# Patient Record
Sex: Female | Born: 1960
Health system: Southern US, Community
[De-identification: ages and names within clinical notes are randomized; demographics above are authoritative.]

## PROBLEM LIST (undated history)

## (undated) DIAGNOSIS — I10 Essential (primary) hypertension: Secondary | ICD-10-CM

## (undated) DIAGNOSIS — C801 Malignant (primary) neoplasm, unspecified: Secondary | ICD-10-CM

## (undated) DIAGNOSIS — O09899 Supervision of other high risk pregnancies, unspecified trimester: Secondary | ICD-10-CM

## (undated) HISTORY — DX: Malignant (primary) neoplasm, unspecified: C80.1

## (undated) HISTORY — PX: BREAST BIOPSY: SHX20

## (undated) HISTORY — DX: Essential (primary) hypertension: I10

## (undated) HISTORY — PX: CATARACT EXTRACTION BILATERAL W/ ANTERIOR VITRECTOMY: SHX1304

## (undated) HISTORY — PX: CHOLECYSTECTOMY: SHX55

## (undated) HISTORY — DX: Supervision of other high risk pregnancies, unspecified trimester: O09.899

---

## 1996-06-24 HISTORY — PX: BREAST EXCISIONAL BIOPSY: SUR124

## 2000-06-24 DIAGNOSIS — O09899 Supervision of other high risk pregnancies, unspecified trimester: Secondary | ICD-10-CM

## 2000-06-24 HISTORY — DX: Supervision of other high risk pregnancies, unspecified trimester: O09.899

## 2000-07-14 ENCOUNTER — Inpatient Hospital Stay (HOSPITAL_COMMUNITY): Admission: AD | Admit: 2000-07-14 | Discharge: 2000-07-18 | Payer: Self-pay | Admitting: *Deleted

## 2000-07-19 ENCOUNTER — Encounter: Admission: RE | Admit: 2000-07-19 | Discharge: 2000-10-02 | Payer: Self-pay | Admitting: *Deleted

## 2003-06-25 HISTORY — PX: BREAST BIOPSY: SHX20

## 2004-06-12 ENCOUNTER — Ambulatory Visit: Payer: Self-pay | Admitting: Unknown Physician Specialty

## 2004-06-24 DIAGNOSIS — C801 Malignant (primary) neoplasm, unspecified: Secondary | ICD-10-CM

## 2004-06-24 HISTORY — DX: Malignant (primary) neoplasm, unspecified: C80.1

## 2005-01-25 ENCOUNTER — Ambulatory Visit: Payer: Self-pay | Admitting: Surgery

## 2005-12-30 ENCOUNTER — Ambulatory Visit: Payer: Self-pay | Admitting: Unknown Physician Specialty

## 2006-02-04 ENCOUNTER — Ambulatory Visit: Payer: Self-pay | Admitting: Surgery

## 2007-02-06 ENCOUNTER — Ambulatory Visit: Payer: Self-pay | Admitting: Unknown Physician Specialty

## 2007-02-19 ENCOUNTER — Ambulatory Visit: Payer: Self-pay | Admitting: Surgery

## 2007-10-09 ENCOUNTER — Ambulatory Visit: Payer: Self-pay | Admitting: Unknown Physician Specialty

## 2008-02-23 ENCOUNTER — Ambulatory Visit: Payer: Self-pay | Admitting: Surgery

## 2008-07-13 ENCOUNTER — Ambulatory Visit: Payer: Self-pay | Admitting: Unknown Physician Specialty

## 2008-07-22 ENCOUNTER — Ambulatory Visit: Payer: Self-pay | Admitting: Unknown Physician Specialty

## 2008-10-21 ENCOUNTER — Ambulatory Visit: Payer: Self-pay | Admitting: Unknown Physician Specialty

## 2009-02-24 ENCOUNTER — Ambulatory Visit: Payer: Self-pay | Admitting: Unknown Physician Specialty

## 2009-10-04 ENCOUNTER — Ambulatory Visit: Payer: Self-pay | Admitting: Unknown Physician Specialty

## 2010-03-01 ENCOUNTER — Ambulatory Visit: Payer: Self-pay | Admitting: Unknown Physician Specialty

## 2010-11-06 LAB — HM PAP SMEAR: HM Pap smear: NORMAL

## 2011-03-14 ENCOUNTER — Ambulatory Visit: Payer: Self-pay | Admitting: Unknown Physician Specialty

## 2011-03-21 LAB — HM MAMMOGRAPHY: HM Mammogram: NORMAL

## 2011-11-14 ENCOUNTER — Ambulatory Visit (INDEPENDENT_AMBULATORY_CARE_PROVIDER_SITE_OTHER): Payer: PRIVATE HEALTH INSURANCE | Admitting: Internal Medicine

## 2011-11-14 ENCOUNTER — Encounter: Payer: Self-pay | Admitting: Internal Medicine

## 2011-11-14 VITALS — BP 136/96 | HR 100 | Temp 98.5°F | Resp 16 | Ht 63.5 in | Wt 189.5 lb

## 2011-11-14 DIAGNOSIS — C801 Malignant (primary) neoplasm, unspecified: Secondary | ICD-10-CM

## 2011-11-14 DIAGNOSIS — I10 Essential (primary) hypertension: Secondary | ICD-10-CM

## 2011-11-14 DIAGNOSIS — E669 Obesity, unspecified: Secondary | ICD-10-CM

## 2011-11-14 NOTE — Patient Instructions (Signed)
Consider the Low Glycemic Index Diet and 6 smaller meals daily .  This boosts your metabolism and regulates your sugars:   7 AM Low carbohydrate Protein  Shakes (EAS Carb Control  Or Atkins ,  Available everywhere,   In  cases at BJs )  2.5 carbs  (Add or substitute a toasted sandwhich thin w/ peanut butter)  10 AM: Protein bar by Atkins (snack size,  Chocolate lover's variety at  BJ's)    Lunch: sandwich on pita bread or flatbread (Joseph's makes a pita bread and a flat bread , available at [employer] and BJ's; Toufayah makes a low carb flatbread available at [employer] and [employer]) Mission makes a low carb whole wheat tortilla available at BJs,and most grocery stores   3 PM:  Mid day :  Another protein bar,  Or a  cheese stick, 1/4 cup of almonds, walnuts, pistachios, pecans, peanuts,  Macadamia nuts  6 PM  Dinner:  "mean and green:"  Meat/chicken/fish, salad, and green veggie : use ranch, vinagrette,  Blue cheese, etc  9 PM snack : Breyer's low carb fudgsicle or  ice cream bar (Carb Smart), or  Weight Watcher's ice cream bar , or another protein shake  

## 2011-11-14 NOTE — Progress Notes (Signed)
Patient ID: Kathryn Torres, female   DOB: 02-28-1961, 51 y.o.   MRN: [id number]  Patient Active Problem List  Diagnoses  . Previous pregnancy with hemolysis, elevated liver enzymes, and low platelet (HELLP) syndrome, antepartum  . Hypertension  . melanoma  . Obesity (BMI 30-39.9)    Subjective:  CC:   Chief Complaint  Patient presents with  . New Patient    HPI:   Kathryn Torres a 51 y.o. female who presents To establish care as a new patient.  Last physical may 2012 with Francia Greaves,  PAP was normal.   Mammogram due in Sept 2012 at [facility].  6 month follow up for hypertension last Nov  .  Cc is  post nasal drip causing a cough,  Last 3 weeks. 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  No history of asthma,  Reflux or smoking.  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   Diagnosed with hypertension in 2002, delivered at 30 weeks for HELLP syndrome  At [facility].  c section .  Checks bp at work and typically runs 120-130/70-82 .  Having periods every 21 days for the past 3 years.  Trial of progesterone only did not help,  prefers to stay hormone free,    Past Medical History  Diagnosis Date  . Previous pregnancy with hemolysis, elevated liver enzymes, and low platelet (HELLP) syndrome, antepartum 2002    s/p c section   . Hypertension   . melanoma 2006    lower back    Past Surgical History  Procedure Date  . Cesarean section 2002         The following portions of the patient's history were reviewed and updated as appropriate: Allergies, current medications, and problem list.    Review of Systems:   12 Pt  review of systems was negative except those addressed in the HPI,     History   Social History  . Marital Status: Married    Spouse Name: N/A    Number of Children: N/A  . Years of Education: N/A   Occupational History  . Not on file.   Social History Main Topics  . Smoking status: Never  Smoker   . Smokeless tobacco: Never Used  . Alcohol Use: No  . Drug Use: No  . Sexually Active: Not on file   Other Topics Concern  . Not on file   Social History Narrative  . No narrative on file    Objective:  BP 136/96  Pulse 100  Temp(Src) 98.5 F (36.9 C) (Oral)  Resp 16  Ht 5' 3.5" (1.613 m)  Wt 189 lb 8 oz (85.957 kg)  BMI 33.04 kg/m2  SpO2 96%  LMP 10/23/2011  General appearance: alert, cooperative and appears stated age Ears: normal TM's and external ear canals both ears Throat: lips, mucosa, and tongue normal; teeth and gums normal Neck: no adenopathy, no carotid bruit, supple, symmetrical, trachea midline and thyroid not enlarged, symmetric, no tenderness/mass/nodules Back: symmetric, no curvature. ROM normal. No CVA tenderness. Lungs: clear to auscultation bilaterally Heart: regular rate and rhythm, S1, S2 normal, no murmur, click, rub or gallop Abdomen: soft, non-tender; bowel sounds normal; no masses,  no organomegaly Pulses: 2+ and symmetric Skin: Skin color, texture, turgor normal. No rashes or lesions Lymph nodes: Cervical, supraclavicular, and axillary nodes normal.  Assessment and Plan:  Hypertension Not currently well controlled, but her readings at  work and home have been < 130/80.  No changes today  melanoma Continues to have biannual dermatology follow up with Diona Browner.   Obesity (BMI 30-39.9) I have addressed  BMI and recommended a low glycemic index diet utilizing smaller more frequent meals to increase metabolism.  I have also recommended that patient start exercising with a goal of 30 minutes of aerobic exercise a minimum of 5 days per week. Screening for lipid disorders, thyroid and diabetes to be done today.       Updated Medication List Outpatient Encounter Prescriptions as of 11/14/2011  Medication Sig Dispense Refill  . metoprolol succinate (TOPROL-XL) 25 MG 24 hr tablet Take 25 mg by mouth daily.      Marland Kitchen  triamterene-hydrochlorothiazide (MAXZIDE-25) 37.5-25 MG per tablet Take 1 tablet by mouth daily.         Orders Placed This Encounter  Procedures  . HM MAMMOGRAPHY  . HM PAP SMEAR    No Follow-up on file.

## 2011-11-15 ENCOUNTER — Telehealth: Payer: Self-pay | Admitting: Internal Medicine

## 2011-11-15 NOTE — Telephone Encounter (Signed)
Do not find this information in 05.23.13 OV Note; Please advise.

## 2011-11-15 NOTE — Telephone Encounter (Signed)
[facility] called and states boyfriend of patient was there to pick up medication that was suppose to be called in yesterday.  Please contact them with the prescriptions.  Pharmacy didn't know what the medication was suppose to be.

## 2011-11-18 ENCOUNTER — Encounter: Payer: Self-pay | Admitting: Internal Medicine

## 2011-11-18 DIAGNOSIS — I10 Essential (primary) hypertension: Secondary | ICD-10-CM | POA: Insufficient documentation

## 2011-11-18 DIAGNOSIS — Z85820 Personal history of malignant melanoma of skin: Secondary | ICD-10-CM | POA: Insufficient documentation

## 2011-11-18 DIAGNOSIS — E669 Obesity, unspecified: Secondary | ICD-10-CM | POA: Insufficient documentation

## 2011-11-18 DIAGNOSIS — O09899 Supervision of other high risk pregnancies, unspecified trimester: Secondary | ICD-10-CM | POA: Insufficient documentation

## 2011-11-18 NOTE — Assessment & Plan Note (Signed)
Continues to have biannual dermatology follow up with Diona Browner.

## 2011-11-18 NOTE — Assessment & Plan Note (Signed)
Not currently well controlled, but her readings at work and home have been < 130/80.  No changes today

## 2011-11-18 NOTE — Assessment & Plan Note (Signed)
I have addressed  BMI and recommended a low glycemic index diet utilizing smaller more frequent meals to increase metabolism.  I have also recommended that patient start exercising with a goal of 30 minutes of aerobic exercise a minimum of 5 days per week. Screening for lipid disorders, thyroid and diabetes to be done today.   

## 2011-11-19 NOTE — Telephone Encounter (Signed)
I do not know either, ,  Please call patient and ask her if we discussed an antibiotic

## 2011-11-22 NOTE — Telephone Encounter (Signed)
This was [place] requested from the pharmacy and the patient has not contacted the office for the medication.

## 2011-11-29 ENCOUNTER — Telehealth: Payer: Self-pay | Admitting: Internal Medicine

## 2011-11-29 MED ORDER — METOPROLOL SUCCINATE ER 25 MG PO TB24: 25.0000 mg | ORAL_TABLET | Freq: Every day | ORAL | Status: DC

## 2011-11-29 MED ORDER — TRIAMTERENE-HCTZ 37.5-25 MG PO TABS: 1.0000 | ORAL_TABLET | Freq: Every day | ORAL | Status: DC

## 2011-11-29 NOTE — Telephone Encounter (Signed)
Patient needing a refill on her Toprol-xl 25 mg and Maxzide 25 37.5-25 mg both in generic.

## 2012-03-02 ENCOUNTER — Telehealth: Payer: Self-pay | Admitting: Internal Medicine

## 2012-03-02 DIAGNOSIS — Z1239 Encounter for other screening for malignant neoplasm of breast: Secondary | ICD-10-CM

## 2012-03-02 NOTE — Telephone Encounter (Signed)
°  Pt called she received notice time for mammogram @ [place] last one was sept 20,2012 Pt would perfer am appointment

## 2012-03-24 ENCOUNTER — Ambulatory Visit: Payer: Self-pay | Admitting: Internal Medicine

## 2012-03-31 ENCOUNTER — Encounter: Payer: Self-pay | Admitting: Internal Medicine

## 2012-04-14 ENCOUNTER — Telehealth: Payer: Self-pay | Admitting: Internal Medicine

## 2012-04-14 NOTE — Telephone Encounter (Signed)
Request for metoprolol succ er 25 mg Sig: take 1 tablet (25 mg total) by mouth daily

## 2012-04-15 MED ORDER — METOPROLOL SUCCINATE ER 25 MG PO TB24: 25.0000 mg | ORAL_TABLET | Freq: Every day | ORAL | Status: DC

## 2012-04-15 NOTE — Telephone Encounter (Signed)
rx sent electronically. 

## 2012-05-15 ENCOUNTER — Ambulatory Visit: Payer: Self-pay | Admitting: Internal Medicine

## 2012-05-15 LAB — BASIC METABOLIC PANEL
BUN: 15 mg/dL (ref 7–18)
Chloride: 104 mmol/L (ref 98–107)
Co2: 29 mmol/L (ref 21–32)
Creatinine: 0.82 mg/dL (ref 0.60–1.30)
EGFR (African American): >60
Potassium: 4.1 mmol/L (ref 3.5–5.1)
Sodium: 139 mmol/L (ref 136–145)

## 2012-05-17 ENCOUNTER — Telehealth: Payer: Self-pay | Admitting: Internal Medicine

## 2012-05-17 NOTE — Telephone Encounter (Signed)
Basic metabolic panel is normal. Her triamterene HCTZ can be refilled.

## 2012-05-18 ENCOUNTER — Ambulatory Visit (INDEPENDENT_AMBULATORY_CARE_PROVIDER_SITE_OTHER): Payer: PRIVATE HEALTH INSURANCE | Admitting: Internal Medicine

## 2012-05-18 ENCOUNTER — Encounter: Payer: Self-pay | Admitting: Internal Medicine

## 2012-05-18 ENCOUNTER — Other Ambulatory Visit (HOSPITAL_COMMUNITY)
Admission: RE | Admit: 2012-05-18 | Discharge: 2012-05-18 | Disposition: A | Discharge status: Home / Self Care | Payer: PRIVATE HEALTH INSURANCE | Source: Ambulatory Visit | Attending: Internal Medicine | Admitting: Internal Medicine

## 2012-05-18 VITALS — BP 112/70 | HR 73 | Temp 98.7°F | Resp 12 | Ht 63.5 in | Wt 192.5 lb

## 2012-05-18 DIAGNOSIS — Z0001 Encounter for general adult medical examination with abnormal findings: Secondary | ICD-10-CM | POA: Insufficient documentation

## 2012-05-18 DIAGNOSIS — Z124 Encounter for screening for malignant neoplasm of cervix: Secondary | ICD-10-CM

## 2012-05-18 DIAGNOSIS — R1011 Right upper quadrant pain: Secondary | ICD-10-CM

## 2012-05-18 DIAGNOSIS — E669 Obesity, unspecified: Secondary | ICD-10-CM

## 2012-05-18 DIAGNOSIS — I10 Essential (primary) hypertension: Secondary | ICD-10-CM

## 2012-05-18 DIAGNOSIS — Z1151 Encounter for screening for human papillomavirus (HPV): Secondary | ICD-10-CM | POA: Insufficient documentation

## 2012-05-18 DIAGNOSIS — Z01419 Encounter for gynecological examination (general) (routine) without abnormal findings: Secondary | ICD-10-CM | POA: Insufficient documentation

## 2012-05-18 DIAGNOSIS — Z9049 Acquired absence of other specified parts of digestive tract: Secondary | ICD-10-CM | POA: Insufficient documentation

## 2012-05-18 DIAGNOSIS — Z0000 Encounter for general adult medical examination without abnormal findings: Secondary | ICD-10-CM

## 2012-05-18 DIAGNOSIS — K8020 Calculus of gallbladder without cholecystitis without obstruction: Secondary | ICD-10-CM

## 2012-05-18 NOTE — Progress Notes (Signed)
Patient ID: Kathryn Torres, female   DOB: 29-Jan-1961, 51 y.o.   MRN: [id number]  Subjective:     Kathryn Torres is a 51 y.o. female and is here for a comprehensive physical exam. The patient reports dismay that she has gained 3 lbs since last visit. 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  Not exercising or dieting currently due to hectic schedule and increased workload at [facility] [employer]. Typical schedule: she rises at 5:00,  in bed by 9:30 or 10:00 .  Has a husband and 45 yr old at home.  2) Recurrent severe post prandial pain, occurring every 3 weeks, more severe lately.  Has a history of gallstones by prior CT of abdomen done by former PCP Lin Givens.  ts to see Michela Pitcher to consider elective surgery.    History   Social History  . Marital Status: Married    Spouse Name: N/A    Number of Children: N/A  . Years of Education: N/A   Occupational History  . Not on file.   Social History Main Topics  . Smoking status: Never Smoker   . Smokeless tobacco: Never Used  . Alcohol Use: No  . Drug Use: No  . Sexually Active: Not on file   Other Topics Concern  . Not on file   Social History Narrative  . No narrative on file   Health Maintenance  Topic Date Due  . Influenza Vaccine  02/23/2012  . Pap Smear  11/05/2013  . Tetanus/tdap  11/05/2015    The following portions of the patient's history were reviewed and updated as appropriate: allergies, current medications, past family history, past medical history, past social history, past surgical history and problem list.  Review of Systems A comprehensive review of systems was negative.   Objective:   BP 112/70  Pulse 73  Temp 98.7 F (37.1 C) (Oral)  Resp 12  Ht 5' 3.5" (1.613 m)  Wt 192 lb 8 oz (87.317 kg)  BMI 33.56 kg/m2  SpO2 98%  LMP 05/04/2012  General Appearance:    Alert, cooperative, no distress, appears stated age  Head:    Normocephalic, without  obvious abnormality, atraumatic  Eyes:    PERRL, conjunctiva/corneas clear, EOM's intact, fundi    benign, both eyes  Ears:    Normal TM's and external ear canals, both ears  Nose:   Nares normal, septum midline, mucosa normal, no drainage    or sinus tenderness  Throat:   Lips, mucosa, and tongue normal; teeth and gums normal  Neck:   Supple, symmetrical, trachea midline, no adenopathy;    thyroid:  no enlargement/tenderness/nodules; no carotid   bruit or JVD  Back:     Symmetric, no curvature, ROM normal, no CVA tenderness  Lungs:     Clear to auscultation bilaterally, respirations unlabored  Chest Wall:    No tenderness or deformity   Heart:    Regular rate and rhythm, S1 and S2 normal, no murmur, rub   or gallop  Breast Exam:    No tenderness, masses, or nipple abnormality  Abdomen:     Soft, non-tender, bowel sounds active all four quadrants,    no masses, no organomegaly  Genitalia:    Normal female without lesion, discharge or tenderness  Extremities:   Extremities normal, atraumatic, no cyanosis or edema  Pulses:   2+ and symmetric all extremities  Skin:   Skin color, texture, turgor normal, no rashes or lesions  Lymph nodes:   Cervical, supraclavicular, and axillary nodes normal  Neurologic:   CNII-XII intact, normal strength, sensation and reflexes    throughout        Assessment:   Symptomatic cholelithiasis She has known cholelithiasis from prior CT of the abdomen and pelvis done last year.. She is having recurrent attacks of postprandial right upper quadrant pain occurring every several weeks and resolving spontaneously. Marland Kitchen Ultrasound has been ordered and referral to Dr. Michela Pitcher for evaluation.  Obesity (BMI 30-39.9) I have addressed  BMI and recommended a low glycemic index diet utilizing smaller more frequent meals to increase metabolism.  I have also recommended that patient start exercising with a goal of 30 minutes of aerobic exercise a minimum of 5 days per week.    Hypertension Well controlled on current regimen. Renal function stable, no changes today.   Routine general medical examination at a health care facility Breast pelvic and PAP were done today    Updated Medication List Outpatient Encounter Prescriptions as of 05/18/2012  Medication Sig Dispense Refill  . metoprolol succinate (TOPROL-XL) 25 MG 24 hr tablet Take 1 tablet (25 mg total) by mouth daily.  30 tablet  12  . triamterene-hydrochlorothiazide (MAXZIDE-25) 37.5-25 MG per tablet Take 1 each (1 tablet total) by mouth daily.  30 tablet  3

## 2012-05-18 NOTE — Telephone Encounter (Signed)
Patient was in office today and given lab results.

## 2012-05-18 NOTE — Assessment & Plan Note (Signed)
Breast pelvic and PAP were done today

## 2012-05-18 NOTE — Assessment & Plan Note (Signed)
I have addressed  BMI and recommended a low glycemic index diet utilizing smaller more frequent meals to increase metabolism.  I have also recommended that patient start exercising with a goal of 30 minutes of aerobic exercise a minimum of 5 days per week.  

## 2012-05-18 NOTE — Patient Instructions (Addendum)
This is  my version of a  "Low GI"  Diet:  All of the foods can be found at grocery stores and in bulk at [employer]Js  [employer].  The Atkins protein bars and shakes are available in more varieties at Target, WalMart and [employer].     7 AM Breakfast:  Low carbohydrate Protein  Shakes (I recommend the EAS AdvantEdge "Carb Control" shakes  Or the low carb shakes by Atkins.   Both are available everywhere:  In  cases at BJs  Or in 4 packs at grocery stores and pharmacies  2.5 carbs  (Alternative is  a toasted Arnold's Sandwhich Thin w/ peanut butter, a "Bagel Thin" with cream cheese and salmon) or  a scrambled egg burrito made with a low carb tortilla .  Avoid cereal and bananas, oatmeal too unless you are cooking the old fashioned kind that takes 30-40 minutes to prepare.  the rest is overly processed, has minimal fiber, and is loaded with carbohydrates!   10 AM: Protein bar by Atkins (the snack size, under 200 cal).  There are many varieties , available widely again or in bulk in limited varieties at BJs)  Other so called "protein bars" tend to be loaded with carbohydrates.  Remember, in food advertising, the word "energy" is synonymous for " carbohydrate."  Lunch: sandwich of turkey, (or any lunchmeat, grilled meat or canned tuna), fresh avocado, mayonnaise  and cheese on a lower carbohydrate pita bread, flatbread, or tortilla . Ok to use regular mayonnaise. The bread is the only source or carbohydrate that can be decreased (Joseph's makes a pita bread and a flat bread that are 50 cal and 4 net carbs ; Toufayan makes a low carb flatbread that's 100 cal and 9 net carbs  and  Mission makes a low carb whole wheat tortilla  That is 210 cal and 6 net carbs)  3 PM:  Mid day :  Another protein bar,  Or a  cheese stick (100 cal, 0 carbs),  Or 1 ounce of  almonds, walnuts, pistachios, pecans, peanuts,  Macadamia nuts. Or a Dannon light n Fit greek yogurt, 80 cal 8 net carbs . Avoid "granola"; the dried cranberries and  raisins are loaded with carbohydrates. Mixed nuts ok if no raisins or cranberries or dried fruit.      6 PM  Dinner:  "mean and green:"  Meat/chicken/fish or a high protein legume; , with a green salad, and a low GI  Veggie (broccoli, cauliflower, green beans, spinach, brussel sprouts. Lima beans) : Avoid "Low fat dressings, as well as [place] and [place]! They are loaded with sugar! Instead use ranch, vinagrette,  Blue cheese, etc.  There is a low carb pasta by Dreamfield's available at Lowe's grocery that is acceptable and tastes great. Try Michel Angel's chicken piccata over low carb pasta. The chicken dish is 0 carbs, and can be found in frozen section at BJs and Lowe's. Also try Aaron Sanchez's "Carnitas" (pulled pork, no sauce,  0 carbs) and his pot roast.   both are in the refrigerated section at BJs   9 PM snack : Breyer's "low carb" fudgsicle or  ice cream bar (Carb Smart line), or  Weight Watcher's ice cream bar , or another "no sugar added" ice cream;a serving of fresh berries/cherries with whipped cream (Avoid bananas, pineapple, grapes  and watermelon on a regular basis because they are high in sugar)   Remember that snack Substitutions should be less than 15 to   20 carbs  Per serving. Remember to subtract fiber grams and sugar alcohols to get the "net carbs."   

## 2012-05-18 NOTE — Assessment & Plan Note (Addendum)
She has known cholelithiasis from prior CT of the abdomen and pelvis done last year.. She is having recurrent attacks of postprandial right upper quadrant pain occurring every several weeks and resolving spontaneously. Marland Kitchen Ultrasound has been ordered and referral to Dr. Michela Pitcher for evaluation.

## 2012-05-18 NOTE — Assessment & Plan Note (Signed)
Well controlled on current regimen. Renal function stable, no changes today. 

## 2012-05-25 ENCOUNTER — Encounter: Payer: Self-pay | Admitting: Internal Medicine

## 2012-05-26 ENCOUNTER — Ambulatory Visit: Payer: Self-pay | Admitting: Internal Medicine

## 2012-05-27 ENCOUNTER — Telehealth: Payer: Self-pay | Admitting: Internal Medicine

## 2012-05-27 NOTE — Telephone Encounter (Signed)
Ultrasound is positive for gallstones.  Has seh been contacted yet by Kathryn Torres with surgery referral appt?

## 2012-05-27 NOTE — Telephone Encounter (Signed)
Patient is aware of test results and surgery is scheduled for 12/12 with [facility]

## 2012-06-08 ENCOUNTER — Encounter: Payer: Self-pay | Admitting: Internal Medicine

## 2013-03-25 ENCOUNTER — Ambulatory Visit: Payer: Self-pay | Admitting: Internal Medicine

## 2013-04-13 ENCOUNTER — Encounter: Payer: Self-pay | Admitting: Internal Medicine

## 2013-04-29 ENCOUNTER — Other Ambulatory Visit: Payer: Self-pay

## 2013-05-19 ENCOUNTER — Other Ambulatory Visit: Payer: Self-pay | Admitting: Internal Medicine

## 2013-05-19 MED ORDER — METOPROLOL SUCCINATE ER 25 MG PO TB24: 25.0000 mg | ORAL_TABLET | Freq: Every day | ORAL | Status: DC

## 2013-05-19 NOTE — Telephone Encounter (Signed)
Escribed

## 2013-05-24 ENCOUNTER — Ambulatory Visit (INDEPENDENT_AMBULATORY_CARE_PROVIDER_SITE_OTHER): Payer: 59 | Admitting: Internal Medicine

## 2013-05-24 ENCOUNTER — Encounter: Payer: Self-pay | Admitting: Internal Medicine

## 2013-05-24 VITALS — BP 144/90 | HR 75 | Temp 98.2°F | Resp 14 | Ht 63.5 in | Wt 197.0 lb

## 2013-05-24 DIAGNOSIS — E669 Obesity, unspecified: Secondary | ICD-10-CM

## 2013-05-24 DIAGNOSIS — K8020 Calculus of gallbladder without cholecystitis without obstruction: Secondary | ICD-10-CM

## 2013-05-24 DIAGNOSIS — I10 Essential (primary) hypertension: Secondary | ICD-10-CM

## 2013-05-24 DIAGNOSIS — E785 Hyperlipidemia, unspecified: Secondary | ICD-10-CM

## 2013-05-24 DIAGNOSIS — R5381 Other malaise: Secondary | ICD-10-CM

## 2013-05-24 DIAGNOSIS — Z1211 Encounter for screening for malignant neoplasm of colon: Secondary | ICD-10-CM

## 2013-05-24 DIAGNOSIS — E559 Vitamin D deficiency, unspecified: Secondary | ICD-10-CM

## 2013-05-24 DIAGNOSIS — C801 Malignant (primary) neoplasm, unspecified: Secondary | ICD-10-CM

## 2013-05-24 DIAGNOSIS — Z0000 Encounter for general adult medical examination without abnormal findings: Secondary | ICD-10-CM

## 2013-05-24 LAB — COMPREHENSIVE METABOLIC PANEL
ALT: 16 U/L (ref 0–35)
Albumin: 4 g/dL (ref 3.5–5.2)
BUN: 11 mg/dL (ref 6–23)
CO2: 27 mEq/L (ref 19–32)
Calcium: 9.2 mg/dL (ref 8.4–10.5)
Chloride: 107 mEq/L (ref 96–112)
Creatinine, Ser: 0.7 mg/dL (ref 0.4–1.2)
GFR: 93.98 mL/min (ref >60.00)
Potassium: 3.9 mEq/L (ref 3.5–5.1)

## 2013-05-24 LAB — CBC WITH DIFFERENTIAL/PLATELET
Basophils Relative: 0.8 % (ref 0.0–3.0)
Eosinophils Absolute: 0.1 10*3/uL (ref 0.0–0.7)
Eosinophils Relative: 1.6 % (ref 0.0–5.0)
HCT: 38.6 % (ref 36.0–46.0)
Hemoglobin: 13.1 g/dL (ref 12.0–15.0)
Lymphocytes Relative: 26.5 % (ref 12.0–46.0)
MCHC: 34 g/dL (ref 30.0–36.0)
MCV: 81.9 fl (ref 78.0–100.0)
Monocytes Absolute: 0.4 10*3/uL (ref 0.1–1.0)
Neutro Abs: 4.4 10*3/uL (ref 1.4–7.7)
RBC: 4.72 Mil/uL (ref 3.87–5.11)
WBC: 6.8 10*3/uL (ref 4.5–10.5)

## 2013-05-24 LAB — LIPID PANEL
Cholesterol: 170 mg/dL (ref 0–200)
HDL: 44.9 mg/dL (ref >39.00)
LDL Cholesterol: 108 mg/dL — ABNORMAL HIGH (ref 0–99)
Total CHOL/HDL Ratio: 4
VLDL: 17.4 mg/dL (ref 0.0–40.0)

## 2013-05-24 MED ORDER — LOSARTAN POTASSIUM 50 MG PO TABS: 50.0000 mg | ORAL_TABLET | Freq: Every day | ORAL | Status: DC

## 2013-05-24 NOTE — Patient Instructions (Signed)
You had your annual  wellness exam today  We will schedule your 3 D mammogram next October at [facility] and your referral to Dr Bluford Kaufmann after the new year.   I do recommend having the gallbladder out next year.   Please get a repeat BMET one week after the meidcation change (nonfasting)  We will contact you with the bloodwork results

## 2013-05-24 NOTE — Assessment & Plan Note (Signed)
Annual comprehensive exam was done including breast, excluding pelvic and PAP smear. All screenings have been addressed .  

## 2013-05-24 NOTE — Assessment & Plan Note (Signed)
Followed semi annually by Vaughan Sine

## 2013-05-24 NOTE — Assessment & Plan Note (Signed)
changing maxzide to losartan . Baseline bmet today . repeat in one week.

## 2013-05-24 NOTE — Progress Notes (Signed)
Patient ID: Kathryn Torres, female   DOB: 04-04-1961, 52 y.o.   MRN: [id number]  Subjective:     Kathryn Torres is a 52 y.o. female and is here for a comprehensive physical exam. The patient reports no problems.   Insomnia persistent but has adapted. Does not want to try anything .  No snoring report.  Hypertension ,  Doesn't like the maxzide bc it causes frequent urination and she has to travel to [place] several days per week and sit in meetings    History   Social History  . Marital Status: Married    Spouse Name: N/A    Number of Children: N/A  . Years of Education: N/A   Occupational History  . Not on file.   Social History Main Topics  . Smoking status: Never Smoker   . Smokeless tobacco: Never Used  . Alcohol Use: No  . Drug Use: No  . Sexual Activity: Not on file   Other Topics Concern  . Not on file   Social History Narrative  . No narrative on file   Health Maintenance  Topic Date Due  . Colonoscopy  12/22/2012  . Influenza Vaccine  01/22/2013  . Mammogram  03/26/2015  . Pap Smear  05/19/2015  . Tetanus/tdap  11/05/2015    The following portions of the patient's history were reviewed and updated as appropriate: current medications, past family history, past medical history, past social history, past surgical history and problem list.  Review of Systems A comprehensive review of systems was negative.   Objective:   BP 144/90  Pulse 75  Temp(Src) 98.2 F (36.8 C) (Oral)  Resp 14  Ht 5' 3.5" (1.613 m)  Wt 197 lb (89.359 kg)  BMI 34.35 kg/m2  SpO2 99%  LMP 05/22/2013  General Appearance:    Alert, cooperative, no distress, appears stated age  Head:    Normocephalic, without obvious abnormality, atraumatic  Eyes:    PERRL, conjunctiva/corneas clear, EOM's intact, fundi    benign, both eyes  Ears:    Normal TM's and external ear canals, both ears  Nose:   Nares normal, septum midline, mucosa normal, no drainage    or sinus tenderness  Throat:    Lips, mucosa, and tongue normal; teeth and gums normal  Neck:   Supple, symmetrical, trachea midline, no adenopathy;    thyroid:  no enlargement/tenderness/nodules; no carotid   bruit or JVD  Back:     Symmetric, no curvature, ROM normal, no CVA tenderness  Lungs:     Clear to auscultation bilaterally, respirations unlabored  Chest Wall:    No tenderness or deformity   Heart:    Regular rate and rhythm, S1 and S2 normal, no murmur, rub   or gallop  Breast Exam:    No tenderness, masses, or nipple abnormality  Abdomen:     Soft, non-tender, bowel sounds active all four quadrants,    no masses, no organomegaly  Extremities:   Extremities normal, atraumatic, no cyanosis or edema  Pulses:   2+ and symmetric all extremities  Skin:   Skin color, texture, turgor normal, no rashes or lesions  Lymph nodes:   Cervical, supraclavicular, and axillary nodes normal  Neurologic:   CNII-XII intact, normal strength, sensation and reflexes    throughout    Assessment:   Obesity (BMI 30-39.9) Body mass index is 34.35 kg/(m^2).  I have addressed  BMI and recommended a low glycemic index diet utilizing smaller more frequent meals to  increase metabolism.  I have also recommended that patient start exercising with a goal of 30 minutes of aerobic exercise a minimum of 5 days per week. Screening for lipid disorders, thyroid and diabetes to be done today.    Symptomatic cholelithiasis Reviewed ultrasound report with patient. Explained which symptoms she was having reucurently could be GB related and which were more IBS related.  Recommended elective surgery in 2015;   melanoma Followed semi annually by Derm   Routine general medical examination at a health care facility Annual comprehensive exam was done including breast, excluding pelvic and PAP smear. All screenings have been addressed .   Hypertension changing maxzide to losartan . Baseline bmet today . repeat in one week.   A total of 45 minutes  was spent with patient more than half of which was spent in counseling, reviewing records from other prviders and coordination of care.  Updated Medication List Outpatient Encounter Prescriptions as of 05/24/2013  Medication Sig  . metoprolol succinate (TOPROL-XL) 25 MG 24 hr tablet Take 1 tablet (25 mg total) by mouth daily.  . [DISCONTINUED] triamterene-hydrochlorothiazide (MAXZIDE-25) 37.5-25 MG per tablet Take 1 each (1 tablet total) by mouth daily.  Marland Kitchen losartan (COZAAR) 50 MG tablet Take 1 tablet (50 mg total) by mouth daily.

## 2013-05-24 NOTE — Assessment & Plan Note (Signed)
Body mass index is 34.35 kg/(m^2).  I have addressed  BMI and recommended a low glycemic index diet utilizing smaller more frequent meals to increase metabolism.  I have also recommended that patient start exercising with a goal of 30 minutes of aerobic exercise a minimum of 5 days per week. Screening for lipid disorders, thyroid and diabetes to be done today.

## 2013-05-24 NOTE — Assessment & Plan Note (Signed)
Reviewed ultrasound report with patient. Explained which symptoms she was having reucurently could be GB related and which were more IBS related.  Recommended elective surgery in 2015;

## 2013-05-24 NOTE — Progress Notes (Signed)
Pre-visit discussion using our clinic review tool. No additional management support is needed unless otherwise documented below in the visit note.  

## 2013-05-25 ENCOUNTER — Encounter: Payer: Self-pay | Admitting: Internal Medicine

## 2013-05-25 DIAGNOSIS — E559 Vitamin D deficiency, unspecified: Secondary | ICD-10-CM | POA: Insufficient documentation

## 2013-05-25 LAB — VITAMIN D 25 HYDROXY (VIT D DEFICIENCY, FRACTURES): Vit D, 25-Hydroxy: 19 ng/mL — ABNORMAL LOW (ref 30–89)

## 2013-05-25 MED ORDER — ERGOCALCIFEROL 1.25 MG (50000 UT) PO CAPS: 50000.0000 [IU] | ORAL_CAPSULE | ORAL | Status: DC

## 2013-05-25 NOTE — Addendum Note (Signed)
Addended by: Sherlene Shams on: 05/25/2013 06:58 AM   Modules accepted: Orders

## 2013-05-27 ENCOUNTER — Encounter: Payer: Self-pay | Admitting: Internal Medicine

## 2013-05-27 DIAGNOSIS — E559 Vitamin D deficiency, unspecified: Secondary | ICD-10-CM

## 2013-05-27 MED ORDER — ERGOCALCIFEROL 1.25 MG (50000 UT) PO CAPS: 50000.0000 [IU] | ORAL_CAPSULE | ORAL | Status: DC

## 2013-06-10 ENCOUNTER — Other Ambulatory Visit: Payer: Self-pay | Admitting: Internal Medicine

## 2013-06-10 MED ORDER — METOPROLOL SUCCINATE ER 25 MG PO TB24: 25.0000 mg | ORAL_TABLET | Freq: Every day | ORAL | Status: DC

## 2013-06-14 ENCOUNTER — Other Ambulatory Visit: Payer: Self-pay | Admitting: Internal Medicine

## 2013-06-14 LAB — BASIC METABOLIC PANEL
BUN: 11 mg/dL (ref 4–21)
Chloride: 104 mmol/L (ref 98–107)
Creatinine: 0.79 mg/dL (ref 0.60–1.30)
Creatinine: 0.8 mg/dL (ref 0.5–1.1)
EGFR (African American): >60
EGFR (Non-African Amer.): >60
Osmolality: 273 (ref 275–301)
Potassium: 4.3 mmol/L (ref 3.5–5.1)
Sodium: 137 mmol/L (ref 136–145)

## 2013-06-22 ENCOUNTER — Telehealth: Payer: Self-pay | Admitting: Internal Medicine

## 2013-06-22 NOTE — Telephone Encounter (Signed)
Your  electrolytes and renal function are normal. You can continue losartan if you are tolerating it

## 2013-12-01 ENCOUNTER — Ambulatory Visit: Payer: Self-pay | Admitting: Gastroenterology

## 2013-12-22 ENCOUNTER — Other Ambulatory Visit: Payer: Self-pay | Admitting: Internal Medicine

## 2014-05-12 ENCOUNTER — Ambulatory Visit: Payer: Self-pay | Admitting: Internal Medicine

## 2014-05-12 ENCOUNTER — Encounter: Payer: Self-pay | Admitting: *Deleted

## 2014-05-12 LAB — HM MAMMOGRAPHY: HM Mammogram: NEGATIVE

## 2014-05-24 ENCOUNTER — Encounter: Payer: Self-pay | Admitting: Internal Medicine

## 2014-05-26 ENCOUNTER — Encounter: Payer: 59 | Admitting: Internal Medicine

## 2014-05-30 ENCOUNTER — Other Ambulatory Visit: Payer: Self-pay | Admitting: Internal Medicine

## 2014-06-22 ENCOUNTER — Ambulatory Visit (INDEPENDENT_AMBULATORY_CARE_PROVIDER_SITE_OTHER): Payer: 59 | Admitting: Internal Medicine

## 2014-06-22 VITALS — BP 132/72 | HR 82 | Temp 98.6°F | Resp 16 | Ht 63.75 in | Wt 191.5 lb

## 2014-06-22 DIAGNOSIS — R5383 Other fatigue: Secondary | ICD-10-CM

## 2014-06-22 DIAGNOSIS — E785 Hyperlipidemia, unspecified: Secondary | ICD-10-CM

## 2014-06-22 DIAGNOSIS — I10 Essential (primary) hypertension: Secondary | ICD-10-CM

## 2014-06-22 DIAGNOSIS — Z1159 Encounter for screening for other viral diseases: Secondary | ICD-10-CM

## 2014-06-22 DIAGNOSIS — E669 Obesity, unspecified: Secondary | ICD-10-CM

## 2014-06-22 DIAGNOSIS — K8020 Calculus of gallbladder without cholecystitis without obstruction: Secondary | ICD-10-CM

## 2014-06-22 DIAGNOSIS — Z0000 Encounter for general adult medical examination without abnormal findings: Secondary | ICD-10-CM

## 2014-06-22 DIAGNOSIS — E559 Vitamin D deficiency, unspecified: Secondary | ICD-10-CM

## 2014-06-22 DIAGNOSIS — N926 Irregular menstruation, unspecified: Secondary | ICD-10-CM

## 2014-06-22 DIAGNOSIS — R300 Dysuria: Secondary | ICD-10-CM

## 2014-06-22 LAB — CBC WITH DIFFERENTIAL/PLATELET
BASOS PCT: 0.4 % (ref 0.0–3.0)
Basophils Absolute: 0 10*3/uL (ref 0.0–0.1)
EOS ABS: 0.1 10*3/uL (ref 0.0–0.7)
EOS PCT: 1.5 % (ref 0.0–5.0)
HEMATOCRIT: 40.1 % (ref 36.0–46.0)
HEMOGLOBIN: 13.1 g/dL (ref 12.0–15.0)
Lymphocytes Relative: 24.6 % (ref 12.0–46.0)
Lymphs Abs: 1.7 10*3/uL (ref 0.7–4.0)
MCHC: 32.7 g/dL (ref 30.0–36.0)
MCV: 83.4 fl (ref 78.0–100.0)
MONO ABS: 0.5 10*3/uL (ref 0.1–1.0)
Monocytes Relative: 6.8 % (ref 3.0–12.0)
NEUTROS ABS: 4.5 10*3/uL (ref 1.4–7.7)
Neutrophils Relative %: 66.7 % (ref 43.0–77.0)
Platelets: 289 10*3/uL (ref 150.0–400.0)
RBC: 4.81 Mil/uL (ref 3.87–5.11)
RDW: 13.1 % (ref 11.5–15.5)
WBC: 6.8 10*3/uL (ref 4.0–10.5)

## 2014-06-22 LAB — URINALYSIS, ROUTINE W REFLEX MICROSCOPIC
Bilirubin Urine: NEGATIVE
KETONES UR: NEGATIVE
Nitrite: NEGATIVE
Specific Gravity, Urine: 1.025 (ref 1.000–1.030)
Total Protein, Urine: NEGATIVE
URINE GLUCOSE: NEGATIVE
Urobilinogen, UA: 0.2 (ref 0.0–1.0)
pH: 5.5 (ref 5.0–8.0)

## 2014-06-22 LAB — COMPREHENSIVE METABOLIC PANEL
ALT: 15 U/L (ref 0–35)
AST: 16 U/L (ref 0–37)
Albumin: 4.1 g/dL (ref 3.5–5.2)
Alkaline Phosphatase: 88 U/L (ref 39–117)
BUN: 15 mg/dL (ref 6–23)
CO2: 24 mEq/L (ref 19–32)
Calcium: 9.8 mg/dL (ref 8.4–10.5)
Chloride: 109 mEq/L (ref 96–112)
Creatinine, Ser: 0.7 mg/dL (ref 0.4–1.2)
GFR: 92.06 mL/min (ref >60.00)
Glucose, Bld: 90 mg/dL (ref 70–99)
Potassium: 4.4 mEq/L (ref 3.5–5.1)
Sodium: 143 mEq/L (ref 135–145)
Total Bilirubin: 0.5 mg/dL (ref 0.2–1.2)
Total Protein: 7.2 g/dL (ref 6.0–8.3)

## 2014-06-22 LAB — LIPID PANEL
Cholesterol: 194 mg/dL (ref 0–200)
HDL: 48.4 mg/dL (ref >39.00)
LDL Cholesterol: 130 mg/dL — ABNORMAL HIGH (ref 0–99)
NonHDL: 145.6
TRIGLYCERIDES: 79 mg/dL (ref 0.0–149.0)
Total CHOL/HDL Ratio: 4
VLDL: 15.8 mg/dL (ref 0.0–40.0)

## 2014-06-22 LAB — HM COLONOSCOPY: HM Colonoscopy: NORMAL

## 2014-06-22 LAB — TSH: TSH: 1.73 u[IU]/mL (ref 0.35–4.50)

## 2014-06-22 MED ORDER — METOPROLOL SUCCINATE ER 25 MG PO TB24: 25.0000 mg | ORAL_TABLET | Freq: Every day | ORAL | Status: DC

## 2014-06-22 MED ORDER — LOSARTAN POTASSIUM 50 MG PO TABS: ORAL_TABLET | ORAL | Status: DC

## 2014-06-22 NOTE — Assessment & Plan Note (Signed)

## 2014-06-22 NOTE — Progress Notes (Signed)
Pre-visit discussion using our clinic review tool. No additional management support is needed unless otherwise documented below in the visit note.  

## 2014-06-22 NOTE — Patient Instructions (Addendum)
You had your annual  wellness exam today.  We will repeat your PAP smear in 2016,  We will contact you with the bloodwork results  Health Maintenance Adopting a healthy lifestyle and getting preventive care can go a long way to promote health and wellness. Talk with your health care provider about what schedule of regular examinations is right for you. This is a good chance for you to check in with your provider about disease prevention and staying healthy. In between checkups, there are plenty of things you can do on your own. Experts have done a lot of research about which lifestyle changes and preventive measures are most likely to keep you healthy. Ask your health care provider for more information. WEIGHT AND DIET  Eat a healthy diet  Be sure to include plenty of vegetables, fruits, low-fat dairy products, and lean protein.  Do not eat a lot of foods high in solid fats, added sugars, or salt.  Get regular exercise. This is one of the most important things you can do for your health.  Most adults should exercise for at least 150 minutes each week. The exercise should increase your heart rate and make you sweat (moderate-intensity exercise).  Most adults should also do strengthening exercises at least twice a week. This is in addition to the moderate-intensity exercise.  Maintain a healthy weight  Body mass index (BMI) is a measurement that can be used to identify possible weight problems. It estimates body fat based on height and weight. Your health care provider can help determine your BMI and help you achieve or maintain a healthy weight.  For females 78 years of age and older:   A BMI below 18.5 is considered underweight.  A BMI of 18.5 to 24.9 is normal.  A BMI of 25 to 29.9 is considered overweight.  A BMI of 30 and above is considered obese.  Watch levels of cholesterol and blood lipids  You should start having your blood tested for lipids and cholesterol at 51 years of  age, then have this test every 5 years.  You may need to have your cholesterol levels checked more often if:  Your lipid or cholesterol levels are high.  You are older than 51 years of age.  You are at high risk for heart disease.  CANCER SCREENING   Lung Cancer  Lung cancer screening is recommended for adults 35-34 years old who are at high risk for lung cancer because of a history of smoking.  A yearly low-dose CT scan of the lungs is recommended for people who:  Currently smoke.  Have quit within the past 15 years.  Have at least a 30-pack-year history of smoking. A pack year is smoking an average of one pack of cigarettes a day for 1 year.  Yearly screening should continue until it has been 15 years since you quit.  Yearly screening should stop if you develop a health problem that would prevent you from having lung cancer treatment.  Breast Cancer  Practice breast self-awareness. This means understanding how your breasts normally appear and feel.  It also means doing regular breast self-exams. Let your health care provider know about any changes, no matter how small.  If you are in your 20s or 30s, you should have a clinical breast exam (CBE) by a health care provider every 1-3 years as part of a regular health exam.  If you are 31 or older, have a CBE every year. Also consider having a breast  X-ray (mammogram) every year.  If you have a family history of breast cancer, talk to your health care provider about genetic screening.  If you are at high risk for breast cancer, talk to your health care provider about having an MRI and a mammogram every year.  Breast cancer gene (BRCA) assessment is recommended for women who have family members with BRCA-related cancers. BRCA-related cancers include:  Breast.  Ovarian.  Tubal.  Peritoneal cancers.  Results of the assessment will determine the need for genetic counseling and BRCA1 and BRCA2 testing. Cervical  Cancer Routine pelvic examinations to screen for cervical cancer are no longer recommended for nonpregnant women who are considered low risk for cancer of the pelvic organs (ovaries, uterus, and vagina) and who do not have symptoms. A pelvic examination may be necessary if you have symptoms including those associated with pelvic infections. Ask your health care provider if a screening pelvic exam is right for you.   The Pap test is the screening test for cervical cancer for women who are considered at risk.  If you had a hysterectomy for a problem that was not cancer or a condition that could lead to cancer, then you no longer need Pap tests.  If you are older than 65 years, and you have had normal Pap tests for the past 10 years, you no longer need to have Pap tests.  If you have had past treatment for cervical cancer or a condition that could lead to cancer, you need Pap tests and screening for cancer for at least 20 years after your treatment.  If you no longer get a Pap test, assess your risk factors if they change (such as having a new sexual partner). This can affect whether you should start being screened again.  Some women have medical problems that increase their chance of getting cervical cancer. If this is the case for you, your health care provider may recommend more frequent screening and Pap tests.  The human papillomavirus (HPV) test is another test that may be used for cervical cancer screening. The HPV test looks for the virus that can cause cell changes in the cervix. The cells collected during the Pap test can be tested for HPV.  The HPV test can be used to screen women 93 years of age and older. Getting tested for HPV can extend the interval between normal Pap tests from three to five years.  An HPV test also should be used to screen women of any age who have unclear Pap test results.  After 51 years of age, women should have HPV testing as often as Pap tests.  Colorectal  Cancer  This type of cancer can be detected and often prevented.  Routine colorectal cancer screening usually begins at 51 years of age and continues through 51 years of age.  Your health care provider may recommend screening at an earlier age if you have risk factors for colon cancer.  Your health care provider may also recommend using home test kits to check for hidden blood in the stool.  A small camera at the end of a tube can be used to examine your colon directly (sigmoidoscopy or colonoscopy). This is done to check for the earliest forms of colorectal cancer.  Routine screening usually begins at age 84.  Direct examination of the colon should be repeated every 5-10 years through 51 years of age. However, you may need to be screened more often if early forms of precancerous polyps or small  growths are found. Skin Cancer  Check your skin from head to toe regularly.  Tell your health care provider about any new moles or changes in moles, especially if there is a change in a mole's shape or color.  Also tell your health care provider if you have a mole that is larger than the size of a pencil eraser.  Always use sunscreen. Apply sunscreen liberally and repeatedly throughout the day.  Protect yourself by wearing long sleeves, pants, a wide-brimmed hat, and sunglasses whenever you are outside. HEART DISEASE, DIABETES, AND HIGH BLOOD PRESSURE   Have your blood pressure checked at least every 1-2 years. High blood pressure causes heart disease and increases the risk of stroke.  If you are between 74 years and 8 years old, ask your health care provider if you should take aspirin to prevent strokes.  Have regular diabetes screenings. This involves taking a blood sample to check your fasting blood sugar level.  If you are at a normal weight and have a low risk for diabetes, have this test once every three years after 51 years of age.  If you are overweight and have a high risk for  diabetes, consider being tested at a younger age or more often. PREVENTING INFECTION  Hepatitis B  If you have a higher risk for hepatitis B, you should be screened for this virus. You are considered at high risk for hepatitis B if:  You were born in a country where hepatitis B is common. Ask your health care provider which countries are considered high risk.  Your parents were born in a high-risk country, and you have not been immunized against hepatitis B (hepatitis B vaccine).  You have HIV or AIDS.  You use needles to inject street drugs.  You live with someone who has hepatitis B.  You have had sex with someone who has hepatitis B.  You get hemodialysis treatment.  You take certain medicines for conditions, including cancer, organ transplantation, and autoimmune conditions. Hepatitis C  Blood testing is recommended for:  Everyone born from [birth year] through 1963.  Anyone with known risk factors for hepatitis C. Sexually transmitted infections (STIs)  You should be screened for sexually transmitted infections (STIs) including gonorrhea and chlamydia if:  You are sexually active and are younger than 51 years of age.  You are older than 50 years of age and your health care provider tells you that you are at risk for this type of infection.  Your sexual activity has changed since you were last screened and you are at an increased risk for chlamydia or gonorrhea. Ask your health care provider if you are at risk.  If you do not have HIV, but are at risk, it may be recommended that you take a prescription medicine daily to prevent HIV infection. This is called pre-exposure prophylaxis (PrEP). You are considered at risk if:  You are sexually active and do not regularly use condoms or know the HIV status of your partner(s).  You take drugs by injection.  You are sexually active with a partner who has HIV. Talk with your health care provider about whether you are at high risk of  being infected with HIV. If you choose to begin PrEP, you should first be tested for HIV. You should then be tested every 3 months for as long as you are taking PrEP.  PREGNANCY   If you are premenopausal and you may become pregnant, ask your health care provider about preconception counseling.  If you may become pregnant, take 400 to 800 micrograms (mcg) of folic acid every day.  If you want to prevent pregnancy, talk to your health care provider about birth control (contraception). OSTEOPOROSIS AND MENOPAUSE   Osteoporosis is a disease in which the bones lose minerals and strength with aging. This can result in serious bone fractures. Your risk for osteoporosis can be identified using a bone density scan.  If you are 65 years of age or older, or if you are at risk for osteoporosis and fractures, ask your health care provider if you should be screened.  Ask your health care provider whether you should take a calcium or vitamin D supplement to lower your risk for osteoporosis.  Menopause may have certain physical symptoms and risks.  Hormone replacement therapy may reduce some of these symptoms and risks. Talk to your health care provider about whether hormone replacement therapy is right for you.  HOME CARE INSTRUCTIONS   Schedule regular health, dental, and eye exams.  Stay current with your immunizations.   Do not use any tobacco products including cigarettes, chewing tobacco, or electronic cigarettes.  If you are pregnant, do not drink alcohol.  If you are breastfeeding, limit how much and how often you drink alcohol.  Limit alcohol intake to no more than 1 drink per day for nonpregnant women. One drink equals 12 ounces of beer, 5 ounces of wine, or 1 ounces of hard liquor.  Do not use street drugs.  Do not share needles.  Ask your health care provider for help if you need support or information about quitting drugs.  Tell your health care provider if you often feel  depressed.  Tell your health care provider if you have ever been abused or do not feel safe at home. Document Released: 12/24/2010 Document Revised: 10/25/2013 Document Reviewed: 05/12/2013 [employer] Patient Information 2015 [employer]. This information is not intended to replace advice given to you by your health care provider. Make sure you discuss any questions you have with your health care provider.  

## 2014-06-22 NOTE — Assessment & Plan Note (Signed)
I have congratulated her in reduction of   BMI and encouraged  Continued weight loss with goal of 10% of body weigh over the next 6 months using a low glycemic index diet and regular exercise a minimum of 5 days per week.    

## 2014-06-22 NOTE — Progress Notes (Signed)
Patient ID: Kathryn Torres, female   DOB: September 11, 1960, 53 y.o.   MRN: [id number]   Subjective:     Kathryn Torres is a 53 y.o. female and is here for a comprehensive physical exam. The patient reports irregular menses .   Has been having Menses occurring every 3 weeks, for the last year. Menses last about 5 to 7 days.  Her last PAP was normal 2013 . She was last seen a year ago and has lost 6 lbs by reducing consumption of carbonated non diet beverages to one soda daily,  And has been taking the stairs at work.  She does not engage in regular exercise and eats out about twice a week.  Has been having intermittent dysuria on and off for several weeks without hematuri aor flank/back pain ,  Symptoms are episodic, Requesting UA  History   Social History  . Marital Status: Married    Spouse Name: N/A    Number of Children: N/A  . Years of Education: N/A   Occupational History  . Not on file.   Social History Main Topics  . Smoking status: Never Smoker   . Smokeless tobacco: Never Used  . Alcohol Use: No  . Drug Use: No  . Sexual Activity: Not on file   Other Topics Concern  . Not on file   Social History Narrative  . No narrative on file   Health Maintenance  Topic Date Due  . INFLUENZA VACCINE  01/23/2015  . PAP SMEAR  05/25/2015  . TETANUS/TDAP  11/05/2015  . MAMMOGRAM  05/12/2016  . COLONOSCOPY  06/22/2024    The following portions of the patient's history were reviewed and updated as appropriate: allergies, current medications, past family history, past medical history, past social history, past surgical history and problem list.  Review of Systems A comprehensive review of systems was negative.   Objective:   BP 132/72 mmHg  Pulse 82  Temp(Src) 98.6 F (37 C) (Oral)  Resp 16  Ht 5' 3.75" (1.619 m)  Wt 191 lb 8 oz (86.864 kg)  BMI 33.14 kg/m2  SpO2 99%  General appearance: alert, cooperative and appears stated age Head: Normocephalic, without obvious  abnormality, atraumatic Eyes: conjunctivae/corneas clear. PERRL, EOM's intact. Fundi benign. Ears: normal TM's and external ear canals both ears Nose: Nares normal. Septum midline. Mucosa normal. No drainage or sinus tenderness. Throat: lips, mucosa, and tongue normal; teeth and gums normal Neck: no adenopathy, no carotid bruit, no JVD, supple, symmetrical, trachea midline and thyroid not enlarged, symmetric, no tenderness/mass/nodules Lungs: clear to auscultation bilaterally Breasts: normal appearance, no masses or tenderness Heart: regular rate and rhythm, S1, S2 normal, no murmur, click, rub or gallop Abdomen: soft, non-tender; bowel sounds normal; no masses,  no organomegaly Extremities: extremities normal, atraumatic, no cyanosis or edema Pulses: 2+ and symmetric Skin: Skin color, texture, turgor normal. No rashes or lesions Neurologic: Alert and oriented X 3, normal strength and tone. Normal symmetric reflexes. Normal coordination and gait.    Assessment and Plan:   Problem List Items Addressed This Visit    Dysuria    UA was suggestive of possible UTi but her symptoms have been intermittent and mild.  Urine culture suggest infection with E Coli.  Will treat empirically with Septra DS.    Relevant Medications      SEPTRA DS 800-160 MG PO TABS   Other Relevant Orders      Urinalysis, Routine w reflex microscopic (Completed)  Urinalysis, dipstick only      Urine Culture (Completed)   Hypertension    Well controlled on losartan.  . Renal function stable, no changes today.  Lab Results  Component Value Date   CREATININE 0.7 06/22/2014   Lab Results  Component Value Date   NA 143 06/22/2014   K 4.4 06/22/2014   CL 109 06/22/2014   CO2 24 06/22/2014        Relevant Medications      losartan (COZAAR) tablet      metoprolol succinate (TOPROL-XL) 24 hr tablet   Irregular menses    Suspect anovulation secondary to PCOS vs perimenopausal changes.  She has normal thyroid  function and no evidence of diabetes.  Checking FSH, LH,  And pelvic ultrasound to evaluate endometrial stripe.   Lab Results  Component Value Date   TSH 1.73 06/22/2014       Obesity (BMI 30-39.9) (Chronic)    I have congratulated her in reduction of   BMI and encouraged  Continued weight loss with goal of 10% of body weigh over the next 6 months using a low glycemic index diet and regular exercise a minimum of 5 days per week.      Routine general medical examination at a health care facility - Primary    Annual wellness  exam was done as well as a comprehensive physical exam and management of acute and chronic conditions .  During the course of the visit the patient was educated and counseled about appropriate screening and preventive services including :  diabetes screening, lipid analysis with projected  10 year  risk for CAD , nutrition counseling, colorectal cancer screening, and recommended immunizations.  Printed recommendations for health maintenance screenings was given.     Symptomatic cholelithiasis    She has deferred surgery and has not had any recent GC attacks,  Surgeon of choice is Bronson Ing.     Vitamin D deficiency    Treated last year with Drisdol  Repeat D is pending     Other Visit Diagnoses    Hyperlipidemia        Relevant Medications       losartan (COZAAR) tablet       metoprolol succinate (TOPROL-XL) 24 hr tablet    Other Relevant Orders       Lipid panel (Completed)    Other fatigue        Relevant Orders       CBC with Differential (Completed)       Comprehensive metabolic panel (Completed)       TSH (Completed)    Need for hepatitis C screening test        Relevant Orders       Hepatitis C antibody (Completed)    Irregular menstrual cycle        Relevant Orders       US Transvaginal Non-OB

## 2014-06-23 ENCOUNTER — Encounter: Payer: Self-pay | Admitting: Internal Medicine

## 2014-06-23 ENCOUNTER — Telehealth: Payer: Self-pay | Admitting: Internal Medicine

## 2014-06-23 DIAGNOSIS — R300 Dysuria: Secondary | ICD-10-CM | POA: Insufficient documentation

## 2014-06-23 DIAGNOSIS — N926 Irregular menstruation, unspecified: Secondary | ICD-10-CM | POA: Insufficient documentation

## 2014-06-23 LAB — HEPATITIS C ANTIBODY: HCV AB: NEGATIVE

## 2014-06-23 MED ORDER — SULFAMETHOXAZOLE-TRIMETHOPRIM 800-160 MG PO TABS: 1.0000 | ORAL_TABLET | Freq: Two times a day (BID) | ORAL | Status: DC

## 2014-06-23 NOTE — Assessment & Plan Note (Signed)
Well controlled on losartan.  . Renal function stable, no changes today.  Lab Results  Component Value Date   CREATININE 0.7 06/22/2014   Lab Results  Component Value Date   NA 143 06/22/2014   K 4.4 06/22/2014   CL 109 06/22/2014   CO2 24 06/22/2014

## 2014-06-23 NOTE — Telephone Encounter (Signed)
emmi emailed °

## 2014-06-23 NOTE — Assessment & Plan Note (Addendum)
Suspect anovulation secondary to PCOS vs perimenopausal changes.  She has normal thyroid function and no evidence of diabetes.  Checking FSH, LH,  And pelvic ultrasound to evaluate endometrial stripe.   Lab Results  Component Value Date   TSH 1.73 06/22/2014

## 2014-06-23 NOTE — Assessment & Plan Note (Signed)
UA was suggestive of possible UTi but her symptoms have been intermittent and mild.  Urine culture suggest infection with E Coli.  Will treat empirically with Septra DS.

## 2014-06-23 NOTE — Assessment & Plan Note (Signed)
Treated last year with Drisdol  Repeat D is pending

## 2014-06-23 NOTE — Assessment & Plan Note (Signed)
She has deferred surgery and has not had any recent GC attacks,  Surgeon of choice is Bronson Ing.

## 2014-06-24 LAB — URINE CULTURE: Colony Count: >=100000

## 2014-06-30 ENCOUNTER — Ambulatory Visit: Payer: Self-pay | Admitting: Internal Medicine

## 2014-07-01 ENCOUNTER — Encounter: Payer: Self-pay | Admitting: *Deleted

## 2014-07-01 ENCOUNTER — Telehealth: Payer: Self-pay | Admitting: Internal Medicine

## 2014-07-01 NOTE — Telephone Encounter (Signed)
Sent mychart

## 2014-07-01 NOTE — Telephone Encounter (Signed)
Pelvic US showed fibroid uterus, which is the likely cause of her heavy menses. I would recommend evaluation with GYN. I would be happy to place this referral if she is interested in talking with them

## 2014-07-26 ENCOUNTER — Encounter: Payer: Self-pay | Admitting: Internal Medicine

## 2015-04-25 ENCOUNTER — Other Ambulatory Visit: Payer: Self-pay | Admitting: Internal Medicine

## 2015-04-25 NOTE — Telephone Encounter (Signed)
Patient has not been seen since 05/2014 from what I can see. Please advise?

## 2015-04-26 NOTE — Telephone Encounter (Signed)
Refill for 30 days only.  OFFICE VISIT NEEDED and labs prior to any more refills

## 2015-05-10 ENCOUNTER — Other Ambulatory Visit: Payer: Self-pay | Admitting: Internal Medicine

## 2015-05-10 DIAGNOSIS — Z1231 Encounter for screening mammogram for malignant neoplasm of breast: Secondary | ICD-10-CM

## 2015-05-24 ENCOUNTER — Encounter: Payer: Self-pay | Admitting: Internal Medicine

## 2015-05-24 ENCOUNTER — Ambulatory Visit
Admission: RE | Admit: 2015-05-24 | Discharge: 2015-05-24 | Disposition: A | Discharge status: Home / Self Care | Payer: 59 | Source: Ambulatory Visit | Attending: Internal Medicine | Admitting: Internal Medicine

## 2015-05-24 DIAGNOSIS — Z1231 Encounter for screening mammogram for malignant neoplasm of breast: Secondary | ICD-10-CM | POA: Diagnosis present

## 2015-06-28 ENCOUNTER — Other Ambulatory Visit (HOSPITAL_COMMUNITY)
Admission: RE | Admit: 2015-06-28 | Discharge: 2015-06-28 | Disposition: A | Discharge status: Home / Self Care | Payer: 59 | Source: Ambulatory Visit | Attending: Internal Medicine | Admitting: Internal Medicine

## 2015-06-28 ENCOUNTER — Encounter: Payer: Self-pay | Admitting: Internal Medicine

## 2015-06-28 ENCOUNTER — Ambulatory Visit (INDEPENDENT_AMBULATORY_CARE_PROVIDER_SITE_OTHER): Payer: 59 | Admitting: Internal Medicine

## 2015-06-28 VITALS — BP 138/78 | HR 84 | Temp 98.4°F | Resp 12 | Ht 63.75 in | Wt 199.5 lb

## 2015-06-28 DIAGNOSIS — Z01419 Encounter for gynecological examination (general) (routine) without abnormal findings: Secondary | ICD-10-CM | POA: Insufficient documentation

## 2015-06-28 DIAGNOSIS — Z0000 Encounter for general adult medical examination without abnormal findings: Secondary | ICD-10-CM | POA: Diagnosis not present

## 2015-06-28 DIAGNOSIS — E669 Obesity, unspecified: Secondary | ICD-10-CM

## 2015-06-28 DIAGNOSIS — I10 Essential (primary) hypertension: Secondary | ICD-10-CM | POA: Diagnosis not present

## 2015-06-28 DIAGNOSIS — R5383 Other fatigue: Secondary | ICD-10-CM

## 2015-06-28 DIAGNOSIS — E559 Vitamin D deficiency, unspecified: Secondary | ICD-10-CM

## 2015-06-28 DIAGNOSIS — Z124 Encounter for screening for malignant neoplasm of cervix: Secondary | ICD-10-CM

## 2015-06-28 DIAGNOSIS — Z1151 Encounter for screening for human papillomavirus (HPV): Secondary | ICD-10-CM | POA: Insufficient documentation

## 2015-06-28 LAB — VITAMIN D 25 HYDROXY (VIT D DEFICIENCY, FRACTURES): VITD: 13.83 ng/mL — AB (ref 30.00–100.00)

## 2015-06-28 LAB — CBC WITH DIFFERENTIAL/PLATELET
BASOS ABS: 0 10*3/uL (ref 0.0–0.1)
Basophils Relative: 0.6 % (ref 0.0–3.0)
EOS PCT: 1.1 % (ref 0.0–5.0)
Eosinophils Absolute: 0.1 10*3/uL (ref 0.0–0.7)
HCT: 39.5 % (ref 36.0–46.0)
HEMOGLOBIN: 13 g/dL (ref 12.0–15.0)
Lymphocytes Relative: 23.5 % (ref 12.0–46.0)
Lymphs Abs: 1.6 10*3/uL (ref 0.7–4.0)
MCHC: 33 g/dL (ref 30.0–36.0)
MCV: 82.9 fl (ref 78.0–100.0)
MONOS PCT: 7 % (ref 3.0–12.0)
Monocytes Absolute: 0.5 10*3/uL (ref 0.1–1.0)
NEUTROS PCT: 67.8 % (ref 43.0–77.0)
Neutro Abs: 4.6 10*3/uL (ref 1.4–7.7)
Platelets: 286 10*3/uL (ref 150.0–400.0)
RBC: 4.76 Mil/uL (ref 3.87–5.11)
RDW: 13.1 % (ref 11.5–15.5)
WBC: 6.8 10*3/uL (ref 4.0–10.5)

## 2015-06-28 LAB — COMPREHENSIVE METABOLIC PANEL
ALBUMIN: 4 g/dL (ref 3.5–5.2)
ALT: 17 U/L (ref 0–35)
AST: 16 U/L (ref 0–37)
Alkaline Phosphatase: 83 U/L (ref 39–117)
BUN: 13 mg/dL (ref 6–23)
CALCIUM: 9.5 mg/dL (ref 8.4–10.5)
CHLORIDE: 105 meq/L (ref 96–112)
CO2: 28 meq/L (ref 19–32)
Creatinine, Ser: 0.65 mg/dL (ref 0.40–1.20)
GFR: 101.53 mL/min (ref >60.00)
Glucose, Bld: 90 mg/dL (ref 70–99)
POTASSIUM: 4.6 meq/L (ref 3.5–5.1)
Sodium: 140 mEq/L (ref 135–145)
Total Bilirubin: 0.5 mg/dL (ref 0.2–1.2)
Total Protein: 6.9 g/dL (ref 6.0–8.3)

## 2015-06-28 LAB — TSH: TSH: 1.51 u[IU]/mL (ref 0.35–4.50)

## 2015-06-28 LAB — LIPID PANEL
CHOLESTEROL: 181 mg/dL (ref 0–200)
HDL: 57.1 mg/dL (ref >39.00)
LDL CALC: 109 mg/dL — AB (ref 0–99)
NONHDL: 123.88
Total CHOL/HDL Ratio: 3
Triglycerides: 75 mg/dL (ref 0.0–149.0)
VLDL: 15 mg/dL (ref 0.0–40.0)

## 2015-06-28 MED ORDER — LOSARTAN POTASSIUM 50 MG PO TABS: ORAL_TABLET | ORAL | Status: DC

## 2015-06-28 MED ORDER — METOPROLOL SUCCINATE ER 25 MG PO TB24: ORAL_TABLET | ORAL | Status: DC

## 2015-06-28 NOTE — Progress Notes (Signed)
Pre-visit discussion using our clinic review tool. No additional management support is needed unless otherwise documented below in the visit note.  

## 2015-06-28 NOTE — Progress Notes (Signed)
Patient ID: Kathryn Torres, female    DOB: 30-Jan-1961  Age: 55 y.o. MRN: [id number]  The patient is here for annual wellness examination and management of other chronic and acute problems.   Mammogram normal Nov 2015 Colonoscopy normal 2015 PAP smear normal 2016  The risk factors are reflected in the social history.  The roster of all physicians providing medical care to patient - is listed in the Snapshot section of the chart.   Home safety : The patient has smoke detectors in the home. They wear seatbelts.  There are no firearms at home. There is no violence in the home.   There is no risks for hepatitis, STDs or HIV. There is no   history of blood transfusion. They have no travel history to infectious disease endemic areas of the world.  The patient has seen their dentist in the last six month. They have seen their eye doctor in the last year. They admit to slight hearing difficulty with regard to whispered voices and some television programs.  They have deferred audiologic testing in the last year.  They do not  have excessive sun exposure. Discussed the need for sun protection: hats, long sleeves and use of sunscreen if there is significant sun exposure.   Diet: the importance of a healthy diet is discussed. They do have a healthy diet.  The benefits of regular aerobic exercise were discussed. She is not exercising regularly .   Depression screen: there are no signs or vegative symptoms of depression- irritability, change in appetite, anhedonia, sadness/tearfullness.  Cognitive assessment: the patient manages all their financial and personal affairs and is actively engaged. They could relate day,date,year and events; recalled 2/3 objects at 3 minutes; performed clock-face test normally.  The following portions of the patient's history were reviewed and updated as appropriate: allergies, current medications, past family history, past medical history,  past surgical history, past social  history  and problem list.  Visual acuity was not assessed per patient preference since she has regular follow up with her ophthalmologist. Hearing and body mass index were assessed and reviewed.   During the course of the visit the patient was educated and counseled about appropriate screening and preventive services including : fall prevention , diabetes screening, nutrition counseling, colorectal cancer screening, and recommended immunizations.    CC: The primary encounter diagnosis was Essential hypertension. Diagnoses of Other fatigue, Vitamin D deficiency, Screening for cervical cancer, Visit for preventive health examination, and Obesity (BMI 30-39.9) were also pertinent to this visit.  History Kathryn Torres has a past medical history of Previous pregnancy with hemolysis, elevated liver enzymes, and low platelet (HELLP) syndrome, antepartum (2002); Hypertension; and melanoma (2006).   She has past surgical history that includes Cesarean section (2002) and Breast biopsy (Right, 1998 and 2005).   Her family history includes Breast cancer (age of onset: 14) in her mother; Cancer in her father and mother; Deep vein thrombosis in her maternal grandmother; Hypertension in her mother; Stroke (age of onset: 1) in her paternal grandfather.She reports that she has never smoked. She has never used smokeless tobacco. She reports that she does not drink alcohol or use illicit drugs.  Outpatient Prescriptions Prior to Visit  Medication Sig Dispense Refill  . losartan (COZAAR) 50 MG tablet TAKE 1 TABLET (50 MG TOTAL) BY MOUTH DAILY. 30 tablet 0  . metoprolol succinate (TOPROL-XL) 25 MG 24 hr tablet TAKE 1 TABLET (25 MG TOTAL) BY MOUTH DAILY. 30 tablet 0  . ergocalciferol (DRISDOL)  50000 UNITS capsule Take 1 capsule (50,000 Units total) by mouth once a week. (Patient not taking: Reported on 06/22/2014) 12 capsule 0  . sulfamethoxazole-trimethoprim (SEPTRA DS) 800-160 MG per tablet Take 1 tablet by mouth 2 (two)  times daily. 10 tablet 0   No facility-administered medications prior to visit.    Review of Systems  Patient denies headache, fevers, malaise, unintentional weight loss, skin rash, eye pain, sinus congestion and sinus pain, sore throat, dysphagia,  hemoptysis , cough, dyspnea, wheezing, chest pain, palpitations, orthopnea, edema, abdominal pain, nausea, melena, diarrhea, constipation, flank pain, dysuria, hematuria, urinary  Frequency, nocturia, numbness, tingling, seizures,  Focal weakness, Loss of consciousness,  Tremor, insomnia, depression, anxiety, and suicidal ideation.     Objective:  BP 138/78 mmHg  Pulse 84  Temp(Src) 98.4 F (36.9 C) (Oral)  Resp 12  Ht 5' 3.75" (1.619 m)  Wt 199 lb 8 oz (90.493 kg)  BMI 34.52 kg/m2  SpO2 99%  LMP 07/07/2014 (Approximate)  Physical Exam  General Appearance:    Alert, cooperative, no distress, appears stated age  Head:    Normocephalic, without obvious abnormality, atraumatic  Eyes:    PERRL, conjunctiva/corneas clear, EOM's intact, fundi    benign, both eyes  Ears:    Normal TM's and external ear canals, both ears  Nose:   Nares normal, septum midline, mucosa normal, no drainage    or sinus tenderness  Throat:   Lips, mucosa, and tongue normal; teeth and gums normal  Neck:   Supple, symmetrical, trachea midline, no adenopathy;    thyroid:  no enlargement/tenderness/nodules; no carotid   bruit or JVD  Back:     Symmetric, no curvature, ROM normal, no CVA tenderness  Lungs:     Clear to auscultation bilaterally, respirations unlabored  Chest Wall:    No tenderness or deformity   Heart:    Regular rate and rhythm, S1 and S2 normal, no murmur, rub   or gallop  Breast Exam:    No tenderness, masses, or nipple abnormality  Abdomen:     Soft, non-tender, bowel sounds active all four quadrants,    no masses, no organomegaly  Genitalia:    Pelvic: cervix normal in appearance, external genitalia normal, no adnexal masses or tenderness, no  cervical motion tenderness, rectovaginal septum normal, uterus normal size, shape, and consistency and vagina normal without discharge  Extremities:   Extremities normal, atraumatic, no cyanosis or edema  Pulses:   2+ and symmetric all extremities  Skin:   Skin color, texture, turgor normal, no rashes or lesions  Lymph nodes:   Cervical, supraclavicular, and axillary nodes normal  Neurologic:   CNII-XII intact, normal strength, sensation and reflexes    throughout     Assessment & Plan:   Problem List Items Addressed This Visit    Obesity (BMI 30-39.9) (Chronic)    I have addressed  BMI and recommended a low glycemic index diet utilizing smaller more frequent meals to increase metabolism.  I have also recommended that patient start exercising with a goal of 30 minutes of aerobic exercise a minimum of 5 days per week. Screening for lipid disorders, thyroid and diabetes to be done today.        Hypertension - Primary    Well controlled on current regimen. Renal function stable, no changes today.  Lab Results  Component Value Date   CREATININE 0.65 06/28/2015   Lab Results  Component Value Date   NA 140 06/28/2015   K 4.6  06/28/2015   CL 105 06/28/2015   CO2 28 06/28/2015         Relevant Medications   losartan (COZAAR) 50 MG tablet   metoprolol succinate (TOPROL-XL) 25 MG 24 hr tablet   Other Relevant Orders   Comprehensive metabolic panel (Completed)   Lipid panel (Completed)   Visit for preventive health examination    Annual comprehensive preventive exam was done as well as an evaluation and management of acute and chronic conditions .  During the course of the visit the patient was educated and counseled about appropriate screening and preventive services including :  diabetes screening, lipid analysis with projected  10 year  risk for CAD , nutrition counseling, colorectal cancer screening, and recommended immunizations.  Printed recommendations for health maintenance  screenings was given.       Vitamin D deficiency   Relevant Orders   VITAMIN D 25 Hydroxy (Vit-D Deficiency, Fractures) (Completed)    Other Visit Diagnoses    Other fatigue        Relevant Orders    TSH (Completed)    CBC with Differential/Platelet (Completed)    Screening for cervical cancer        Relevant Orders    Cytology - PAP (Completed)       I have discontinued Ms. Hupp's ergocalciferol and sulfamethoxazole-trimethoprim. I am also having her maintain her losartan and metoprolol succinate.  Meds ordered this encounter  Medications  . losartan (COZAAR) 50 MG tablet    Sig: TAKE 1 TABLET (50 MG TOTAL) BY MOUTH DAILY.    Dispense:  90 tablet    Refill:  2  . metoprolol succinate (TOPROL-XL) 25 MG 24 hr tablet    Sig: TAKE 1 TABLET (25 MG TOTAL) BY MOUTH DAILY.    Dispense:  90 tablet    Refill:  2    Medications Discontinued During This Encounter  Medication Reason  . ergocalciferol (DRISDOL) 50000 UNITS capsule Completed Course  . sulfamethoxazole-trimethoprim (SEPTRA DS) 800-160 MG per tablet Completed Course  . losartan (COZAAR) 50 MG tablet Reorder  . metoprolol succinate (TOPROL-XL) 25 MG 24 hr tablet Reorder    Follow-up: Return in about 6 months (around 12/26/2015).   Crecencio Mc, MD

## 2015-06-28 NOTE — Patient Instructions (Signed)
The  diet I discussed with you today is the 10 day Green Smoothie Cleansing /Detox Diet by JJ Smith . available on [employer] for around $10.  This is not a low carb or a weight loss diet,  It is fundamentally a "cleansing" low fat diet that eliminates sugar, gluten, caffeine, alcohol and dairy for 10 days .  What you add back after the initial ten days is entirely up to  you!  You can expect to lose 5 to 10 lbs depending on how strict you are.   I found that  drinking 2 smoothies or juices  daily and keeping one chewable meal (but keep it simple, like baked fish and salad, rice or bok choy) kept me satisfied and kept me from straying  .  You snack primarily on fresh  fruit, egg whites and judicious quantities of nuts. I  Recommend adding a  vegetable based protein powder  To any smoothie made with almond milk.  (nothing with whey , since whey is dairy) in it.  [employer] has a great selection .   It does require some form of a nutrient extractor (Vita Mix, a electric juicer,  Or a Nutribullet Rx).  i have found that using frozen fruits is much more convenient and cost effective. You can even find plenty of organic fruit in the frozen fruit section of [employer].  Just thaw what you need for the following day the night before in the refrigerator (to avoid jamming up your machine)    The organic greens drink I use if I don't have any fresh greens  is called "Suja" and it's sold in the vegetable refrigerated section of most grocery stores (including [employer])  . It is tart, though, so be careful (has lemon juice in it )  The organic vegan protein powder I tried  is called Vega" and I found it at [employer] .  It is sugar free  Menopause is a normal process in which your reproductive ability comes to an end. This process happens gradually over a span of months to years, usually between the ages of 48 and 55. Menopause is complete when you have missed 12 consecutive menstrual periods. It is important to talk with your  health care provider about some of the most common conditions that affect postmenopausal women, such as heart disease, cancer, and bone loss (osteoporosis). Adopting a healthy lifestyle and getting preventive care can help to promote your health and wellness. Those actions can also lower your chances of developing some of these common conditions. WHAT SHOULD I KNOW ABOUT MENOPAUSE? During menopause, you may experience a number of symptoms, such as:  Moderate-to-severe hot flashes.  Night sweats.  Decrease in sex drive.  Mood swings.  Headaches.  Tiredness.  Irritability.  Memory problems.  Insomnia. Choosing to treat or not to treat menopausal changes is an individual decision that you make with your health care provider. WHAT SHOULD I KNOW ABOUT HORMONE REPLACEMENT THERAPY AND SUPPLEMENTS? Hormone therapy products are effective for treating symptoms that are associated with menopause, such as hot flashes and night sweats. Hormone replacement carries certain risks, especially as you become older. If you are thinking about using estrogen or estrogen with progestin treatments, discuss the benefits and risks with your health care provider. WHAT SHOULD I KNOW ABOUT HEART DISEASE AND STROKE? Heart disease, heart attack, and stroke become more likely as you age. This may be due, in part, to the hormonal changes that your body experiences during menopause.   These can affect how your body processes dietary fats, triglycerides, and cholesterol. Heart attack and stroke are both medical emergencies. There are many things that you can do to help prevent heart disease and stroke:  Have your blood pressure checked at least every 1-2 years. High blood pressure causes heart disease and increases the risk of stroke.  If you are 55-79 years old, ask your health care provider if you should take aspirin to prevent a heart attack or a stroke.  Do not use any tobacco products, including cigarettes, chewing  tobacco, or electronic cigarettes. If you need help quitting, ask your health care provider.  It is important to eat a healthy diet and maintain a healthy weight.  Be sure to include plenty of vegetables, fruits, low-fat dairy products, and lean protein.  Avoid eating foods that are high in solid fats, added sugars, or salt (sodium).  Get regular exercise. This is one of the most important things that you can do for your health.  Try to exercise for at least 150 minutes each week. The type of exercise that you do should increase your heart rate and make you sweat. This is known as moderate-intensity exercise.  Try to do strengthening exercises at least twice each week. Do these in addition to the moderate-intensity exercise.  Know your numbers.Ask your health care provider to check your cholesterol and your blood glucose. Continue to have your blood tested as directed by your health care provider. WHAT SHOULD I KNOW ABOUT CANCER SCREENING? There are several types of cancer. Take the following steps to reduce your risk and to catch any cancer development as early as possible. Breast Cancer  Practice breast self-awareness.  This means understanding how your breasts normally appear and feel.  It also means doing regular breast self-exams. Let your health care provider know about any changes, no matter how small.  If you are 40 or older, have a clinician do a breast exam (clinical breast exam or CBE) every year. Depending on your age, family history, and medical history, it may be recommended that you also have a yearly breast X-ray (mammogram).  If you have a family history of breast cancer, talk with your health care provider about genetic screening.  If you are at high risk for breast cancer, talk with your health care provider about having an MRI and a mammogram every year.  Breast cancer (BRCA) gene test is recommended for women who have family members with BRCA-related cancers.  Results of the assessment will determine the need for genetic counseling and BRCA1 and for BRCA2 testing. BRCA-related cancers include these types:  Breast. This occurs in males or females.  Ovarian.  Tubal. This may also be called fallopian tube cancer.  Cancer of the abdominal or pelvic lining (peritoneal cancer).  Prostate.  Pancreatic. Cervical, Uterine, and Ovarian Cancer Your health care provider may recommend that you be screened regularly for cancer of the pelvic organs. These include your ovaries, uterus, and vagina. This screening involves a pelvic exam, which includes checking for microscopic changes to the surface of your cervix (Pap test).  For women ages 21-65, health care providers may recommend a pelvic exam and a Pap test every three years. For women ages 30-65, they may recommend the Pap test and pelvic exam, combined with testing for human papilloma virus (HPV), every five years. Some types of HPV increase your risk of cervical cancer. Testing for HPV may also be done on women of any age who have unclear   Pap test results.  Other health care providers may not recommend any screening for nonpregnant women who are considered low risk for pelvic cancer and have no symptoms. Ask your health care provider if a screening pelvic exam is right for you.  If you have had past treatment for cervical cancer or a condition that could lead to cancer, you need Pap tests and screening for cancer for at least 20 years after your treatment. If Pap tests have been discontinued for you, your risk factors (such as having a new sexual partner) need to be reassessed to determine if you should start having screenings again. Some women have medical problems that increase the chance of getting cervical cancer. In these cases, your health care provider may recommend that you have screening and Pap tests more often.  If you have a family history of uterine cancer or ovarian cancer, talk with your health  care provider about genetic screening.  If you have vaginal bleeding after reaching menopause, tell your health care provider.  There are currently no reliable tests available to screen for ovarian cancer. Lung Cancer Lung cancer screening is recommended for adults 55-80 years old who are at high risk for lung cancer because of a history of smoking. A yearly low-dose CT scan of the lungs is recommended if you:  Currently smoke.  Have a history of at least 30 pack-years of smoking and you currently smoke or have quit within the past 15 years. A pack-year is smoking an average of one pack of cigarettes per day for one year. Yearly screening should:  Continue until it has been 15 years since you quit.  Stop if you develop a health problem that would prevent you from having lung cancer treatment. Colorectal Cancer  This type of cancer can be detected and can often be prevented.  Routine colorectal cancer screening usually begins at age 50 and continues through age 75.  If you have risk factors for colon cancer, your health care provider may recommend that you be screened at an earlier age.  If you have a family history of colorectal cancer, talk with your health care provider about genetic screening.  Your health care provider may also recommend using home test kits to check for hidden blood in your stool.  A small camera at the end of a tube can be used to examine your colon directly (sigmoidoscopy or colonoscopy). This is done to check for the earliest forms of colorectal cancer.  Direct examination of the colon should be repeated every 5-10 years until age 75. However, if early forms of precancerous polyps or small growths are found or if you have a family history or genetic risk for colorectal cancer, you may need to be screened more often. Skin Cancer  Check your skin from head to toe regularly.  Monitor any moles. Be sure to tell your health care provider:  About any new moles  or changes in moles, especially if there is a change in a mole's shape or color.  If you have a mole that is larger than the size of a pencil eraser.  If any of your family members has a history of skin cancer, especially at a young age, talk with your health care provider about genetic screening.  Always use sunscreen. Apply sunscreen liberally and repeatedly throughout the day.  Whenever you are outside, protect yourself by wearing long sleeves, pants, a wide-brimmed hat, and sunglasses. WHAT SHOULD I KNOW ABOUT OSTEOPOROSIS? Osteoporosis is a condition in   which bone destruction happens more quickly than new bone creation. After menopause, you may be at an increased risk for osteoporosis. To help prevent osteoporosis or the bone fractures that can happen because of osteoporosis, the following is recommended:  If you are 19-50 years old, get at least 1,000 mg of calcium and at least 600 mg of vitamin D per day.  If you are older than age 50 but younger than age 70, get at least 1,200 mg of calcium and at least 600 mg of vitamin D per day.  If you are older than age 70, get at least 1,200 mg of calcium and at least 800 mg of vitamin D per day. Smoking and excessive alcohol intake increase the risk of osteoporosis. Eat foods that are rich in calcium and vitamin D, and do weight-bearing exercises several times each week as directed by your health care provider. WHAT SHOULD I KNOW ABOUT HOW MENOPAUSE AFFECTS MY MENTAL HEALTH? Depression may occur at any age, but it is more common as you become older. Common symptoms of depression include:  Low or sad mood.  Changes in sleep patterns.  Changes in appetite or eating patterns.  Feeling an overall lack of motivation or enjoyment of activities that you previously enjoyed.  Frequent crying spells. Talk with your health care provider if you think that you are experiencing depression. WHAT SHOULD I KNOW ABOUT IMMUNIZATIONS? It is important that  you get and maintain your immunizations. These include:  Tetanus, diphtheria, and pertussis (Tdap) booster vaccine.  Influenza every year before the flu season begins.  Pneumonia vaccine.  Shingles vaccine. Your health care provider may also recommend other immunizations.   This information is not intended to replace advice given to you by your health care provider. Make sure you discuss any questions you have with your health care provider.   Document Released: 08/02/2005 Document Revised: 07/01/2014 Document Reviewed: 02/10/2014 Elsevier Interactive Patient Education 2016 [employer]  

## 2015-06-30 LAB — CYTOLOGY - PAP

## 2015-07-01 NOTE — Assessment & Plan Note (Signed)
Annual comprehensive preventive exam was done as well as an evaluation and management of acute and chronic conditions .  During the course of the visit the patient was educated and counseled about appropriate screening and preventive services including :  diabetes screening, lipid analysis with projected  10 year  risk for CAD , nutrition counseling,  colorectal cancer screening, and recommended immunizations.  Printed recommendations for health maintenance screenings was given.  

## 2015-07-01 NOTE — Assessment & Plan Note (Signed)
I have addressed  BMI and recommended a low glycemic index diet utilizing smaller more frequent meals to increase metabolism.  I have also recommended that patient start exercising with a goal of 30 minutes of aerobic exercise a minimum of 5 days per week. Screening for lipid disorders, thyroid and diabetes to be done today.   

## 2015-07-01 NOTE — Assessment & Plan Note (Signed)
Well controlled on current regimen. Renal function stable, no changes today.  Lab Results  Component Value Date   CREATININE 0.65 06/28/2015   Lab Results  Component Value Date   NA 140 06/28/2015   K 4.6 06/28/2015   CL 105 06/28/2015   CO2 28 06/28/2015

## 2015-07-02 ENCOUNTER — Other Ambulatory Visit: Payer: Self-pay | Admitting: Internal Medicine

## 2015-07-02 ENCOUNTER — Encounter: Payer: Self-pay | Admitting: Internal Medicine

## 2015-07-02 DIAGNOSIS — E559 Vitamin D deficiency, unspecified: Secondary | ICD-10-CM

## 2015-07-02 MED ORDER — ERGOCALCIFEROL 1.25 MG (50000 UT) PO CAPS
50000.0000 [IU] | ORAL_CAPSULE | ORAL | Status: DC
Start: 2015-07-02 — End: 2016-01-05

## 2015-07-02 NOTE — Addendum Note (Signed)
Addended by: Crecencio Mc on: 07/02/2015 10:52 AM   Modules accepted: Miquel Dunn

## 2015-07-03 DIAGNOSIS — H25012 Cortical age-related cataract, left eye: Secondary | ICD-10-CM | POA: Diagnosis not present

## 2015-07-03 DIAGNOSIS — H2513 Age-related nuclear cataract, bilateral: Secondary | ICD-10-CM | POA: Diagnosis not present

## 2015-07-07 DIAGNOSIS — D1801 Hemangioma of skin and subcutaneous tissue: Secondary | ICD-10-CM | POA: Diagnosis not present

## 2015-07-07 DIAGNOSIS — Z85820 Personal history of malignant melanoma of skin: Secondary | ICD-10-CM | POA: Diagnosis not present

## 2015-07-07 DIAGNOSIS — L821 Other seborrheic keratosis: Secondary | ICD-10-CM | POA: Diagnosis not present

## 2015-07-07 DIAGNOSIS — L814 Other melanin hyperpigmentation: Secondary | ICD-10-CM | POA: Diagnosis not present

## 2015-07-11 DIAGNOSIS — H25012 Cortical age-related cataract, left eye: Secondary | ICD-10-CM | POA: Diagnosis not present

## 2015-07-11 DIAGNOSIS — H2512 Age-related nuclear cataract, left eye: Secondary | ICD-10-CM | POA: Diagnosis not present

## 2015-07-11 DIAGNOSIS — H25812 Combined forms of age-related cataract, left eye: Secondary | ICD-10-CM | POA: Diagnosis not present

## 2015-08-08 DIAGNOSIS — H25811 Combined forms of age-related cataract, right eye: Secondary | ICD-10-CM | POA: Diagnosis not present

## 2015-08-08 DIAGNOSIS — H2511 Age-related nuclear cataract, right eye: Secondary | ICD-10-CM | POA: Diagnosis not present

## 2016-01-03 ENCOUNTER — Ambulatory Visit (INDEPENDENT_AMBULATORY_CARE_PROVIDER_SITE_OTHER): Payer: 59 | Admitting: Internal Medicine

## 2016-01-03 ENCOUNTER — Encounter: Payer: Self-pay | Admitting: Internal Medicine

## 2016-01-03 VITALS — BP 124/78 | HR 76 | Temp 98.0°F | Resp 12 | Ht 64.0 in | Wt 206.5 lb

## 2016-01-03 DIAGNOSIS — E669 Obesity, unspecified: Secondary | ICD-10-CM

## 2016-01-03 DIAGNOSIS — D250 Submucous leiomyoma of uterus: Secondary | ICD-10-CM | POA: Diagnosis not present

## 2016-01-03 DIAGNOSIS — R140 Abdominal distension (gaseous): Secondary | ICD-10-CM | POA: Diagnosis not present

## 2016-01-03 DIAGNOSIS — R102 Pelvic and perineal pain: Secondary | ICD-10-CM

## 2016-01-03 DIAGNOSIS — E559 Vitamin D deficiency, unspecified: Secondary | ICD-10-CM | POA: Diagnosis not present

## 2016-01-03 DIAGNOSIS — N926 Irregular menstruation, unspecified: Secondary | ICD-10-CM

## 2016-01-03 DIAGNOSIS — I10 Essential (primary) hypertension: Secondary | ICD-10-CM

## 2016-01-03 DIAGNOSIS — N949 Unspecified condition associated with female genital organs and menstrual cycle: Secondary | ICD-10-CM | POA: Diagnosis not present

## 2016-01-03 LAB — COMPREHENSIVE METABOLIC PANEL
ALK PHOS: 84 U/L (ref 39–117)
ALT: 15 U/L (ref 0–35)
AST: 14 U/L (ref 0–37)
Albumin: 4.3 g/dL (ref 3.5–5.2)
BILIRUBIN TOTAL: 0.6 mg/dL (ref 0.2–1.2)
BUN: 13 mg/dL (ref 6–23)
CO2: 29 mEq/L (ref 19–32)
Calcium: 10 mg/dL (ref 8.4–10.5)
Chloride: 105 mEq/L (ref 96–112)
Creatinine, Ser: 0.74 mg/dL (ref 0.40–1.20)
GFR: 87.25 mL/min (ref >60.00)
GLUCOSE: 98 mg/dL (ref 70–99)
Potassium: 4.3 mEq/L (ref 3.5–5.1)
SODIUM: 141 meq/L (ref 135–145)
TOTAL PROTEIN: 7.5 g/dL (ref 6.0–8.3)

## 2016-01-03 LAB — VITAMIN D 25 HYDROXY (VIT D DEFICIENCY, FRACTURES): VITD: 26.5 ng/mL — ABNORMAL LOW (ref 30.00–100.00)

## 2016-01-03 NOTE — Progress Notes (Signed)
Subjective:  Patient ID: Kathryn Torres, female    DOB: 01-13-1961  Age: 55 y.o. MRN: [id number]  CC: The primary encounter diagnosis was Pelvic cramping. Diagnoses of Submucous leiomyoma of uterus, Generalized bloating, Essential hypertension, Vitamin D deficiency, Obesity (BMI 30-39.9), Irregular menses, and Abdominal bloating were also pertinent to this visit.  HPI Kathryn Torres presents for follow up on Hypertension: patient checks blood pressure twice weekly at home.  Readings have been for the most part < 140/80 at rest . Patient is following a reduced salt diet most days and is taking medications as prescribed.  Patient has been taking all medications as directed without experiencing any side effects.  Patient is sleeping well,  Has no specific joint or GI complaints today.   Concerned about 7 lb weight gain and thinks her abdomen is distended.  Not exercising regularly or following a diet intended for weight loss,  But concerned that she has developed larger fibroids which were first seen on ultrasound which was done 18 months ago.    Menses becoming less frequent, occurring every 2 months.  Some cramping even on the months she does not have a period,  Has a 53 yr old dtr who is menstruating      HAD BILATERAL CATARACT SURGERY IN January by L[facility] graham with lens implant .  Back to driving at night  Currently taking no vit d3 took megadose for 3 months for a level of 13.    Outpatient Prescriptions Prior to Visit  Medication Sig Dispense Refill  . losartan (COZAAR) 50 MG tablet TAKE 1 TABLET (50 MG TOTAL) BY MOUTH DAILY. 90 tablet 2  . metoprolol succinate (TOPROL-XL) 25 MG 24 hr tablet TAKE 1 TABLET (25 MG TOTAL) BY MOUTH DAILY. 90 tablet 2  . ergocalciferol (DRISDOL) 50000 units capsule Take 1 capsule (50,000 Units total) by mouth once a week. (Patient not taking: Reported on 01/03/2016) 12 capsule 0   No facility-administered medications prior to visit.    Review of  Systems;  Patient denies headache, fevers, malaise, unintentional weight loss, skin rash, eye pain, sinus congestion and sinus pain, sore throat, dysphagia,  hemoptysis , cough, dyspnea, wheezing, chest pain, palpitations, orthopnea, edema, abdominal pain, nausea, melena, diarrhea, constipation, flank pain, dysuria, hematuria, urinary  Frequency, nocturia, numbness, tingling, seizures,  Focal weakness, Loss of consciousness,  Tremor, insomnia, depression, anxiety, and suicidal ideation.      Objective:  BP 124/78 mmHg  Pulse 76  Temp(Src) 98 F (36.7 C) (Oral)  Resp 12  Ht '5\' 4"'  (1.626 m)  Wt 206 lb 8 oz (93.668 kg)  BMI 35.43 kg/m2  SpO2 98%  LMP 11/05/2015 (Approximate)  BP Readings from Last 3 Encounters:  01/03/16 124/78  06/28/15 138/78  06/22/14 132/72    Wt Readings from Last 3 Encounters:  01/03/16 206 lb 8 oz (93.668 kg)  06/28/15 199 lb 8 oz (90.493 kg)  06/22/14 191 lb 8 oz (86.864 kg)    General appearance: alert, cooperative and appears stated age Ears: normal TM's and external ear canals both ears Throat: lips, mucosa, and tongue normal; teeth and gums normal Neck: no adenopathy, no carotid bruit, supple, symmetrical, trachea midline and thyroid not enlarged, symmetric, no tenderness/mass/nodules Back: symmetric, no curvature. ROM normal. No CVA tenderness. Lungs: clear to auscultation bilaterally Heart: regular rate and rhythm, S1, S2 normal, no murmur, click, rub or gallop Abdomen: soft, non-tender; bowel sounds normal; no masses,  no organomegaly Pulses: 2+ and symmetric Skin: Skin  color, texture, turgor normal. No rashes or lesions Lymph nodes: Cervical, supraclavicular, and axillary nodes normal.  No results found for: HGBA1C  Lab Results  Component Value Date   CREATININE 0.74 01/03/2016   CREATININE 0.65 06/28/2015   CREATININE 0.7 06/22/2014    Lab Results  Component Value Date   WBC 6.8 06/28/2015   HGB 13.0 06/28/2015   HCT 39.5  06/28/2015   PLT 286.0 06/28/2015   GLUCOSE 98 01/03/2016   CHOL 181 06/28/2015   TRIG 75.0 06/28/2015   HDL 57.10 06/28/2015   LDLCALC 109* 06/28/2015   ALT 15 01/03/2016   AST 14 01/03/2016   NA 141 01/03/2016   K 4.3 01/03/2016   CL 105 01/03/2016   CREATININE 0.74 01/03/2016   BUN 13 01/03/2016   CO2 29 01/03/2016   TSH 1.51 06/28/2015    No results found.  Assessment & Plan:   Problem List Items Addressed This Visit    Obesity (BMI 30-39.9) (Chronic)    I have addressed  BMI and recommended a low glycemic index diet utilizing smaller more frequent meals to increase metabolism.  I have also recommended that patient start exercising with a goal of 30 minutes of aerobic exercise a minimum of 5 days per week. Screening for lipid disorders, thyroid and diabetes was done in January   Lab Results  Component Value Date   TSH 1.51 06/28/2015   Lab Results  Component Value Date   CHOL 181 06/28/2015   HDL 57.10 06/28/2015   LDLCALC 109* 06/28/2015   TRIG 75.0 06/28/2015   CHOLHDL 3 06/28/2015           Hypertension    Well controlled on current regimen. , no changes today.      Relevant Orders   Comp Met (CMET) (Completed)   Vitamin D deficiency    Recurrent mild,  Resume 2000 Ius daily of OTC D3       Relevant Orders   VITAMIN D 25 Hydroxy (Vit-D Deficiency, Fractures) (Completed)   Irregular menses    Perimenopause, likely complicated by PCOS .cautioned to continue to use contraception until true menopause has been achieved.       Abdominal bloating    With history of fibroids and perimenopausal status will rule out ovarian mass and increased fibroid ,  Pelvic US and CA 125 ordered .        Other Visit Diagnoses    Pelvic cramping    -  Primary    Relevant Orders    CA 125 (Completed)    Submucous leiomyoma of uterus        Relevant Orders    US Transvaginal Non-OB    Generalized bloating        Relevant Orders    CA 125 (Completed)       I  have discontinued Ms. Beaubien's ergocalciferol. I am also having her maintain her losartan and metoprolol succinate.  No orders of the defined types were placed in this encounter.    Medications Discontinued During This Encounter  Medication Reason  . ergocalciferol (DRISDOL) 50000 units capsule     Follow-up: No Follow-up on file.   Crecencio Mc, MD

## 2016-01-03 NOTE — Progress Notes (Signed)
Pre-visit discussion using our clinic review tool. No additional management support is needed unless otherwise documented below in the visit note.  

## 2016-01-03 NOTE — Patient Instructions (Addendum)
I have ordered a repeat pelvic ultrasound to evaluate your fibroids and ovaries. Perimenopause Perimenopause is the time when your body begins to move into the menopause (no menstrual period for 12 straight months). It is a natural process. Perimenopause can begin 2-8 years before the menopause and usually lasts for 1 year after the menopause. During this time, your ovaries may or may not produce an egg. The ovaries vary in their production of estrogen and progesterone hormones each month. This can cause irregular menstrual periods, difficulty getting pregnant, vaginal bleeding between periods, and uncomfortable symptoms. CAUSES  Irregular production of the ovarian hormones, estrogen and progesterone, and not ovulating every month.  Other causes include:  Tumor of the pituitary gland in the brain.  Medical disease that affects the ovaries.  Radiation treatment.  Chemotherapy.  Unknown causes.  Heavy smoking and excessive alcohol intake can bring on perimenopause sooner. SIGNS AND SYMPTOMS   Hot flashes.  Night sweats.  Irregular menstrual periods.  Decreased sex drive.  Vaginal dryness.  Headaches.  Mood swings.  Depression.  Memory problems.  Irritability.  Tiredness.  Weight gain.  Trouble getting pregnant.  The beginning of losing bone cells (osteoporosis).  The beginning of hardening of the arteries (atherosclerosis). DIAGNOSIS  Your health care provider will make a diagnosis by analyzing your age, menstrual history, and symptoms. He or she will do a physical exam and note any changes in your body, especially your female organs. Female hormone tests may or may not be helpful depending on the amount of female hormones you produce and when you produce them. However, other hormone tests may be helpful to rule out other problems. TREATMENT  In some cases, no treatment is needed. The decision on whether treatment is necessary during the perimenopause should be  made by you and your health care provider based on how the symptoms are affecting you and your lifestyle. Various treatments are available, such as:  Treating individual symptoms with a specific medicine for that symptom.  Herbal medicines that can help specific symptoms.  Counseling.  Group therapy. HOME CARE INSTRUCTIONS   Keep track of your menstrual periods (when they occur, how heavy they are, how long between periods, and how long they last) as well as your symptoms and when they started.  Only take over-the-counter or prescription medicines as directed by your health care provider.  Sleep and rest.  Exercise.  Eat a diet that contains calcium (good for your bones) and soy (acts like the estrogen hormone).  Do not smoke.  Avoid alcoholic beverages.  Take vitamin supplements as recommended by your health care provider. Taking vitamin E may help in certain cases.  Take calcium and vitamin D supplements to help prevent bone loss.  Group therapy is sometimes helpful.  Acupuncture may help in some cases. SEEK MEDICAL CARE IF:   You have questions about any symptoms you are having.  You need a referral to a specialist (gynecologist, psychiatrist, or psychologist). SEEK IMMEDIATE MEDICAL CARE IF:   You have vaginal bleeding.  Your period lasts longer than 8 days.  Your periods are recurring sooner than 21 days.  You have bleeding after intercourse.  You have severe depression.  You have pain when you urinate.  You have severe headaches.  You have vision problems.   This information is not intended to replace advice given to you by your health care provider. Make sure you discuss any questions you have with your health care provider.   Document Released: 07/18/2004  Document Revised: 07/01/2014 Document Reviewed: 01/07/2013 Elsevier Interactive Patient Education [employer].

## 2016-01-04 LAB — CA 125: CA 125: 10 U/mL (ref <35)

## 2016-01-05 ENCOUNTER — Encounter: Payer: Self-pay | Admitting: Internal Medicine

## 2016-01-05 DIAGNOSIS — R140 Abdominal distension (gaseous): Secondary | ICD-10-CM | POA: Insufficient documentation

## 2016-01-05 NOTE — Assessment & Plan Note (Signed)
Recurrent mild,  Resume 2000 Ius daily of OTC D3

## 2016-01-05 NOTE — Assessment & Plan Note (Signed)
I have addressed  BMI and recommended a low glycemic index diet utilizing smaller more frequent meals to increase metabolism.  I have also recommended that patient start exercising with a goal of 30 minutes of aerobic exercise a minimum of 5 days per week. Screening for lipid disorders, thyroid and diabetes was done in January   Lab Results  Component Value Date   TSH 1.51 06/28/2015   Lab Results  Component Value Date   CHOL 181 06/28/2015   HDL 57.10 06/28/2015   LDLCALC 109* 06/28/2015   TRIG 75.0 06/28/2015   CHOLHDL 3 06/28/2015

## 2016-01-05 NOTE — Assessment & Plan Note (Signed)
Well controlled on current regimen, no changes today. 

## 2016-01-05 NOTE — Assessment & Plan Note (Signed)
Perimenopause, likely complicated by PCOS .cautioned to continue to use contraception until true menopause has been achieved.

## 2016-01-05 NOTE — Assessment & Plan Note (Signed)
With history of fibroids and perimenopausal status will rule out ovarian mass and increased fibroid ,  Pelvic US and CA 125 ordered .

## 2016-01-12 ENCOUNTER — Other Ambulatory Visit: Payer: Self-pay | Admitting: Internal Medicine

## 2016-01-12 DIAGNOSIS — R140 Abdominal distension (gaseous): Secondary | ICD-10-CM

## 2016-01-17 DIAGNOSIS — L814 Other melanin hyperpigmentation: Secondary | ICD-10-CM | POA: Diagnosis not present

## 2016-01-17 DIAGNOSIS — L820 Inflamed seborrheic keratosis: Secondary | ICD-10-CM | POA: Diagnosis not present

## 2016-01-17 DIAGNOSIS — Z85820 Personal history of malignant melanoma of skin: Secondary | ICD-10-CM | POA: Diagnosis not present

## 2016-01-18 ENCOUNTER — Ambulatory Visit
Admission: RE | Admit: 2016-01-18 | Discharge: 2016-01-18 | Disposition: A | Discharge status: Home / Self Care | Payer: 59 | Source: Ambulatory Visit | Attending: Internal Medicine | Admitting: Internal Medicine

## 2016-01-18 DIAGNOSIS — R140 Abdominal distension (gaseous): Secondary | ICD-10-CM | POA: Insufficient documentation

## 2016-01-18 DIAGNOSIS — D250 Submucous leiomyoma of uterus: Secondary | ICD-10-CM | POA: Insufficient documentation

## 2016-01-18 DIAGNOSIS — D259 Leiomyoma of uterus, unspecified: Secondary | ICD-10-CM | POA: Diagnosis not present

## 2016-01-21 ENCOUNTER — Encounter: Payer: Self-pay | Admitting: Internal Medicine

## 2016-04-24 ENCOUNTER — Other Ambulatory Visit: Payer: Self-pay | Admitting: Internal Medicine

## 2016-05-22 ENCOUNTER — Other Ambulatory Visit: Payer: Self-pay | Admitting: Internal Medicine

## 2016-05-22 DIAGNOSIS — Z1231 Encounter for screening mammogram for malignant neoplasm of breast: Secondary | ICD-10-CM

## 2016-07-04 ENCOUNTER — Encounter: Payer: Self-pay | Admitting: Internal Medicine

## 2016-07-04 ENCOUNTER — Ambulatory Visit (INDEPENDENT_AMBULATORY_CARE_PROVIDER_SITE_OTHER): Payer: 59 | Admitting: Internal Medicine

## 2016-07-04 VITALS — BP 130/96 | HR 80 | Temp 98.1°F | Resp 16 | Ht 63.25 in | Wt 201.0 lb

## 2016-07-04 DIAGNOSIS — E559 Vitamin D deficiency, unspecified: Secondary | ICD-10-CM

## 2016-07-04 DIAGNOSIS — R5383 Other fatigue: Secondary | ICD-10-CM | POA: Diagnosis not present

## 2016-07-04 DIAGNOSIS — N926 Irregular menstruation, unspecified: Secondary | ICD-10-CM | POA: Diagnosis not present

## 2016-07-04 DIAGNOSIS — Z9889 Other specified postprocedural states: Secondary | ICD-10-CM | POA: Diagnosis not present

## 2016-07-04 DIAGNOSIS — Z85820 Personal history of malignant melanoma of skin: Secondary | ICD-10-CM

## 2016-07-04 DIAGNOSIS — I10 Essential (primary) hypertension: Secondary | ICD-10-CM | POA: Diagnosis not present

## 2016-07-04 DIAGNOSIS — Z0000 Encounter for general adult medical examination without abnormal findings: Secondary | ICD-10-CM

## 2016-07-04 DIAGNOSIS — E669 Obesity, unspecified: Secondary | ICD-10-CM | POA: Diagnosis not present

## 2016-07-04 LAB — CBC WITH DIFFERENTIAL/PLATELET
BASOS PCT: 1.8 % (ref 0.0–3.0)
Basophils Absolute: 0.1 10*3/uL (ref 0.0–0.1)
EOS PCT: 1.2 % (ref 0.0–5.0)
Eosinophils Absolute: 0.1 10*3/uL (ref 0.0–0.7)
HCT: 37.7 % (ref 36.0–46.0)
Hemoglobin: 12.7 g/dL (ref 12.0–15.0)
Lymphocytes Relative: 31.1 % (ref 12.0–46.0)
Lymphs Abs: 2.1 10*3/uL (ref 0.7–4.0)
MCHC: 33.8 g/dL (ref 30.0–36.0)
MCV: 80.7 fl (ref 78.0–100.0)
MONOS PCT: 5.9 % (ref 3.0–12.0)
Monocytes Absolute: 0.4 10*3/uL (ref 0.1–1.0)
NEUTROS ABS: 4 10*3/uL (ref 1.4–7.7)
NEUTROS PCT: 60 % (ref 43.0–77.0)
PLATELETS: 305 10*3/uL (ref 150.0–400.0)
RBC: 4.67 Mil/uL (ref 3.87–5.11)
RDW: 13.1 % (ref 11.5–15.5)
WBC: 6.7 10*3/uL (ref 4.0–10.5)

## 2016-07-04 LAB — MICROALBUMIN / CREATININE URINE RATIO
CREATININE, U: 198.5 mg/dL
MICROALB UR: 0.8 mg/dL (ref 0.0–1.9)
Microalb Creat Ratio: 0.4 mg/g (ref 0.0–30.0)

## 2016-07-04 LAB — LIPID PANEL
Cholesterol: 153 mg/dL (ref 0–200)
HDL: 46 mg/dL (ref >39.00)
LDL Cholesterol: 89 mg/dL (ref 0–99)
NONHDL: 107.45
Total CHOL/HDL Ratio: 3
Triglycerides: 94 mg/dL (ref 0.0–149.0)
VLDL: 18.8 mg/dL (ref 0.0–40.0)

## 2016-07-04 LAB — COMPREHENSIVE METABOLIC PANEL
ALBUMIN: 4.1 g/dL (ref 3.5–5.2)
ALK PHOS: 83 U/L (ref 39–117)
ALT: 21 U/L (ref 0–35)
AST: 15 U/L (ref 0–37)
BUN: 11 mg/dL (ref 6–23)
CALCIUM: 9.7 mg/dL (ref 8.4–10.5)
CO2: 29 mEq/L (ref 19–32)
Chloride: 106 mEq/L (ref 96–112)
Creatinine, Ser: 0.78 mg/dL (ref 0.40–1.20)
GFR: 81.95 mL/min (ref >60.00)
Glucose, Bld: 101 mg/dL — ABNORMAL HIGH (ref 70–99)
Potassium: 4.5 mEq/L (ref 3.5–5.1)
SODIUM: 143 meq/L (ref 135–145)
TOTAL PROTEIN: 6.8 g/dL (ref 6.0–8.3)
Total Bilirubin: 0.5 mg/dL (ref 0.2–1.2)

## 2016-07-04 LAB — VITAMIN D 25 HYDROXY (VIT D DEFICIENCY, FRACTURES): VITD: 14.73 ng/mL — ABNORMAL LOW (ref 30.00–100.00)

## 2016-07-04 LAB — TSH: TSH: 0.89 u[IU]/mL (ref 0.35–4.50)

## 2016-07-04 NOTE — Progress Notes (Signed)
Pre-visit discussion using our clinic review tool. No additional management support is needed unless otherwise documented below in the visit note.  

## 2016-07-04 NOTE — Progress Notes (Signed)
Patient ID: Kathryn Torres, female    DOB: 1960-11-14  Age: 56 y.o. MRN: [id number]  The patient is here for annual  examination and management of other chronic and acute problems.   Mammogram scheduled colonscopy due 2025  PAP smear normal 2017   The risk factors are reflected in the social history.  The roster of all physicians providing medical care to patient - is listed in the Snapshot section of the chart.  Home safety : The patient has smoke detectors in the home. They wear seatbelts.  There are no firearms at home. There is no violence in the home.   There is no risks for hepatitis, STDs or HIV. There is no   history of blood transfusion. They have no travel history to infectious disease endemic areas of the world.  The patient has seen their dentist in the last six month. They have seen their eye doctor in the last year. .  They do not  have excessive sun exposure. Discussed the need for sun protection: hats, long sleeves and use of sunscreen if there is significant sun exposure.   Diet: the importance of a healthy diet is discussed. They do have a healthy diet.  The benefits of regular aerobic exercise were discussed. She walks 4 times per week ,  20 minutes.   Depression screen: there are no signs or vegative symptoms of depression- irritability, change in appetite, anhedonia, sadness/tearfullness.  The following portions of the patient's history were reviewed and updated as appropriate: allergies, current medications, past family history, past medical history,  past surgical history, past social history  and problem list.  Visual acuity was not assessed per patient preference since she has regular follow up with her ophthalmologist. Hearing and body mass index were assessed and reviewed.   During the course of the visit the patient was educated and counseled about appropriate screening and preventive services including : fall prevention , diabetes screening, nutrition  counseling, colorectal cancer screening, and recommended immunizations.    CC: The primary encounter diagnosis was Vitamin D deficiency. Diagnoses of Essential hypertension, Obesity (BMI 30-39.9), Fatigue, unspecified type, Irregular menses, History of melanoma excision, and Visit for preventive health examination were also pertinent to this visit.  Obesity:  Has lost 5 lbs sin e July BMI still 35. Exercising once or twice weekly,  Walking mostly .  Fibroid uterus 7 cm x 5 cm by prior ultrasound   Taking 2000 IUs D3 when she remembers  6 month derm checkup for history of melanoma  measuring BP at [employer] [facility] weekly.  Average has been [id number]    History Kathryn Torres has a past medical history of Hypertension; melanoma (2006); and Previous pregnancy with hemolysis, elevated liver enzymes, and low platelet (HELLP) syndrome, antepartum (2002).   She has a past surgical history that includes Cesarean section (2002) and Breast biopsy (Right, 1998 and 2005).   Her family history includes Breast cancer (age of onset: 53) in her mother; Cancer in her father and mother; Deep vein thrombosis in her maternal grandmother; Hypertension in her mother; Stroke (age of onset: 51) in her paternal grandfather.She reports that she has never smoked. She has never used smokeless tobacco. She reports that she does not drink alcohol or use drugs.  Outpatient Medications Prior to Visit  Medication Sig Dispense Refill  . losartan (COZAAR) 50 MG tablet TAKE 1 TABLET (50 MG TOTAL) BY MOUTH DAILY. 90 tablet 2  . metoprolol succinate (TOPROL-XL) 25 MG 24 hr tablet TAKE  1 TABLET (25 MG TOTAL) BY MOUTH DAILY. 90 tablet 1   No facility-administered medications prior to visit.     Review of Systems   Patient denies headache, fevers, malaise, unintentional weight loss, skin rash, eye pain, sinus congestion and sinus pain, sore throat, dysphagia,  hemoptysis , cough, dyspnea, wheezing, chest pain, palpitations, orthopnea,  edema, abdominal pain, nausea, melena, diarrhea, constipation, flank pain, dysuria, hematuria, urinary  Frequency, nocturia, numbness, tingling, seizures,  Focal weakness, Loss of consciousness,  Tremor, insomnia, depression, anxiety, and suicidal ideation.      Objective:  BP (!) 130/96   Pulse 80   Temp 98.1 F (36.7 C) (Oral)   Resp 16   Ht 5' 3.25" (1.607 m)   Wt 201 lb (91.2 kg)   LMP 06/14/2016 (Within Weeks)   SpO2 97%   BMI 35.32 kg/m   Physical Exam   General appearance: alert, cooperative and appears stated age Head: Normocephalic, without obvious abnormality, atraumatic Eyes: conjunctivae/corneas clear. PERRL, EOM's intact. Fundi benign. Ears: normal TM's and external ear canals both ears Nose: Nares normal. Septum midline. Mucosa normal. No drainage or sinus tenderness. Throat: lips, mucosa, and tongue normal; teeth and gums normal Neck: no adenopathy, no carotid bruit, no JVD, supple, symmetrical, trachea midline and thyroid not enlarged, symmetric, no tenderness/mass/nodules Lungs: clear to auscultation bilaterally Breasts: normal appearance, no masses or tenderness Heart: regular rate and rhythm, S1, S2 normal, no murmur, click, rub or gallop Abdomen: soft, non-tender; bowel sounds normal; no masses,  no organomegaly Extremities: extremities normal, atraumatic, no cyanosis or edema Pulses: 2+ and symmetric Skin: Skin color, texture, turgor normal. No rashes or lesions Neurologic: Alert and oriented X 3, normal strength and tone. Normal symmetric reflexes. Normal coordination and gait.      Assessment & Plan:   Problem List Items Addressed This Visit    History of melanoma excision    From lower back,  Continue 6 month follow up with dermatology       Hypertension    Not Well controlled on current regimen. Continue losartan 50 mg,  Increase toprol to 50 mg daily      Relevant Orders   Comprehensive metabolic panel (Completed)   Lipid panel (Completed)    Microalbumin / creatinine urine ratio (Completed)   Irregular menses    Secondary to perimenopause and having a menstruating daughter in the home.  Thyroid function normal.   Lab Results  Component Value Date   TSH 0.89 07/04/2016         Obesity (BMI 30-39.9) (Chronic)   Visit for preventive health examination    Annual comprehensive preventive exam was done as well as an evaluation and management of chronic conditions .  During the course of the visit the patient was educated and counseled about appropriate screening and preventive services including :  diabetes screening, lipid analysis with projected  10 year  risk for CAD , nutrition counseling, breast, cervical and colorectal cancer screening, and recommended immunizations.  Printed recommendations for health maintenance screenings was given      Vitamin D deficiency - Primary    Recurrent ,  With history of melanoma.  Resume mega dose weekly for 3 months       Relevant Orders   VITAMIN D 25 Hydroxy (Vit-D Deficiency, Fractures) (Completed)    Other Visit Diagnoses    Fatigue, unspecified type       Relevant Orders   TSH (Completed)   CBC with Differential/Platelet (Completed)  I am having Ms. Uppal start on ergocalciferol. I am also having her maintain her losartan and metoprolol succinate.  Meds ordered this encounter  Medications  . ergocalciferol (DRISDOL) 50000 units capsule    Sig: Take 1 capsule (50,000 Units total) by mouth once a week.    Dispense:  4 capsule    Refill:  2    There are no discontinued medications.  Follow-up: No Follow-up on file.   Crecencio Mc, MD

## 2016-07-04 NOTE — Patient Instructions (Signed)
increase toprol to 50 mg at night  Goal BP is 120/70 or less   PAP smear due next in 2020!  Colonoscopy due in  2025  Annual mammograms until you're too old to care!  Health Maintenance, Female Introduction Adopting a healthy lifestyle and getting preventive care can go a long way to promote health and wellness. Talk with your health care provider about what schedule of regular examinations is right for you. This is a good chance for you to check in with your provider about disease prevention and staying healthy. In between checkups, there are plenty of things you can do on your own. Experts have done a lot of research about which lifestyle changes and preventive measures are most likely to keep you healthy. Ask your health care provider for more information. Weight and diet Eat a healthy diet  Be sure to include plenty of vegetables, fruits, low-fat dairy products, and lean protein.  Do not eat a lot of foods high in solid fats, added sugars, or salt.  Get regular exercise. This is one of the most important things you can do for your health.  Most adults should exercise for at least 150 minutes each week. The exercise should increase your heart rate and make you sweat (moderate-intensity exercise).  Most adults should also do strengthening exercises at least twice a week. This is in addition to the moderate-intensity exercise. Maintain a healthy weight  Body mass index (BMI) is a measurement that can be used to identify possible weight problems. It estimates body fat based on height and weight. Your health care provider can help determine your BMI and help you achieve or maintain a healthy weight.  For females 59 years of age and older:  A BMI below 18.5 is considered underweight.  A BMI of 18.5 to 24.9 is normal.  A BMI of 25 to 29.9 is considered overweight.  A BMI of 30 and above is considered obese. Watch levels of cholesterol and blood lipids  You should start having  your blood tested for lipids and cholesterol at 54 years of age, then have this test every 5 years.  You may need to have your cholesterol levels checked more often if:  Your lipid or cholesterol levels are high.  You are older than 54 years of age.  You are at high risk for heart disease. Cancer screening Lung Cancer  Lung cancer screening is recommended for adults 80-55 years old who are at high risk for lung cancer because of a history of smoking.  A yearly low-dose CT scan of the lungs is recommended for people who:  Currently smoke.  Have quit within the past 15 years.  Have at least a 30-pack-year history of smoking. A pack year is smoking an average of one pack of cigarettes a day for 1 year.  Yearly screening should continue until it has been 15 years since you quit.  Yearly screening should stop if you develop a health problem that would prevent you from having lung cancer treatment. Breast Cancer  Practice breast self-awareness. This means understanding how your breasts normally appear and feel.  It also means doing regular breast self-exams. Let your health care provider know about any changes, no matter how small.  If you are in your 20s or 30s, you should have a clinical breast exam (CBE) by a health care provider every 1-3 years as part of a regular health exam.  If you are 84 or older, have a CBE every year.  Also consider having a breast X-ray (mammogram) every year.  If you have a family history of breast cancer, talk to your health care provider about genetic screening.  If you are at high risk for breast cancer, talk to your health care provider about having an MRI and a mammogram every year.  Breast cancer gene (BRCA) assessment is recommended for women who have family members with BRCA-related cancers. BRCA-related cancers include:  Breast.  Ovarian.  Tubal.  Peritoneal cancers.  Results of the assessment will determine the need for genetic  counseling and BRCA1 and BRCA2 testing. Cervical Cancer  Your health care provider may recommend that you be screened regularly for cancer of the pelvic organs (ovaries, uterus, and vagina). This screening involves a pelvic examination, including checking for microscopic changes to the surface of your cervix (Pap test). You may be encouraged to have this screening done every 3 years, beginning at age 1.  For women ages 57-65, health care providers may recommend pelvic exams and Pap testing every 3 years, or they may recommend the Pap and pelvic exam, combined with testing for human papilloma virus (HPV), every 5 years. Some types of HPV increase your risk of cervical cancer. Testing for HPV may also be done on women of any age with unclear Pap test results.  Other health care providers may not recommend any screening for nonpregnant women who are considered low risk for pelvic cancer and who do not have symptoms. Ask your health care provider if a screening pelvic exam is right for you.  If you have had past treatment for cervical cancer or a condition that could lead to cancer, you need Pap tests and screening for cancer for at least 20 years after your treatment. If Pap tests have been discontinued, your risk factors (such as having a new sexual partner) need to be reassessed to determine if screening should resume. Some women have medical problems that increase the chance of getting cervical cancer. In these cases, your health care provider may recommend more frequent screening and Pap tests. Colorectal Cancer  This type of cancer can be detected and often prevented.  Routine colorectal cancer screening usually begins at 54 years of age and continues through 54 years of age.  Your health care provider may recommend screening at an earlier age if you have risk factors for colon cancer.  Your health care provider may also recommend using home test kits to check for hidden blood in the stool.  A  small camera at the end of a tube can be used to examine your colon directly (sigmoidoscopy or colonoscopy). This is done to check for the earliest forms of colorectal cancer.  Routine screening usually begins at age 84.  Direct examination of the colon should be repeated every 5-10 years through 55 years of age. However, you may need to be screened more often if early forms of precancerous polyps or small growths are found. Skin Cancer  Check your skin from head to toe regularly.  Tell your health care provider about any new moles or changes in moles, especially if there is a change in a mole's shape or color.  Also tell your health care provider if you have a mole that is larger than the size of a pencil eraser.  Always use sunscreen. Apply sunscreen liberally and repeatedly throughout the day.  Protect yourself by wearing long sleeves, pants, a wide-brimmed hat, and sunglasses whenever you are outside. Heart disease, diabetes, and high blood pressure  High blood pressure causes heart disease and increases the risk of stroke. High blood pressure is more likely to develop in:  People who have blood pressure in the high end of the normal range (130-139/85-89 mm Hg).  People who are overweight or obese.  People who are African American.  If you are 38-93 years of age, have your blood pressure checked every 3-5 years. If you are 17 years of age or older, have your blood pressure checked every year. You should have your blood pressure measured twice-once when you are at a hospital or clinic, and once when you are not at a hospital or clinic. Record the average of the two measurements. To check your blood pressure when you are not at a hospital or clinic, you can use:  An automated blood pressure machine at a pharmacy.  A home blood pressure monitor.  If you are between 59 years and 36 years old, ask your health care provider if you should take aspirin to prevent strokes.  Have regular  diabetes screenings. This involves taking a blood sample to check your fasting blood sugar level.  If you are at a normal weight and have a low risk for diabetes, have this test once every three years after 54 years of age.  If you are overweight and have a high risk for diabetes, consider being tested at a younger age or more often. Preventing infection Hepatitis B  If you have a higher risk for hepatitis B, you should be screened for this virus. You are considered at high risk for hepatitis B if:  You were born in a country where hepatitis B is common. Ask your health care provider which countries are considered high risk.  Your parents were born in a high-risk country, and you have not been immunized against hepatitis B (hepatitis B vaccine).  You have HIV or AIDS.  You use needles to inject street drugs.  You live with someone who has hepatitis B.  You have had sex with someone who has hepatitis B.  You get hemodialysis treatment.  You take certain medicines for conditions, including cancer, organ transplantation, and autoimmune conditions. Hepatitis C  Blood testing is recommended for:  Everyone born from [birth year] through 1963.  Anyone with known risk factors for hepatitis C. Sexually transmitted infections (STIs)  You should be screened for sexually transmitted infections (STIs) including gonorrhea and chlamydia if:  You are sexually active and are younger than 54 years of age.  You are older than 54 years of age and your health care provider tells you that you are at risk for this type of infection.  Your sexual activity has changed since you were last screened and you are at an increased risk for chlamydia or gonorrhea. Ask your health care provider if you are at risk.  If you do not have HIV, but are at risk, it may be recommended that you take a prescription medicine daily to prevent HIV infection. This is called pre-exposure prophylaxis (PrEP). You are considered at  risk if:  You are sexually active and do not regularly use condoms or know the HIV status of your partner(s).  You take drugs by injection.  You are sexually active with a partner who has HIV. Talk with your health care provider about whether you are at high risk of being infected with HIV. If you choose to begin PrEP, you should first be tested for HIV. You should then be tested every 3 months for as long  as you are taking PrEP. Pregnancy  If you are premenopausal and you may become pregnant, ask your health care provider about preconception counseling.  If you may become pregnant, take 400 to 800 micrograms (mcg) of folic acid every day.  If you want to prevent pregnancy, talk to your health care provider about birth control (contraception). Osteoporosis and menopause  Osteoporosis is a disease in which the bones lose minerals and strength with aging. This can result in serious bone fractures. Your risk for osteoporosis can be identified using a bone density scan.  If you are 48 years of age or older, or if you are at risk for osteoporosis and fractures, ask your health care provider if you should be screened.  Ask your health care provider whether you should take a calcium or vitamin D supplement to lower your risk for osteoporosis.  Menopause may have certain physical symptoms and risks.  Hormone replacement therapy may reduce some of these symptoms and risks. Talk to your health care provider about whether hormone replacement therapy is right for you. Follow these instructions at home:  Schedule regular health, dental, and eye exams.  Stay current with your immunizations.  Do not use any tobacco products including cigarettes, chewing tobacco, or electronic cigarettes.  If you are pregnant, do not drink alcohol.  If you are breastfeeding, limit how much and how often you drink alcohol.  Limit alcohol intake to no more than 1 drink per day for nonpregnant women. One drink  equals 12 ounces of beer, 5 ounces of wine, or 1 ounces of hard liquor.  Do not use street drugs.  Do not share needles.  Ask your health care provider for help if you need support or information about quitting drugs.  Tell your health care provider if you often feel depressed.  Tell your health care provider if you have ever been abused or do not feel safe at home. This information is not intended to replace advice given to you by your health care provider. Make sure you discuss any questions you have with your health care provider. Document Released: 12/24/2010 Document Revised: 11/16/2015 Document Reviewed: 03/14/2015  2017 Elsevier

## 2016-07-05 ENCOUNTER — Encounter: Payer: Self-pay | Admitting: Internal Medicine

## 2016-07-05 MED ORDER — ERGOCALCIFEROL 1.25 MG (50000 UT) PO CAPS: 50000.0000 [IU] | ORAL_CAPSULE | ORAL | 2 refills | Status: DC

## 2016-07-06 NOTE — Assessment & Plan Note (Addendum)
Annual comprehensive preventive exam was done as well as an evaluation and management of chronic conditions .  During the course of the visit the patient was educated and counseled about appropriate screening and preventive services including :  diabetes screening, lipid analysis with projected  10 year  risk for CAD , nutrition counseling, breast, cervical and colorectal cancer screening, and recommended immunizations.  Printed recommendations for health maintenance screenings was given 

## 2016-07-06 NOTE — Assessment & Plan Note (Signed)
Recurrent ,  With history of melanoma.  Resume mega dose weekly for 3 months

## 2016-07-06 NOTE — Assessment & Plan Note (Addendum)
Secondary to perimenopause and having a menstruating daughter in the home.  Thyroid function normal.   Lab Results  Component Value Date   TSH 0.89 07/04/2016

## 2016-07-06 NOTE — Assessment & Plan Note (Signed)
From lower back,  Continue 6 month follow up with dermatology

## 2016-07-06 NOTE — Assessment & Plan Note (Signed)
Not Well controlled on current regimen. Continue losartan 50 mg,  Increase toprol to 50 mg daily

## 2016-07-08 ENCOUNTER — Ambulatory Visit
Admission: RE | Admit: 2016-07-08 | Discharge: 2016-07-08 | Disposition: A | Discharge status: Home / Self Care | Payer: 59 | Source: Ambulatory Visit | Attending: Internal Medicine | Admitting: Internal Medicine

## 2016-07-08 DIAGNOSIS — Z1231 Encounter for screening mammogram for malignant neoplasm of breast: Secondary | ICD-10-CM | POA: Diagnosis not present

## 2016-07-26 ENCOUNTER — Other Ambulatory Visit: Payer: Self-pay | Admitting: Internal Medicine

## 2016-09-09 DIAGNOSIS — H5202 Hypermetropia, left eye: Secondary | ICD-10-CM | POA: Diagnosis not present

## 2016-09-09 DIAGNOSIS — H5211 Myopia, right eye: Secondary | ICD-10-CM | POA: Diagnosis not present

## 2016-09-13 DIAGNOSIS — L821 Other seborrheic keratosis: Secondary | ICD-10-CM | POA: Diagnosis not present

## 2016-09-13 DIAGNOSIS — D1801 Hemangioma of skin and subcutaneous tissue: Secondary | ICD-10-CM | POA: Diagnosis not present

## 2016-09-13 DIAGNOSIS — Z85820 Personal history of malignant melanoma of skin: Secondary | ICD-10-CM | POA: Diagnosis not present

## 2016-12-04 ENCOUNTER — Other Ambulatory Visit: Payer: Self-pay | Admitting: Internal Medicine

## 2016-12-10 ENCOUNTER — Other Ambulatory Visit: Payer: Self-pay | Admitting: Internal Medicine

## 2016-12-11 ENCOUNTER — Telehealth: Payer: Self-pay | Admitting: *Deleted

## 2016-12-11 MED ORDER — ALPRAZOLAM 0.25 MG PO TABS: 0.2500 mg | ORAL_TABLET | Freq: Two times a day (BID) | ORAL | 0 refills | Status: DC | PRN

## 2016-12-11 NOTE — Telephone Encounter (Signed)
rx has been faxed to [facility].

## 2016-12-11 NOTE — Telephone Encounter (Signed)
Alprazolam can be called in to [facility] see chart for dose

## 2016-12-11 NOTE — Telephone Encounter (Signed)
Patient has requested to have one pill to help with her anxiety, pt has to speak at her mother's funeral on 12/12/16.  Pt contact [facility]

## 2016-12-11 NOTE — Telephone Encounter (Signed)
Pt was informed about her Rx. Thank you!

## 2016-12-11 NOTE — Telephone Encounter (Signed)
Please advise 

## 2017-01-01 ENCOUNTER — Ambulatory Visit: Payer: 59 | Admitting: Internal Medicine

## 2017-02-11 ENCOUNTER — Ambulatory Visit: Payer: 59 | Admitting: Internal Medicine

## 2017-03-14 DIAGNOSIS — L814 Other melanin hyperpigmentation: Secondary | ICD-10-CM | POA: Diagnosis not present

## 2017-03-14 DIAGNOSIS — Z85820 Personal history of malignant melanoma of skin: Secondary | ICD-10-CM | POA: Diagnosis not present

## 2017-03-14 DIAGNOSIS — L570 Actinic keratosis: Secondary | ICD-10-CM | POA: Diagnosis not present

## 2017-03-14 DIAGNOSIS — D1800 Hemangioma unspecified site: Secondary | ICD-10-CM | POA: Diagnosis not present

## 2017-03-14 DIAGNOSIS — L821 Other seborrheic keratosis: Secondary | ICD-10-CM | POA: Diagnosis not present

## 2017-03-17 ENCOUNTER — Other Ambulatory Visit: Payer: Self-pay | Admitting: Internal Medicine

## 2017-03-28 ENCOUNTER — Encounter: Payer: Self-pay | Admitting: Internal Medicine

## 2017-03-28 ENCOUNTER — Ambulatory Visit (INDEPENDENT_AMBULATORY_CARE_PROVIDER_SITE_OTHER): Payer: 59 | Admitting: Internal Medicine

## 2017-03-28 VITALS — BP 134/78 | HR 92 | Temp 98.3°F | Resp 16 | Ht 63.25 in | Wt 209.4 lb

## 2017-03-28 DIAGNOSIS — E559 Vitamin D deficiency, unspecified: Secondary | ICD-10-CM | POA: Diagnosis not present

## 2017-03-28 DIAGNOSIS — E669 Obesity, unspecified: Secondary | ICD-10-CM

## 2017-03-28 DIAGNOSIS — I10 Essential (primary) hypertension: Secondary | ICD-10-CM | POA: Diagnosis not present

## 2017-03-28 DIAGNOSIS — J309 Allergic rhinitis, unspecified: Secondary | ICD-10-CM | POA: Diagnosis not present

## 2017-03-28 NOTE — Progress Notes (Signed)
Subjective:  Patient ID: Kathryn Torres, female    DOB: [birth year]  Age: 56 y.o. MRN: [id number]  CC: The primary encounter diagnosis was Essential hypertension. Diagnoses of Vitamin D deficiency, Obesity (BMI 30-39.9), and Allergic rhinitis, unspecified seasonality, unspecified trigger were also pertinent to this visit.  HPI Kathryn Torres presents for follow up on hypertension and GAD.   Hypertension: patient checks blood pressure twice weekly at home.  Readings have been for the most part < 130/80 at rest . Patient is following a reduced salt diet most days and is taking medications as prescribed without  side effects.   She is not exercising regularly due to work schedule and family matters and has gained several pounds,  She denies joint pain daytime somnolence. Diet and activity level discussed  .   Outpatient Medications Prior to Visit  Medication Sig Dispense Refill  . losartan (COZAAR) 50 MG tablet TAKE 1 TABLET (50 MG TOTAL) BY MOUTH DAILY. 90 tablet 1  . metoprolol succinate (TOPROL-XL) 25 MG 24 hr tablet TAKE 1 TABLET (25 MG TOTAL) BY MOUTH DAILY. 90 tablet 1  . ALPRAZolam (XANAX) 0.25 MG tablet Take 1 tablet (0.25 mg total) by mouth 2 (two) times daily as needed for anxiety. (Patient not taking: Reported on 03/28/2017) 20 tablet 0  . ergocalciferol (DRISDOL) 50000 units capsule Take 1 capsule (50,000 Units total) by mouth once a week. (Patient not taking: Reported on 03/28/2017) 4 capsule 2   No facility-administered medications prior to visit.     Review of Systems;  Patient denies headache, fevers, malaise, unintentional weight loss, skin rash, eye pain, sinus congestion and sinus pain, sore throat, dysphagia,  hemoptysis , cough, dyspnea, wheezing, chest pain, palpitations, orthopnea, edema, abdominal pain, nausea, melena, diarrhea, constipation, flank pain, dysuria, hematuria, urinary  Frequency, nocturia, numbness, tingling, seizures,  Focal weakness, Loss of  consciousness,  Tremor, insomnia, depression, anxiety, and suicidal ideation.      Objective:  BP 134/78 (BP Location: Left Arm, Patient Position: Sitting, Cuff Size: Large)   Pulse 92   Temp 98.3 F (36.8 C) (Oral)   Resp 16   Ht 5' 3.25" (1.607 m)   Wt 209 lb 6.4 oz (95 kg)   SpO2 97%   BMI 36.80 kg/m   BP Readings from Last 3 Encounters:  03/28/17 134/78  07/04/16 (!) 130/96  01/03/16 124/78    Wt Readings from Last 3 Encounters:  03/28/17 209 lb 6.4 oz (95 kg)  07/04/16 201 lb (91.2 kg)  01/03/16 206 lb 8 oz (93.7 kg)    General appearance: alert, cooperative and appears stated age Ears: normal TM's and external ear canals both ears Throat: lips, mucosa, and tongue normal; teeth and gums normal Neck: no adenopathy, no carotid bruit, supple, symmetrical, trachea midline and thyroid not enlarged, symmetric, no tenderness/mass/nodules Back: symmetric, no curvature. ROM normal. No CVA tenderness. Lungs: clear to auscultation bilaterally Heart: regular rate and rhythm, S1, S2 normal, no murmur, click, rub or gallop Abdomen: soft, non-tender; bowel sounds normal; no masses,  no organomegaly Pulses: 2+ and symmetric Skin: Skin color, texture, turgor normal. No rashes or lesions Lymph nodes: Cervical, supraclavicular, and axillary nodes normal.  No results found for: HGBA1C  Lab Results  Component Value Date   CREATININE 0.86 03/28/2017   CREATININE 0.78 07/04/2016   CREATININE 0.74 01/03/2016    Lab Results  Component Value Date   WBC 6.7 07/04/2016   HGB 12.7 07/04/2016   HCT 37.7 07/04/2016  PLT 305.0 07/04/2016   GLUCOSE 87 03/28/2017   CHOL 153 07/04/2016   TRIG 94.0 07/04/2016   HDL 46.00 07/04/2016   LDLCALC 89 07/04/2016   ALT 21 07/04/2016   AST 15 07/04/2016   NA 138 03/28/2017   K 4.6 03/28/2017   CL 102 03/28/2017   CREATININE 0.86 03/28/2017   BUN 16 03/28/2017   CO2 23 03/28/2017   TSH 0.89 07/04/2016   MICROALBUR 0.8 07/04/2016    Mm  Screening Breast Tomo Bilateral  Result Date: 07/08/2016 CLINICAL DATA:  Screening. EXAM: 2D DIGITAL SCREENING BILATERAL MAMMOGRAM WITH CAD AND ADJUNCT TOMO COMPARISON:  Previous exam(s). ACR Breast Density Category c: The breast tissue is heterogeneously dense, which may obscure small masses. FINDINGS: There are no findings suspicious for malignancy. Images were processed with CAD. IMPRESSION: No mammographic evidence of malignancy. A result letter of this screening mammogram will be mailed directly to the patient. RECOMMENDATION: Screening mammogram in one year. (Code:SM-B-01Y) BI-RADS CATEGORY  1: Negative. Electronically Signed   By: Pamelia Hoit M.D.   On: 07/08/2016 09:14    Assessment & Plan:   Problem List Items Addressed This Visit    Allergic rhinitis    Recommended use of 2nd generation antihistamines      Hypertension - Primary    Well controlled on current regimen. Renal function stable, no changes today.  Lab Results  Component Value Date   CREATININE 0.86 03/28/2017   Lab Results  Component Value Date   NA 138 03/28/2017   K 4.6 03/28/2017   CL 102 03/28/2017   CO2 23 03/28/2017         Relevant Orders   Basic metabolic panel (Completed)   Obesity (BMI 30-39.9) (Chronic)    I have addressed  BMI and recommended wt loss of 10% of body weigh over the next 6 months using a low glycemic index diet and regular exercise a minimum of 5 days per week.        Vitamin D deficiency   Relevant Orders   VITAMIN D 25 Hydroxy (Vit-D Deficiency, Fractures) (Completed)     A total of 25 minutes of face to face time was spent with patient more than half of which was spent in counselling about the above mentioned conditions  and coordination of care  I have discontinued Ms. Kading's ergocalciferol and ALPRAZolam. I am also having her maintain her metoprolol succinate and losartan.  No orders of the defined types were placed in this encounter.   Medications Discontinued  During This Encounter  Medication Reason  . ALPRAZolam (XANAX) 0.25 MG tablet Patient has not taken in last 30 days  . ergocalciferol (DRISDOL) 50000 units capsule Completed Course    Follow-up: No Follow-up on file.   Crecencio Mc, MD

## 2017-03-28 NOTE — Patient Instructions (Signed)
For your allergies ,  You can use one of these newer second generation antihistamines that are longer acting, non sedating and  available OTC:  Generic  Zyrtec, which is cetirizine.    generic Allegra , available generically as fexofenadine ; comes in 60 mg and 180 mg once daily strengths.    Generic Claritin :  also available as loratidine .    As long as your blood pressure remains < 130/80,  No changes are needed   See you in January

## 2017-03-29 LAB — BASIC METABOLIC PANEL
BUN: 16 mg/dL (ref 7–25)
CO2: 23 mmol/L (ref 20–32)
CREATININE: 0.86 mg/dL (ref 0.50–1.05)
Calcium: 9.7 mg/dL (ref 8.6–10.4)
Chloride: 102 mmol/L (ref 98–110)
Glucose, Bld: 87 mg/dL (ref 65–99)
POTASSIUM: 4.6 mmol/L (ref 3.5–5.3)
Sodium: 138 mmol/L (ref 135–146)

## 2017-03-29 LAB — VITAMIN D 25 HYDROXY (VIT D DEFICIENCY, FRACTURES): VIT D 25 HYDROXY: 17 ng/mL — AB (ref 30–100)

## 2017-03-30 ENCOUNTER — Other Ambulatory Visit: Payer: Self-pay | Admitting: Internal Medicine

## 2017-03-30 ENCOUNTER — Encounter: Payer: Self-pay | Admitting: Internal Medicine

## 2017-03-30 DIAGNOSIS — J309 Allergic rhinitis, unspecified: Secondary | ICD-10-CM | POA: Insufficient documentation

## 2017-03-30 DIAGNOSIS — E559 Vitamin D deficiency, unspecified: Secondary | ICD-10-CM

## 2017-03-30 MED ORDER — ERGOCALCIFEROL 1.25 MG (50000 UT) PO CAPS: 50000.0000 [IU] | ORAL_CAPSULE | ORAL | 3 refills | Status: DC

## 2017-03-30 NOTE — Assessment & Plan Note (Signed)
Recommended use of 2nd generation antihistamines

## 2017-03-30 NOTE — Assessment & Plan Note (Signed)
I have addressed  BMI and recommended wt loss of 10% of body weigh over the next 6 months using a low glycemic index diet and regular exercise a minimum of 5 days per week.   

## 2017-03-30 NOTE — Assessment & Plan Note (Signed)
Low again.  Drisol rx sent to [facility].

## 2017-03-30 NOTE — Assessment & Plan Note (Signed)
Well controlled on current regimen. Renal function stable, no changes today.  Lab Results  Component Value Date   CREATININE 0.86 03/28/2017   Lab Results  Component Value Date   NA 138 03/28/2017   K 4.6 03/28/2017   CL 102 03/28/2017   CO2 23 03/28/2017

## 2017-03-31 MED ORDER — ERGOCALCIFEROL 1.25 MG (50000 UT) PO CAPS: 50000.0000 [IU] | ORAL_CAPSULE | ORAL | 3 refills | Status: DC

## 2017-05-27 ENCOUNTER — Other Ambulatory Visit: Payer: Self-pay | Admitting: Internal Medicine

## 2017-05-27 DIAGNOSIS — Z1231 Encounter for screening mammogram for malignant neoplasm of breast: Secondary | ICD-10-CM

## 2017-06-26 ENCOUNTER — Other Ambulatory Visit: Payer: Self-pay | Admitting: Internal Medicine

## 2017-07-14 ENCOUNTER — Encounter: Payer: Self-pay | Admitting: Internal Medicine

## 2017-07-14 ENCOUNTER — Ambulatory Visit (INDEPENDENT_AMBULATORY_CARE_PROVIDER_SITE_OTHER): Payer: 59 | Admitting: Internal Medicine

## 2017-07-14 DIAGNOSIS — Z0000 Encounter for general adult medical examination without abnormal findings: Secondary | ICD-10-CM | POA: Diagnosis not present

## 2017-07-14 DIAGNOSIS — Z85820 Personal history of malignant melanoma of skin: Secondary | ICD-10-CM | POA: Diagnosis not present

## 2017-07-14 DIAGNOSIS — Z9889 Other specified postprocedural states: Secondary | ICD-10-CM

## 2017-07-14 DIAGNOSIS — E669 Obesity, unspecified: Secondary | ICD-10-CM | POA: Diagnosis not present

## 2017-07-14 DIAGNOSIS — I10 Essential (primary) hypertension: Secondary | ICD-10-CM

## 2017-07-14 NOTE — Patient Instructions (Addendum)
To help you sleep and relax,  Consider using the "headspace."  App on your On I phone \  You can use up to 5000 Ius of D3 daily during the winter months,  But if you do,  Let's recheck D in April  Health Maintenance, Female Adopting a healthy lifestyle and getting preventive care can go a long way to promote health and wellness. Talk with your health care provider about what schedule of regular examinations is right for you. This is a good chance for you to check in with your provider about disease prevention and staying healthy. In between checkups, there are plenty of things you can do on your own. Experts have done a lot of research about which lifestyle changes and preventive measures are most likely to keep you healthy. Ask your health care provider for more information. Weight and diet Eat a healthy diet  Be sure to include plenty of vegetables, fruits, low-fat dairy products, and lean protein.  Do not eat a lot of foods high in solid fats, added sugars, or salt.  Get regular exercise. This is one of the most important things you can do for your health. ? Most adults should exercise for at least 150 minutes each week. The exercise should increase your heart rate and make you sweat (moderate-intensity exercise). ? Most adults should also do strengthening exercises at least twice a week. This is in addition to the moderate-intensity exercise.  Maintain a healthy weight  Body mass index (BMI) is a measurement that can be used to identify possible weight problems. It estimates body fat based on height and weight. Your health care provider can help determine your BMI and help you achieve or maintain a healthy weight.  For females 56 years of age and older: ? A BMI below 18.5 is considered underweight. ? A BMI of 18.5 to 24.9 is normal. ? A BMI of 25 to 29.9 is considered overweight. ? A BMI of 30 and above is considered obese.  Watch levels of cholesterol and blood lipids  You should  start having your blood tested for lipids and cholesterol at 55 years of age, then have this test every 5 years.  You may need to have your cholesterol levels checked more often if: ? Your lipid or cholesterol levels are high. ? You are older than 55 years of age. ? You are at high risk for heart disease.  Cancer screening Lung Cancer  Lung cancer screening is recommended for adults 55-74 years old who are at high risk for lung cancer because of a history of smoking.  A yearly low-dose CT scan of the lungs is recommended for people who: ? Currently smoke. ? Have quit within the past 15 years. ? Have at least a 30-pack-year history of smoking. A pack year is smoking an average of one pack of cigarettes a day for 1 year.  Yearly screening should continue until it has been 15 years since you quit.  Yearly screening should stop if you develop a health problem that would prevent you from having lung cancer treatment.  Breast Cancer  Practice breast self-awareness. This means understanding how your breasts normally appear and feel.  It also means doing regular breast self-exams. Let your health care provider know about any changes, no matter how small.  If you are in your 20s or 30s, you should have a clinical breast exam (CBE) by a health care provider every 1-3 years as part of a regular health exam.  If you are 40 or older, have a CBE every year. Also consider having a breast X-ray (mammogram) every year.  If you have a family history of breast cancer, talk to your health care provider about genetic screening.  If you are at high risk for breast cancer, talk to your health care provider about having an MRI and a mammogram every year.  Breast cancer gene (BRCA) assessment is recommended for women who have family members with BRCA-related cancers. BRCA-related cancers include: ? Breast. ? Ovarian. ? Tubal. ? Peritoneal cancers.  Results of the assessment will determine the need  for genetic counseling and BRCA1 and BRCA2 testing.  Cervical Cancer Your health care provider may recommend that you be screened regularly for cancer of the pelvic organs (ovaries, uterus, and vagina). This screening involves a pelvic examination, including checking for microscopic changes to the surface of your cervix (Pap test). You may be encouraged to have this screening done every 3 years, beginning at age 53.  For women ages 32-65, health care providers may recommend pelvic exams and Pap testing every 3 years, or they may recommend the Pap and pelvic exam, combined with testing for human papilloma virus (HPV), every 5 years. Some types of HPV increase your risk of cervical cancer. Testing for HPV may also be done on women of any age with unclear Pap test results.  Other health care providers may not recommend any screening for nonpregnant women who are considered low risk for pelvic cancer and who do not have symptoms. Ask your health care provider if a screening pelvic exam is right for you.  If you have had past treatment for cervical cancer or a condition that could lead to cancer, you need Pap tests and screening for cancer for at least 20 years after your treatment. If Pap tests have been discontinued, your risk factors (such as having a new sexual partner) need to be reassessed to determine if screening should resume. Some women have medical problems that increase the chance of getting cervical cancer. In these cases, your health care provider may recommend more frequent screening and Pap tests.  Colorectal Cancer  This type of cancer can be detected and often prevented.  Routine colorectal cancer screening usually begins at 55 years of age and continues through 55 years of age.  Your health care provider may recommend screening at an earlier age if you have risk factors for colon cancer.  Your health care provider may also recommend using home test kits to check for hidden blood in  the stool.  A small camera at the end of a tube can be used to examine your colon directly (sigmoidoscopy or colonoscopy). This is done to check for the earliest forms of colorectal cancer.  Routine screening usually begins at age 43.  Direct examination of the colon should be repeated every 5-10 years through 55 years of age. However, you may need to be screened more often if early forms of precancerous polyps or small growths are found.  Skin Cancer  Check your skin from head to toe regularly.  Tell your health care provider about any new moles or changes in moles, especially if there is a change in a mole's shape or color.  Also tell your health care provider if you have a mole that is larger than the size of a pencil eraser.  Always use sunscreen. Apply sunscreen liberally and repeatedly throughout the day.  Protect yourself by wearing long sleeves, pants, a wide-brimmed hat, and  sunglasses whenever you are outside.  Heart disease, diabetes, and high blood pressure  High blood pressure causes heart disease and increases the risk of stroke. High blood pressure is more likely to develop in: ? People who have blood pressure in the high end of the normal range (130-139/85-89 mm Hg). ? People who are overweight or obese. ? People who are African American.  If you are 23-81 years of age, have your blood pressure checked every 3-5 years. If you are 36 years of age or older, have your blood pressure checked every year. You should have your blood pressure measured twice-once when you are at a hospital or clinic, and once when you are not at a hospital or clinic. Record the average of the two measurements. To check your blood pressure when you are not at a hospital or clinic, you can use: ? An automated blood pressure machine at a pharmacy. ? A home blood pressure monitor.  If you are between 20 years and 24 years old, ask your health care provider if you should take aspirin to prevent  strokes.  Have regular diabetes screenings. This involves taking a blood sample to check your fasting blood sugar level. ? If you are at a normal weight and have a low risk for diabetes, have this test once every three years after 55 years of age. ? If you are overweight and have a high risk for diabetes, consider being tested at a younger age or more often. Preventing infection Hepatitis B  If you have a higher risk for hepatitis B, you should be screened for this virus. You are considered at high risk for hepatitis B if: ? You were born in a country where hepatitis B is common. Ask your health care provider which countries are considered high risk. ? Your parents were born in a high-risk country, and you have not been immunized against hepatitis B (hepatitis B vaccine). ? You have HIV or AIDS. ? You use needles to inject street drugs. ? You live with someone who has hepatitis B. ? You have had sex with someone who has hepatitis B. ? You get hemodialysis treatment. ? You take certain medicines for conditions, including cancer, organ transplantation, and autoimmune conditions.  Hepatitis C  Blood testing is recommended for: ? Everyone born from [birth year] through 1963. ? Anyone with known risk factors for hepatitis C.  Sexually transmitted infections (STIs)  You should be screened for sexually transmitted infections (STIs) including gonorrhea and chlamydia if: ? You are sexually active and are younger than 55 years of age. ? You are older than 55 years of age and your health care provider tells you that you are at risk for this type of infection. ? Your sexual activity has changed since you were last screened and you are at an increased risk for chlamydia or gonorrhea. Ask your health care provider if you are at risk.  If you do not have HIV, but are at risk, it may be recommended that you take a prescription medicine daily to prevent HIV infection. This is called pre-exposure prophylaxis  (PrEP). You are considered at risk if: ? You are sexually active and do not regularly use condoms or know the HIV status of your partner(s). ? You take drugs by injection. ? You are sexually active with a partner who has HIV.  Talk with your health care provider about whether you are at high risk of being infected with HIV. If you choose to begin PrEP, you  should first be tested for HIV. You should then be tested every 3 months for as long as you are taking PrEP. Pregnancy  If you are premenopausal and you may become pregnant, ask your health care provider about preconception counseling.  If you may become pregnant, take 400 to 800 micrograms (mcg) of folic acid every day.  If you want to prevent pregnancy, talk to your health care provider about birth control (contraception). Osteoporosis and menopause  Osteoporosis is a disease in which the bones lose minerals and strength with aging. This can result in serious bone fractures. Your risk for osteoporosis can be identified using a bone density scan.  If you are 36 years of age or older, or if you are at risk for osteoporosis and fractures, ask your health care provider if you should be screened.  Ask your health care provider whether you should take a calcium or vitamin D supplement to lower your risk for osteoporosis.  Menopause may have certain physical symptoms and risks.  Hormone replacement therapy may reduce some of these symptoms and risks. Talk to your health care provider about whether hormone replacement therapy is right for you. Follow these instructions at home:  Schedule regular health, dental, and eye exams.  Stay current with your immunizations.  Do not use any tobacco products including cigarettes, chewing tobacco, or electronic cigarettes.  If you are pregnant, do not drink alcohol.  If you are breastfeeding, limit how much and how often you drink alcohol.  Limit alcohol intake to no more than 1 drink per day for  nonpregnant women. One drink equals 12 ounces of beer, 5 ounces of wine, or 1 ounces of hard liquor.  Do not use street drugs.  Do not share needles.  Ask your health care provider for help if you need support or information about quitting drugs.  Tell your health care provider if you often feel depressed.  Tell your health care provider if you have ever been abused or do not feel safe at home. This information is not intended to replace advice given to you by your health care provider. Make sure you discuss any questions you have with your health care provider. Document Released: 12/24/2010 Document Revised: 11/16/2015 Document Reviewed: 03/14/2015 Elsevier Interactive Patient Education  [employer].

## 2017-07-14 NOTE — Progress Notes (Signed)
Patient ID: Kathryn Torres, female    DOB: Feb 22, 1961  Age: 57 y.o. MRN: [id number]  The patient is here for her annual preventivie  examination and management of other chronic and acute problems.    Mammogram this Wednesday  PAP normal 2017 Colon 2015 y Dr. Candace Cruise  Normal 10 yrs  The risk factors are reflected in the social history.  The roster of all physicians providing medical care to patient - is listed in the Snapshot section of the chart.  Activities of daily living:  The patient is 100% independent in all ADLs: dressing, toileting, feeding as well as independent mobility  Home safety : The patient has smoke detectors in the home. They wear seatbelts.  There are no firearms at home. There is no violence in the home.   There is no risks for hepatitis, STDs or HIV. There is no   history of blood transfusion. They have no travel history to infectious disease endemic areas of the world.  The patient has seen their dentist in the last six month.   They have deferred audiologic testing in the last year.  They do not  have excessive sun exposure. Discussed the need for sun protection: hats, long sleeves and use of sunscreen if there is significant sun exposure.   Diet: the importance of a healthy diet is discussed. They do have a healthy diet.  The benefits of regular aerobic exercise were discussed. She walks 4 times per week ,  20 minutes.   Depression screen: there are no signs or vegative symptoms of depression- irritability, change in appetite, anhedonia, sadness/tearfullness.  The following portions of the patient's history were reviewed and updated as appropriate: allergies, current medications, past family history, past medical history,  past surgical history, past social history  and problem list.  Visual acuity was not assessed per patient preference since she has regular follow up with her ophthalmologist. Hearing and body mass index were assessed and reviewed.   During the course  of the visit the patient was educated and counseled about appropriate screening and preventive services including : fall prevention , diabetes screening, nutrition counseling, colorectal cancer screening, and recommended immunizations.    CC: Diagnoses of Visit for preventive health examination, Obesity (BMI 30-39.9), Essential hypertension, and History of melanoma excision were pertinent to this visit.  History Kathryn Torres has a past medical history of Hypertension, melanoma (2006), and Previous pregnancy with hemolysis, elevated liver enzymes, and low platelet (HELLP) syndrome, antepartum (2002).   She has a past surgical history that includes Cesarean section (2002) and Breast biopsy (Right, 1998 and 2005).   Her family history includes Breast cancer (age of onset: 48) in her mother; Cancer in her father and mother; Deep vein thrombosis in her maternal grandmother; Hypertension in her mother; Stroke (age of onset: 63) in her paternal grandfather.She reports that  has never smoked. she has never used smokeless tobacco. She reports that she does not drink alcohol or use drugs.  Outpatient Medications Prior to Visit  Medication Sig Dispense Refill  . ergocalciferol (DRISDOL) 50000 units capsule Take 1 capsule (50,000 Units total) by mouth once a week. 4 capsule 3  . losartan (COZAAR) 50 MG tablet TAKE 1 TABLET (50 MG TOTAL) BY MOUTH DAILY. 90 tablet 1  . metoprolol succinate (TOPROL-XL) 25 MG 24 hr tablet TAKE 1 TABLET (25 MG TOTAL) BY MOUTH DAILY. 90 tablet 1   No facility-administered medications prior to visit.     Review of Systems  Patient denies headache, fevers, malaise, unintentional weight loss, skin rash, eye pain, sinus congestion and sinus pain, sore throat, dysphagia,  hemoptysis , cough, dyspnea, wheezing, chest pain, palpitations, orthopnea, edema, abdominal pain, nausea, melena, diarrhea, constipation, flank pain, dysuria, hematuria, urinary  Frequency, nocturia, numbness, tingling,  seizures,  Focal weakness, Loss of consciousness,  Tremor, insomnia, depression, anxiety, and suicidal ideation.      Objective:  BP 118/76 (BP Location: Left Arm, Patient Position: Sitting, Cuff Size: Large)   Pulse 72   Temp 98.3 F (36.8 C) (Oral)   Resp 15   Ht 5' 3.25" (1.607 m)   Wt 213 lb (96.6 kg)   SpO2 99%   BMI 37.43 kg/m   Physical Exam  General appearance: alert, cooperative and appears stated age Ears: normal TM's and external ear canals both ears Throat: lips, mucosa, and tongue normal; teeth and gums normal Neck: no adenopathy, no carotid bruit, supple, symmetrical, trachea midline and thyroid not enlarged, symmetric, no tenderness/mass/nodules Back: symmetric, no curvature. ROM normal. No CVA tenderness. Lungs: clear to auscultation bilaterally Heart: regular rate and rhythm, S1, S2 normal, no murmur, click, rub or gallop Abdomen: soft, non-tender; bowel sounds normal; no masses,  no organomegaly Pulses: 2+ and symmetric Skin: Skin color, texture, turgor normal. No rashes or lesions Lymph nodes: Cervical, supraclavicular, and axillary nodes normal.   Assessment & Plan:   Problem List Items Addressed This Visit    History of melanoma excision    From lower back,  With no recurrence.  She continues to have  6 month follow up with dermatology       Hypertension    Well controlled on current regimen. Renal function stable, no changes today.  Lab Results  Component Value Date   CREATININE 0.86 03/28/2017   Lab Results  Component Value Date   NA 138 03/28/2017   K 4.6 03/28/2017   CL 102 03/28/2017   CO2 23 03/28/2017         Obesity (BMI 30-39.9) (Chronic)    I have addressed  BMI and recommended wt loss of 10% of body weigh over the next 6 months using a low glycemic index diet and regular exercise a minimum of 5 days per week.        Visit for preventive health examination    Annual comprehensive preventive exam was done as well as an  evaluation and management of chronic conditions .  During the course of the visit the patient was educated and counseled about appropriate screening and preventive services including :  diabetes screening, lipid analysis with projected  10 year  risk for CAD , nutrition counseling, breast, cervical and colorectal cancer screening, and recommended immunizations.  Printed recommendations for health maintenance screenings was given         I am having Kathryn Torres maintain her losartan, ergocalciferol, and metoprolol succinate.  No orders of the defined types were placed in this encounter.   There are no discontinued medications.  Follow-up: No Follow-up on file.   Crecencio Mc, MD

## 2017-07-15 ENCOUNTER — Encounter: Payer: Self-pay | Admitting: Internal Medicine

## 2017-07-15 NOTE — Assessment & Plan Note (Signed)
Annual comprehensive preventive exam was done as well as an evaluation and management of chronic conditions .  During the course of the visit the patient was educated and counseled about appropriate screening and preventive services including :  diabetes screening, lipid analysis with projected  10 year  risk for CAD , nutrition counseling, breast, cervical and colorectal cancer screening, and recommended immunizations.  Printed recommendations for health maintenance screenings was given 

## 2017-07-15 NOTE — Assessment & Plan Note (Signed)
From lower back,  With no recurrence.  She continues to have  6 month follow up with dermatology

## 2017-07-15 NOTE — Assessment & Plan Note (Signed)
Well controlled on current regimen. Renal function stable, no changes today.  Lab Results  Component Value Date   CREATININE 0.86 03/28/2017   Lab Results  Component Value Date   NA 138 03/28/2017   K 4.6 03/28/2017   CL 102 03/28/2017   CO2 23 03/28/2017

## 2017-07-15 NOTE — Assessment & Plan Note (Signed)
I have addressed  BMI and recommended wt loss of 10% of body weigh over the next 6 months using a low glycemic index diet and regular exercise a minimum of 5 days per week.   

## 2017-07-16 ENCOUNTER — Encounter: Payer: Self-pay | Admitting: Internal Medicine

## 2017-07-16 ENCOUNTER — Ambulatory Visit
Admission: RE | Admit: 2017-07-16 | Discharge: 2017-07-16 | Disposition: A | Discharge status: Home / Self Care | Payer: 59 | Source: Ambulatory Visit | Attending: Internal Medicine | Admitting: Internal Medicine

## 2017-07-16 ENCOUNTER — Encounter: Payer: Self-pay | Admitting: Radiology

## 2017-07-16 DIAGNOSIS — Z1231 Encounter for screening mammogram for malignant neoplasm of breast: Secondary | ICD-10-CM | POA: Diagnosis not present

## 2017-09-10 DIAGNOSIS — H5203 Hypermetropia, bilateral: Secondary | ICD-10-CM | POA: Diagnosis not present

## 2017-09-17 DIAGNOSIS — D1801 Hemangioma of skin and subcutaneous tissue: Secondary | ICD-10-CM | POA: Diagnosis not present

## 2017-09-17 DIAGNOSIS — L718 Other rosacea: Secondary | ICD-10-CM | POA: Diagnosis not present

## 2017-09-17 DIAGNOSIS — Z85820 Personal history of malignant melanoma of skin: Secondary | ICD-10-CM | POA: Diagnosis not present

## 2017-09-17 DIAGNOSIS — L308 Other specified dermatitis: Secondary | ICD-10-CM | POA: Diagnosis not present

## 2017-09-17 DIAGNOSIS — L853 Xerosis cutis: Secondary | ICD-10-CM | POA: Diagnosis not present

## 2017-09-17 DIAGNOSIS — L821 Other seborrheic keratosis: Secondary | ICD-10-CM | POA: Diagnosis not present

## 2017-09-19 ENCOUNTER — Other Ambulatory Visit: Payer: Self-pay | Admitting: Internal Medicine

## 2017-12-23 ENCOUNTER — Other Ambulatory Visit: Payer: Self-pay | Admitting: Internal Medicine

## 2018-01-06 ENCOUNTER — Other Ambulatory Visit: Payer: Self-pay | Admitting: Internal Medicine

## 2018-01-12 ENCOUNTER — Ambulatory Visit: Payer: 59 | Admitting: Internal Medicine

## 2018-01-19 ENCOUNTER — Ambulatory Visit: Payer: 59 | Admitting: Internal Medicine

## 2018-02-25 ENCOUNTER — Ambulatory Visit: Payer: 59 | Admitting: Internal Medicine

## 2018-02-25 ENCOUNTER — Encounter: Payer: Self-pay | Admitting: Internal Medicine

## 2018-02-25 VITALS — BP 122/72 | HR 79 | Temp 97.8°F | Resp 14 | Ht 63.25 in | Wt 218.6 lb

## 2018-02-25 DIAGNOSIS — E669 Obesity, unspecified: Secondary | ICD-10-CM

## 2018-02-25 DIAGNOSIS — I10 Essential (primary) hypertension: Secondary | ICD-10-CM | POA: Diagnosis not present

## 2018-02-25 DIAGNOSIS — E559 Vitamin D deficiency, unspecified: Secondary | ICD-10-CM | POA: Diagnosis not present

## 2018-02-25 LAB — COMPREHENSIVE METABOLIC PANEL
ALBUMIN: 4.2 g/dL (ref 3.5–5.2)
ALT: 18 U/L (ref 0–35)
AST: 16 U/L (ref 0–37)
Alkaline Phosphatase: 80 U/L (ref 39–117)
BUN: 13 mg/dL (ref 6–23)
CALCIUM: 9.8 mg/dL (ref 8.4–10.5)
CO2: 26 meq/L (ref 19–32)
CREATININE: 0.86 mg/dL (ref 0.40–1.20)
Chloride: 104 mEq/L (ref 96–112)
GFR: 72.77 mL/min (ref >60.00)
Glucose, Bld: 98 mg/dL (ref 70–99)
Potassium: 4.3 mEq/L (ref 3.5–5.1)
Sodium: 139 mEq/L (ref 135–145)
Total Bilirubin: 0.5 mg/dL (ref 0.2–1.2)
Total Protein: 7.3 g/dL (ref 6.0–8.3)

## 2018-02-25 LAB — VITAMIN D 25 HYDROXY (VIT D DEFICIENCY, FRACTURES): VITD: 18.15 ng/mL — AB (ref 30.00–100.00)

## 2018-02-25 NOTE — Progress Notes (Signed)
Subjective:  Patient ID: Kathryn Torres, female    DOB: [birth year]  Age: 57 y.o. MRN: [id number]  CC: The primary encounter diagnosis was Essential hypertension. Diagnoses of Vitamin D deficiency and Obesity (BMI 30-39.9) were also pertinent to this visit.  HPI [name] presents for 6 MONTH FOLLOW UP ON Hypertension: patient checks blood pressure twice weekly at home.  Readings have been < 140/80 at rest . Patient is following a reduced salt diet most days and is taking medications as prescribed  She is not exercising regularly due to work schedule.  Has gained 5 lbs since last visit in January    Outpatient Medications Prior to Visit  Medication Sig Dispense Refill  . cholecalciferol (VITAMIN D) 1000 units tablet Take 1,000 Units by mouth daily.    Marland Kitchen losartan (COZAAR) 50 MG tablet TAKE 1 TABLET BY MOUTH DAILY. 90 tablet 1  . metoprolol succinate (TOPROL-XL) 25 MG 24 hr tablet TAKE 1 TABLET (25 MG TOTAL) BY MOUTH DAILY. 90 tablet 1  . ergocalciferol (DRISDOL) 50000 units capsule Take 1 capsule (50,000 Units total) by mouth once a week. (Patient not taking: Reported on 02/25/2018) 4 capsule 3   No facility-administered medications prior to visit.     Review of Systems;  Patient denies headache, fevers, malaise, unintentional weight loss, skin rash, eye pain, sinus congestion and sinus pain, sore throat, dysphagia,  hemoptysis , cough, dyspnea, wheezing, chest pain, palpitations, orthopnea, edema, abdominal pain, nausea, melena, diarrhea, constipation, flank pain, dysuria, hematuria, urinary  Frequency, nocturia, numbness, tingling, seizures,  Focal weakness, Loss of consciousness,  Tremor, insomnia, depression, anxiety, and suicidal ideation.      Objective:  BP 122/72 (BP Location: Left Arm, Patient Position: Sitting, Cuff Size: Large)   Pulse 79   Temp 97.8 F (36.6 C) (Oral)   Resp 14   Ht 5' 3.25" (1.607 m)   Wt 218 lb 9.6 oz (99.2 kg)   SpO2 97%   BMI 38.42 kg/m   BP  Readings from Last 3 Encounters:  02/25/18 122/72  07/14/17 118/76  03/28/17 134/78    Wt Readings from Last 3 Encounters:  02/25/18 218 lb 9.6 oz (99.2 kg)  07/14/17 213 lb (96.6 kg)  03/28/17 209 lb 6.4 oz (95 kg)    General appearance: alert, cooperative and appears stated age Ears: normal TM's and external ear canals both ears Throat: lips, mucosa, and tongue normal; teeth and gums normal Neck: no adenopathy, no carotid bruit, supple, symmetrical, trachea midline and thyroid not enlarged, symmetric, no tenderness/mass/nodules Back: symmetric, no curvature. ROM normal. No CVA tenderness. Lungs: clear to auscultation bilaterally Heart: regular rate and rhythm, S1, S2 normal, no murmur, click, rub or gallop Abdomen: soft, non-tender; bowel sounds normal; no masses,  no organomegaly Pulses: 2+ and symmetric Skin: Skin color, texture, turgor normal. No rashes or lesions Lymph nodes: Cervical, supraclavicular, and axillary nodes normal.  No results found for: HGBA1C  Lab Results  Component Value Date   CREATININE 0.86 02/25/2018   CREATININE 0.86 03/28/2017   CREATININE 0.78 07/04/2016    Lab Results  Component Value Date   WBC 6.7 07/04/2016   HGB 12.7 07/04/2016   HCT 37.7 07/04/2016   PLT 305.0 07/04/2016   GLUCOSE 98 02/25/2018   CHOL 153 07/04/2016   TRIG 94.0 07/04/2016   HDL 46.00 07/04/2016   LDLCALC 89 07/04/2016   ALT 18 02/25/2018   AST 16 02/25/2018   NA 139 02/25/2018   K 4.3 02/25/2018  CL 104 02/25/2018   CREATININE 0.86 02/25/2018   BUN 13 02/25/2018   CO2 26 02/25/2018   TSH 0.89 07/04/2016   MICROALBUR 0.8 07/04/2016    Mm Screening Breast Tomo Bilateral  Result Date: 07/16/2017 CLINICAL DATA:  Screening. EXAM: 2D DIGITAL SCREENING BILATERAL MAMMOGRAM WITH 3D TOMO WITH CAD COMPARISON:  Previous exam(s). ACR Breast Density Category b: There are scattered areas of fibroglandular density. FINDINGS: There are no findings suspicious for  malignancy. Images were processed with CAD. IMPRESSION: No mammographic evidence of malignancy. A result letter of this screening mammogram will be mailed directly to the patient. RECOMMENDATION: Screening mammogram in one year. (Code:[id number]-01Y) BI-RADS CATEGORY  1: Negative. Electronically Signed   By: Lillia Mountain M.D.   On: 07/16/2017 11:55    Assessment & Plan:   Problem List Items Addressed This Visit    Hypertension - Primary    Well controlled on current regimen. Renal function stable, no changes today.  Lab Results  Component Value Date   CREATININE 0.86 02/25/2018   Lab Results  Component Value Date   NA 139 02/25/2018   K 4.3 02/25/2018   CL 104 02/25/2018   CO2 26 02/25/2018         Relevant Orders   Comprehensive metabolic panel (Completed)   Obesity (BMI 30-39.9) (Chronic)    I have addressed  BMI and recommended wt loss of 10% of body weigh over the next 6 months using a low glycemic index diet and regular exercise a minimum of 5 days per week.        Vitamin D deficiency    Recurrent. Supplementing with Drisdol weekly x 6 months       Relevant Orders   VITAMIN D 25 Hydroxy (Vit-D Deficiency, Fractures) (Completed)     A total of 25 minutes of face to face time was spent with patient more than half of which was spent in counselling about the above mentioned conditions  and coordination of care  I have discontinued Shandricka H. Batton's ergocalciferol. I am also having her maintain her losartan, metoprolol succinate, and cholecalciferol.  No orders of the defined types were placed in this encounter.   Medications Discontinued During This Encounter  Medication Reason  . ergocalciferol (DRISDOL) 50000 units capsule Completed Course    Follow-up: Return in about 6 months (around 08/26/2018).   Crecencio Mc, MD

## 2018-02-26 ENCOUNTER — Other Ambulatory Visit: Payer: Self-pay | Admitting: Internal Medicine

## 2018-02-26 MED ORDER — ERGOCALCIFEROL 1.25 MG (50000 UT) PO CAPS
50000.0000 [IU] | ORAL_CAPSULE | ORAL | 1 refills | Status: DC
Start: 2018-02-26 — End: 2019-03-17

## 2018-02-26 NOTE — Assessment & Plan Note (Signed)
Well controlled on current regimen. Renal function stable, no changes today.  Lab Results  Component Value Date   CREATININE 0.86 02/25/2018   Lab Results  Component Value Date   NA 139 02/25/2018   K 4.3 02/25/2018   CL 104 02/25/2018   CO2 26 02/25/2018

## 2018-02-26 NOTE — Assessment & Plan Note (Signed)
Recurrent. Supplementing with Drisdol weekly x 6 months

## 2018-02-26 NOTE — Assessment & Plan Note (Signed)
I have addressed  BMI and recommended wt loss of 10% of body weigh over the next 6 months using a low glycemic index diet and regular exercise a minimum of 5 days per week.   

## 2018-02-26 NOTE — Progress Notes (Signed)
isdol

## 2018-03-19 DIAGNOSIS — L570 Actinic keratosis: Secondary | ICD-10-CM | POA: Diagnosis not present

## 2018-03-19 DIAGNOSIS — D1801 Hemangioma of skin and subcutaneous tissue: Secondary | ICD-10-CM | POA: Diagnosis not present

## 2018-03-19 DIAGNOSIS — L814 Other melanin hyperpigmentation: Secondary | ICD-10-CM | POA: Diagnosis not present

## 2018-03-19 DIAGNOSIS — Z85820 Personal history of malignant melanoma of skin: Secondary | ICD-10-CM | POA: Diagnosis not present

## 2018-06-02 ENCOUNTER — Other Ambulatory Visit: Payer: Self-pay | Admitting: Internal Medicine

## 2018-06-02 DIAGNOSIS — Z1231 Encounter for screening mammogram for malignant neoplasm of breast: Secondary | ICD-10-CM

## 2018-07-01 ENCOUNTER — Other Ambulatory Visit: Payer: Self-pay | Admitting: Internal Medicine

## 2018-07-15 ENCOUNTER — Encounter: Payer: 59 | Admitting: Internal Medicine

## 2018-07-17 ENCOUNTER — Ambulatory Visit
Admission: RE | Admit: 2018-07-17 | Discharge: 2018-07-17 | Disposition: A | Discharge status: Home / Self Care | Payer: 59 | Source: Ambulatory Visit | Attending: Internal Medicine | Admitting: Internal Medicine

## 2018-07-17 DIAGNOSIS — Z1231 Encounter for screening mammogram for malignant neoplasm of breast: Secondary | ICD-10-CM | POA: Diagnosis not present

## 2018-08-28 ENCOUNTER — Encounter: Payer: 59 | Admitting: Internal Medicine

## 2018-08-31 ENCOUNTER — Ambulatory Visit (INDEPENDENT_AMBULATORY_CARE_PROVIDER_SITE_OTHER): Payer: 59 | Admitting: Internal Medicine

## 2018-08-31 ENCOUNTER — Encounter: Payer: Self-pay | Admitting: Internal Medicine

## 2018-08-31 ENCOUNTER — Other Ambulatory Visit (HOSPITAL_COMMUNITY)
Admission: RE | Admit: 2018-08-31 | Discharge: 2018-08-31 | Disposition: A | Discharge status: Home / Self Care | Payer: 59 | Source: Ambulatory Visit | Attending: Internal Medicine | Admitting: Internal Medicine

## 2018-08-31 VITALS — BP 124/88 | HR 88 | Temp 97.4°F | Resp 15 | Ht 63.5 in | Wt 217.2 lb

## 2018-08-31 DIAGNOSIS — N926 Irregular menstruation, unspecified: Secondary | ICD-10-CM

## 2018-08-31 DIAGNOSIS — E559 Vitamin D deficiency, unspecified: Secondary | ICD-10-CM | POA: Diagnosis not present

## 2018-08-31 DIAGNOSIS — E669 Obesity, unspecified: Secondary | ICD-10-CM

## 2018-08-31 DIAGNOSIS — Z9889 Other specified postprocedural states: Secondary | ICD-10-CM

## 2018-08-31 DIAGNOSIS — Z85820 Personal history of malignant melanoma of skin: Secondary | ICD-10-CM

## 2018-08-31 DIAGNOSIS — I10 Essential (primary) hypertension: Secondary | ICD-10-CM

## 2018-08-31 DIAGNOSIS — Z124 Encounter for screening for malignant neoplasm of cervix: Secondary | ICD-10-CM

## 2018-08-31 DIAGNOSIS — Z0000 Encounter for general adult medical examination without abnormal findings: Secondary | ICD-10-CM

## 2018-08-31 LAB — COMPREHENSIVE METABOLIC PANEL
ALT: 17 U/L (ref 0–35)
AST: 14 U/L (ref 0–37)
Albumin: 4.3 g/dL (ref 3.5–5.2)
Alkaline Phosphatase: 97 U/L (ref 39–117)
BUN: 13 mg/dL (ref 6–23)
CO2: 30 mEq/L (ref 19–32)
Calcium: 9.8 mg/dL (ref 8.4–10.5)
Chloride: 101 mEq/L (ref 96–112)
Creatinine, Ser: 0.78 mg/dL (ref 0.40–1.20)
GFR: 76.48 mL/min (ref >60.00)
Glucose, Bld: 84 mg/dL (ref 70–99)
Potassium: 3.7 mEq/L (ref 3.5–5.1)
Sodium: 140 mEq/L (ref 135–145)
Total Bilirubin: 0.6 mg/dL (ref 0.2–1.2)
Total Protein: 7.6 g/dL (ref 6.0–8.3)

## 2018-08-31 LAB — TSH: TSH: 0.53 u[IU]/mL (ref 0.35–4.50)

## 2018-08-31 LAB — LIPID PANEL
Cholesterol: 161 mg/dL (ref 0–200)
HDL: 51 mg/dL (ref >39.00)
LDL Cholesterol: 91 mg/dL (ref 0–99)
NonHDL: 109.58
Total CHOL/HDL Ratio: 3
Triglycerides: 93 mg/dL (ref 0.0–149.0)
VLDL: 18.6 mg/dL (ref 0.0–40.0)

## 2018-08-31 LAB — VITAMIN D 25 HYDROXY (VIT D DEFICIENCY, FRACTURES): VITD: 45.2 ng/mL (ref 30.00–100.00)

## 2018-08-31 MED ORDER — ZOSTER VAC RECOMB ADJUVANTED 50 MCG/0.5ML IM SUSR: 0.5000 mL | Freq: Once | INTRAMUSCULAR | 1 refills | Status: AC

## 2018-08-31 NOTE — Progress Notes (Signed)
Patient ID: Kathryn Torres, female    DOB: 1960-10-14  Age: 58 y.o. MRN: [id number]  The patient is here for annual preventive examination and management of other chronic and acute problems.   The risk factors are reflected in the social history.  The roster of all physicians providing medical care to patient - is listed in the Snapshot section of the chart.  Activities of daily living:  The patient is 100% independent in all ADLs: dressing, toileting, feeding as well as independent mobility  Home safety : The patient has smoke detectors in the home. They wear seatbelts.  There are no firearms at home. There is no violence in the home.   There is no risks for hepatitis, STDs or HIV. There is no   history of blood transfusion. They have no travel history to infectious disease endemic areas of the world.  The patient has seen their dentist in the last six month. They have seen their eye doctor in the last year. They admit to slight hearing difficulty with regard to whispered voices and some television programs.  They have deferred audiologic testing in the last year.  They do not  have excessive sun exposure. Discussed the need for sun protection: hats, long sleeves and use of sunscreen if there is significant sun exposure.   Diet: the importance of a healthy diet is discussed. They do have a healthy diet.  The benefits of regular aerobic exercise were discussed. She walks 4 times per week ,  20 minutes.   Depression screen: there are no signs or vegative symptoms of depression- irritability, change in appetite, anhedonia, sadness/tearfullness.  Cognitive assessment: the patient manages all their financial and personal affairs and is actively engaged. They could relate day,date,year and events; recalled 2/3 objects at 3 minutes; performed clock-face test normally.  The following portions of the patient's history were reviewed and updated as appropriate: allergies, current medications, past family  history, past medical history,  past surgical history, past social history  and problem list.  Visual acuity was not assessed per patient preference since she has regular follow up with her ophthalmologist. Hearing and body mass index were assessed and reviewed.   During the course of the visit the patient was educated and counseled about appropriate screening and preventive services including : fall prevention , diabetes screening, nutrition counseling, colorectal cancer screening, and recommended immunizations.    CC: The primary encounter diagnosis was Papanicolaou smear for cervical cancer screening. Diagnoses of Essential hypertension, Vitamin D deficiency, Irregular menses, Obesity (BMI 30-39.9), History of melanoma excision, and Visit for preventive health examination were also pertinent to this visit.  Perimenopause:  She has become frustrated by the recurrence of light menses every several months.  She has a 56 yr old daughter who lives with her.   History Kathryn Torres has a past medical history of Hypertension, melanoma (2006), and Previous pregnancy with hemolysis, elevated liver enzymes, and low platelet (HELLP) syndrome, antepartum (2002).   She has a past surgical history that includes Cesarean section (2002) and Breast biopsy (Right, 1998 and 2005).   Her family history includes Breast cancer (age of onset: 68) in her mother; Cancer in her father and mother; Deep vein thrombosis in her maternal grandmother; Hypertension in her mother; Stroke (age of onset: 64) in her paternal grandfather.She reports that she has never smoked. She has never used smokeless tobacco. She reports that she does not drink alcohol or use drugs.  Outpatient Medications Prior to Visit  Medication  Sig Dispense Refill  . ergocalciferol (DRISDOL) 50000 units capsule Take 1 capsule (50,000 Units total) by mouth once a week. 12 capsule 1  . losartan (COZAAR) 50 MG tablet TAKE 1 TABLET BY MOUTH DAILY. 90 tablet 1  .  metoprolol succinate (TOPROL-XL) 25 MG 24 hr tablet TAKE 1 TABLET (25 MG TOTAL) BY MOUTH DAILY. 90 tablet 1  . cholecalciferol (VITAMIN D) 1000 units tablet Take 1,000 Units by mouth daily.     No facility-administered medications prior to visit.     Review of Systems   Patient denies headache, fevers, malaise, unintentional weight loss, skin rash, eye pain, sinus congestion and sinus pain, sore throat, dysphagia,  hemoptysis , cough, dyspnea, wheezing, chest pain, palpitations, orthopnea, edema, abdominal pain, nausea, melena, diarrhea, constipation, flank pain, dysuria, hematuria, urinary  Frequency, nocturia, numbness, tingling, seizures,  Focal weakness, Loss of consciousness,  Tremor, insomnia, depression, anxiety, and suicidal ideation.      Objective:  BP 124/88 (BP Location: Left Arm, Patient Position: Sitting, Cuff Size: Large)   Pulse 88   Temp (!) 97.4 F (36.3 C) (Oral)   Resp 15   Ht 5' 3.5" (1.613 m)   Wt 217 lb 3.2 oz (98.5 kg)   SpO2 95%   BMI 37.87 kg/m   Physical Exam  General Appearance:    Alert, cooperative, no distress, appears stated age  Head:    Normocephalic, without obvious abnormality, atraumatic  Eyes:    PERRL, conjunctiva/corneas clear, EOM's intact, fundi    benign, both eyes  Ears:    Normal TM's and external ear canals, both ears  Nose:   Nares normal, septum midline, mucosa normal, no drainage    or sinus tenderness  Throat:   Lips, mucosa, and tongue normal; teeth and gums normal  Neck:   Supple, symmetrical, trachea midline, no adenopathy;    thyroid:  no enlargement/tenderness/nodules; no carotid   bruit or JVD  Back:     Symmetric, no curvature, ROM normal, no CVA tenderness  Lungs:     Clear to auscultation bilaterally, respirations unlabored  Chest Wall:    No tenderness or deformity   Heart:    Regular rate and rhythm, S1 and S2 normal, no murmur, rub   or gallop  Breast Exam:    No tenderness, masses, or nipple abnormality   Abdomen:     Soft, non-tender, bowel sounds active all four quadrants,    no masses, no organomegaly  Genitalia:    Pelvic: cervix normal in appearance, external genitalia normal, no adnexal masses or tenderness, no cervical motion tenderness, rectovaginal septum normal, uterus normal size, shape, and consistency and vagina normal without discharge  Extremities:   Extremities normal, atraumatic, no cyanosis or edema  Pulses:   2+ and symmetric all extremities  Skin:   Skin color, texture, turgor normal, no rashes or lesions  Lymph nodes:   Cervical, supraclavicular, and axillary nodes normal  Neurologic:   CNII-XII intact, normal strength, sensation and reflexes    throughout      Assessment & Plan:   Problem List Items Addressed This Visit    Obesity (BMI 30-39.9) (Chronic)    I have addressed  BMI and recommended wt loss of 10% of body weigh over the next 6 months using a low glycemic index diet and regular exercise a minimum of 5 days per week.        Hypertension    Well controlled on current regimen. Renal function stable, no changes today.  Lab Results  Component Value Date   CREATININE 0.78 08/31/2018   Lab Results  Component Value Date   NA 140 08/31/2018   K 3.7 08/31/2018   CL 101 08/31/2018   CO2 30 08/31/2018         Relevant Orders   Lipid panel (Completed)   Comprehensive metabolic panel (Completed)   History of melanoma excision    From lower back,  With no recurrence.  She continues to have  6 month follow up with dermatology and wears sun block whenever outside.      Visit for preventive health examination    Annual comprehensive preventive exam was done as well as an evaluation and management of chronic conditions .  During the course of the visit the patient was educated and counseled about appropriate screening and preventive services including :  diabetes screening, lipid analysis with projected  10 year  risk for CAD  , , nutrition counseling,  colorectal cancer screening, and recommended immunizations.  Printed recommendations for health maintenance screenings was given.        Vitamin D deficiency   Relevant Orders   VITAMIN D 25 Hydroxy (Vit-D Deficiency, Fractures) (Completed)   Irregular menses    Secondary to perimenopause and daughter's cycle      Relevant Orders   TSH (Completed)    Other Visit Diagnoses    Papanicolaou smear for cervical cancer screening    -  Primary   Relevant Orders   Cytology - PAP( [place])      I am having Nguyet H. Standish start on Zoster Vaccine Adjuvanted. I am also having her maintain her cholecalciferol, ergocalciferol, metoprolol succinate, and losartan.  Meds ordered this encounter  Medications  . Zoster Vaccine Adjuvanted [facility]) injection    Sig: Inject 0.5 mLs into the muscle once for 1 dose.    Dispense:  1 each    Refill:  1    There are no discontinued medications.  Follow-up: Return in about 6 months (around 03/03/2019).   Crecencio Mc, MD

## 2018-08-31 NOTE — Patient Instructions (Signed)
I want you to start walking for 15 minutes daily  To help improve your health   I Recommend  finding a  Low Carb high Protein premixed Shakes for breakfast:   Premier Protein  Atkins Advantage Muscle Milk EAS AdvantEdge  FairLife   All of these are available at [employer], [place]  And taste great    Preventing Health Risks of Being Overweight Maintaining a healthy body weight is an important part of your overall health. Your healthy body weight depends on your age, gender, and height. Being overweight puts you at risk for many health problems, including:  Heart disease.  Diabetes.  Problems sleeping.  Joint problems. You can make changes to your diet and lifestyle to prevent these risks. Consider working with a health care provider or a dietitian to make these changes. What nutrition changes can be made?   Eat only as much as your body needs. In most cases, this is about 2,000 calories a day, but the amount varies depending on your height, gender, and activity level. Ask your health care provider how many calories you should have each day. Eating more than your body needs on a regular basis can cause you to become overweight or obese.  Eat slowly, and stop eating when you feel full.  Choose healthy foods, including: ? Fruits and vegetables. ? Lean meats. ? Low-fat dairy products. ? High-fiber foods, such as whole grains and beans. ? Healthy snacks like vegetable sticks, a piece of fruit, or a small amount of yogurt or cheese.  Avoid foods and drinks that are high in sugar, salt (sodium), saturated fat, or trans fat. This includes: ? Many desserts such as candy, cookies, and ice cream. ? Soda. ? Fried foods. ? Processed meats such as hot dogs or lunch meats. ? Prepackaged snack foods. What lifestyle changes can be made?   Exercise for at least 150 minutes a week to prevent weight gain, or as often as recommended by your health care provider.  Do moderate-intensity exercise, such as brisk walking. ? Spread it out by exercising for 30 minutes 5 days a week, or in short 10-minute bursts several times a day.  Find other ways to stay active and burn calories, such as yard work or a hobby that involves physical activity.  Get at least 8 hours of sleep each night. When you are well-rested, you are more likely to be active and make healthy choices during the day. To sleep better: ? Try to go to bed and wake up at about the same time every day. ? Keep your bedroom dark, quiet, and cool. ? Make sure that your bed is comfortable. ? Avoid stimulating activities, such as watching television or exercising, for at least one hour before bedtime. Why are these changes important? Eating healthy and being active helps you lose weight and prevent health problems caused by being overweight. Making these changes can also help you manage stress, feel better mentally, and connect with friends and family. What can happen if changes are not made? Being overweight can affect you for your entire life. You may develop joint or bone problems that make it painful or difficult for you to play sports or do activities you enjoy. Being overweight puts stress on your heart and lungs and can lead to medical problems like diabetes, heart disease, and sleeping problems. Where to find support You can get support for preventing health risks of being overweight from:  Your health  care provider or a dietitian. They can provide guidance about healthy eating and healthy lifestyle choices.  Weight loss support groups, online or in-person. Where to find more information  MyPlate: FormerBoss.no ? This an online tool that provides personalized recommendations about foods to eat each day.  The Centers for Disease Control and Prevention: http://sharp-hammond.biz/ ? This resource gives tips for managing weight and having an active lifestyle. Summary  To prevent  unhealthy weight gain, it is important to maintain a healthy diet high in vegetables and whole grains, exercise regularly, and get at least 8 hours of sleep each night.  Making these changes helps prevent many long-term (chronic) health conditions that can shorten your life, such as diabetes, heart disease, and stroke. This information is not intended to replace advice given to you by your health care provider. Make sure you discuss any questions you have with your health care provider. Document Released: 05/07/2017 Document Revised: 05/07/2017 Document Reviewed: 05/07/2017 Elsevier Interactive Patient Education  2019 [employer].

## 2018-09-01 LAB — CYTOLOGY - PAP
Diagnosis: NEGATIVE
HPV: NOT DETECTED

## 2018-09-01 NOTE — Assessment & Plan Note (Signed)
I have addressed  BMI and recommended wt loss of 10% of body weigh over the next 6 months using a low glycemic index diet and regular exercise a minimum of 5 days per week.   

## 2018-09-01 NOTE — Assessment & Plan Note (Signed)
Annual comprehensive preventive exam was done as well as an evaluation and management of chronic conditions .  During the course of the visit the patient was educated and counseled about appropriate screening and preventive services including :  diabetes screening, lipid analysis with projected  10 year  risk for CAD  , , nutrition counseling, colorectal cancer screening, and recommended immunizations.  Printed recommendations for health maintenance screenings was given.   

## 2018-09-01 NOTE — Assessment & Plan Note (Addendum)
Secondary to perimenopause and daughter's cycle

## 2018-09-01 NOTE — Assessment & Plan Note (Signed)
Well controlled on current regimen. Renal function stable, no changes today.  Lab Results  Component Value Date   CREATININE 0.78 08/31/2018   Lab Results  Component Value Date   NA 140 08/31/2018   K 3.7 08/31/2018   CL 101 08/31/2018   CO2 30 08/31/2018

## 2018-09-01 NOTE — Assessment & Plan Note (Signed)
From lower back,  With no recurrence.  She continues to have  6 month follow up with dermatology and wears sun block whenever outside.

## 2018-09-05 ENCOUNTER — Other Ambulatory Visit: Payer: Self-pay | Admitting: Internal Medicine

## 2018-09-05 MED ORDER — PREDNISONE 10 MG PO TABS: ORAL_TABLET | ORAL | 0 refills | Status: DC

## 2018-09-16 ENCOUNTER — Other Ambulatory Visit: Payer: Self-pay | Admitting: Internal Medicine

## 2018-12-21 DIAGNOSIS — L718 Other rosacea: Secondary | ICD-10-CM | POA: Diagnosis not present

## 2018-12-21 DIAGNOSIS — Z85820 Personal history of malignant melanoma of skin: Secondary | ICD-10-CM | POA: Diagnosis not present

## 2018-12-21 DIAGNOSIS — S20362A Insect bite (nonvenomous) of left front wall of thorax, initial encounter: Secondary | ICD-10-CM | POA: Diagnosis not present

## 2018-12-21 DIAGNOSIS — L821 Other seborrheic keratosis: Secondary | ICD-10-CM | POA: Diagnosis not present

## 2019-01-01 DIAGNOSIS — H52203 Unspecified astigmatism, bilateral: Secondary | ICD-10-CM | POA: Diagnosis not present

## 2019-03-03 ENCOUNTER — Encounter: Payer: Self-pay | Admitting: Emergency Medicine

## 2019-03-03 ENCOUNTER — Other Ambulatory Visit: Payer: Self-pay

## 2019-03-03 ENCOUNTER — Emergency Department
Admission: EM | Admit: 2019-03-03 | Discharge: 2019-03-03 | Disposition: A | Discharge status: Home / Self Care | Payer: 59 | Source: Home / Self Care | Attending: Emergency Medicine | Admitting: Emergency Medicine

## 2019-03-03 ENCOUNTER — Ambulatory Visit: Payer: 59 | Admitting: Internal Medicine

## 2019-03-03 ENCOUNTER — Emergency Department: Payer: 59

## 2019-03-03 DIAGNOSIS — W109XXA Fall (on) (from) unspecified stairs and steps, initial encounter: Secondary | ICD-10-CM | POA: Insufficient documentation

## 2019-03-03 DIAGNOSIS — Y929 Unspecified place or not applicable: Secondary | ICD-10-CM | POA: Insufficient documentation

## 2019-03-03 DIAGNOSIS — J690 Pneumonitis due to inhalation of food and vomit: Secondary | ICD-10-CM | POA: Diagnosis not present

## 2019-03-03 DIAGNOSIS — R531 Weakness: Secondary | ICD-10-CM | POA: Diagnosis not present

## 2019-03-03 DIAGNOSIS — R110 Nausea: Secondary | ICD-10-CM | POA: Diagnosis not present

## 2019-03-03 DIAGNOSIS — S0990XA Unspecified injury of head, initial encounter: Secondary | ICD-10-CM | POA: Diagnosis not present

## 2019-03-03 DIAGNOSIS — R197 Diarrhea, unspecified: Secondary | ICD-10-CM

## 2019-03-03 DIAGNOSIS — E559 Vitamin D deficiency, unspecified: Secondary | ICD-10-CM | POA: Diagnosis not present

## 2019-03-03 DIAGNOSIS — Z85820 Personal history of malignant melanoma of skin: Secondary | ICD-10-CM | POA: Diagnosis not present

## 2019-03-03 DIAGNOSIS — K529 Noninfective gastroenteritis and colitis, unspecified: Secondary | ICD-10-CM

## 2019-03-03 DIAGNOSIS — Z79899 Other long term (current) drug therapy: Secondary | ICD-10-CM | POA: Diagnosis not present

## 2019-03-03 DIAGNOSIS — J129 Viral pneumonia, unspecified: Secondary | ICD-10-CM | POA: Diagnosis not present

## 2019-03-03 DIAGNOSIS — Y939 Activity, unspecified: Secondary | ICD-10-CM | POA: Insufficient documentation

## 2019-03-03 DIAGNOSIS — Z91041 Radiographic dye allergy status: Secondary | ICD-10-CM | POA: Diagnosis not present

## 2019-03-03 DIAGNOSIS — R42 Dizziness and giddiness: Secondary | ICD-10-CM | POA: Diagnosis not present

## 2019-03-03 DIAGNOSIS — Z801 Family history of malignant neoplasm of trachea, bronchus and lung: Secondary | ICD-10-CM | POA: Diagnosis not present

## 2019-03-03 DIAGNOSIS — Y999 Unspecified external cause status: Secondary | ICD-10-CM | POA: Insufficient documentation

## 2019-03-03 DIAGNOSIS — H8110 Benign paroxysmal vertigo, unspecified ear: Secondary | ICD-10-CM

## 2019-03-03 DIAGNOSIS — I10 Essential (primary) hypertension: Secondary | ICD-10-CM | POA: Insufficient documentation

## 2019-03-03 DIAGNOSIS — R112 Nausea with vomiting, unspecified: Secondary | ICD-10-CM | POA: Diagnosis not present

## 2019-03-03 DIAGNOSIS — R1111 Vomiting without nausea: Secondary | ICD-10-CM | POA: Diagnosis not present

## 2019-03-03 DIAGNOSIS — A09 Infectious gastroenteritis and colitis, unspecified: Secondary | ICD-10-CM | POA: Diagnosis not present

## 2019-03-03 DIAGNOSIS — U071 COVID-19: Secondary | ICD-10-CM | POA: Diagnosis not present

## 2019-03-03 DIAGNOSIS — K8020 Calculus of gallbladder without cholecystitis without obstruction: Secondary | ICD-10-CM | POA: Diagnosis not present

## 2019-03-03 LAB — URINALYSIS, COMPLETE (UACMP) WITH MICROSCOPIC
Bacteria, UA: NONE SEEN
Bilirubin Urine: NEGATIVE
Glucose, UA: NEGATIVE mg/dL
Hgb urine dipstick: NEGATIVE
Ketones, ur: NEGATIVE mg/dL
Leukocytes,Ua: NEGATIVE
Nitrite: NEGATIVE
Protein, ur: NEGATIVE mg/dL
Specific Gravity, Urine: 1.004 — ABNORMAL LOW (ref 1.005–1.030)
pH: 7 (ref 5.0–8.0)

## 2019-03-03 LAB — COMPREHENSIVE METABOLIC PANEL
ALT: 48 U/L — ABNORMAL HIGH (ref 0–44)
AST: 56 U/L — ABNORMAL HIGH (ref 15–41)
Albumin: 4 g/dL (ref 3.5–5.0)
Alkaline Phosphatase: 97 U/L (ref 38–126)
Anion gap: 15 (ref 5–15)
BUN: 13 mg/dL (ref 6–20)
CO2: 22 mmol/L (ref 22–32)
Calcium: 9.6 mg/dL (ref 8.9–10.3)
Chloride: 102 mmol/L (ref 98–111)
Creatinine, Ser: 0.88 mg/dL (ref 0.44–1.00)
GFR calc Af Amer: >60 mL/min (ref >60)
GFR calc non Af Amer: >60 mL/min (ref >60)
Glucose, Bld: 153 mg/dL — ABNORMAL HIGH (ref 70–99)
Potassium: 3.6 mmol/L (ref 3.5–5.1)
Sodium: 139 mmol/L (ref 135–145)
Total Bilirubin: 0.8 mg/dL (ref 0.3–1.2)
Total Protein: 8 g/dL (ref 6.5–8.1)

## 2019-03-03 LAB — CBC
HCT: 43.7 % (ref 36.0–46.0)
Hemoglobin: 14.7 g/dL (ref 12.0–15.0)
MCH: 26.5 pg (ref 26.0–34.0)
MCHC: 33.6 g/dL (ref 30.0–36.0)
MCV: 78.7 fL — ABNORMAL LOW (ref 80.0–100.0)
Platelets: 205 10*3/uL (ref 150–400)
RBC: 5.55 MIL/uL — ABNORMAL HIGH (ref 3.87–5.11)
RDW: 13.1 % (ref 11.5–15.5)
WBC: 7.8 10*3/uL (ref 4.0–10.5)
nRBC: 0 % (ref 0.0–0.2)

## 2019-03-03 LAB — TROPONIN I (HIGH SENSITIVITY): Troponin I (High Sensitivity): 4 ng/L (ref <18)

## 2019-03-03 LAB — LIPASE, BLOOD: Lipase: 20 U/L (ref 11–51)

## 2019-03-03 MED ORDER — MECLIZINE HCL 25 MG PO TABS: 25.0000 mg | ORAL_TABLET | Freq: Three times a day (TID) | ORAL | 0 refills | Status: DC | PRN

## 2019-03-03 MED ORDER — PROMETHAZINE HCL 12.5 MG PO TABS: 12.5000 mg | ORAL_TABLET | Freq: Four times a day (QID) | ORAL | 0 refills | Status: DC | PRN

## 2019-03-03 MED ORDER — LACTATED RINGERS IV BOLUS
1000.0000 mL | Freq: Once | INTRAVENOUS | Status: AC
  Administered 2019-03-03: 18:00:00 1000 mL via INTRAVENOUS

## 2019-03-03 MED ORDER — LACTATED RINGERS IV BOLUS
1000.0000 mL | Freq: Once | INTRAVENOUS | Status: AC
  Administered 2019-03-03: 1000 mL via INTRAVENOUS

## 2019-03-03 MED ORDER — PROMETHAZINE HCL 25 MG/ML IJ SOLN
25.0000 mg | Freq: Once | INTRAMUSCULAR | Status: AC
  Administered 2019-03-03: 18:00:00 25 mg via INTRAVENOUS
  Filled 2019-03-03: qty 1

## 2019-03-03 MED ORDER — MECLIZINE HCL 25 MG PO TABS
25.0000 mg | ORAL_TABLET | Freq: Once | ORAL | Status: AC
  Administered 2019-03-03: 19:00:00 25 mg via ORAL
  Filled 2019-03-03: qty 1

## 2019-03-03 MED ORDER — ONDANSETRON HCL 4 MG/2ML IJ SOLN
4.0000 mg | Freq: Once | INTRAMUSCULAR | Status: AC | PRN
  Administered 2019-03-03: 16:00:00 4 mg via INTRAVENOUS
  Filled 2019-03-03: qty 2

## 2019-03-03 NOTE — ED Provider Notes (Signed)
[facility] Emergency Department Provider Note   ____________________________________________   First MD Initiated Contact with Patient 03/03/19 1517     (approximate)  I have reviewed the triage vital signs and the nursing notes.   HISTORY  Chief Complaint Nausea    HPI Kathryn Torres is a 58 y.o. female with past medical history of hypertension who presents to the ED complaining of nausea and vomiting.  Patient reports that for approximately the past 5 days she has been having persistent vomiting and diarrhea.  She denies any blood in her vomit or stool and has not had any abdominal pain associated with this.  She did have an episode earlier today where she was feeling very lightheaded and sat down on the steps.  Her husband then reported that she passed out and fell down some of the steps, hitting her head.  Patient denies any headache or neck pain, denies any injuries to her extremities.  She has not had any recent fevers, chills, chest pain, cough, or shortness of breath.        Past Medical History:  Diagnosis Date  . Hypertension   . melanoma 2006   lower back  . Previous pregnancy with hemolysis, elevated liver enzymes, and low platelet (HELLP) syndrome, antepartum 2002   s/p c section     Patient Active Problem List   Diagnosis Date Noted  . Allergic rhinitis 03/30/2017  . Irregular menses 06/23/2014  . Vitamin D deficiency 05/25/2013  . Visit for preventive health examination 05/18/2012  . Symptomatic cholelithiasis 05/18/2012  . Obesity (BMI 30-39.9) 11/18/2011  . Previous pregnancy with hemolysis, elevated liver enzymes, and low platelet (HELLP) syndrome, antepartum   . Hypertension   . History of melanoma excision     Past Surgical History:  Procedure Laterality Date  . BREAST BIOPSY Right 1998 and 2005   benign  . CESAREAN SECTION  2002    Prior to Admission medications   Medication Sig Start Date End Date Taking?  Authorizing Provider  cholecalciferol (VITAMIN D) 1000 units tablet Take 1,000 Units by mouth daily.    [provider]  ergocalciferol (DRISDOL) 50000 units capsule Take 1 capsule (50,000 Units total) by mouth once a week. 02/26/18   Crecencio Mc, MD  losartan (COZAAR) 50 MG tablet TAKE 1 TABLET BY MOUTH DAILY. 09/16/18   Crecencio Mc, MD  meclizine (ANTIVERT) 25 MG tablet Take 1 tablet (25 mg total) by mouth 3 (three) times daily as needed for dizziness. 03/03/19   Blake Divine, MD  metoprolol succinate (TOPROL-XL) 25 MG 24 hr tablet TAKE 1 TABLET (25 MG TOTAL) BY MOUTH DAILY. 09/16/18   Crecencio Mc, MD  predniSONE (DELTASONE) 10 MG tablet 6 tablets on Day 1 , then reduce by 1 tablet daily until gone 09/05/18   Crecencio Mc, MD  promethazine (PHENERGAN) 12.5 MG tablet Take 1 tablet (12.5 mg total) by mouth every 6 (six) hours as needed for nausea or vomiting. 03/03/19   Blake Divine, MD    Allergies Contrast media [iodinated diagnostic agents]  Family History  Problem Relation Age of Onset  . Cancer Mother        Breast and Lung Ca, social smoker   . Hypertension Mother   . Breast cancer Mother 88  . Cancer Father        Lung Ca, smoker  . Stroke Paternal Grandfather 71       CVA ,    . Deep  vein thrombosis Maternal Grandmother     Social History Social History   Tobacco Use  . Smoking status: Never Smoker  . Smokeless tobacco: Never Used  Substance Use Topics  . Alcohol use: No  . Drug use: No    Review of Systems  Constitutional: No fever/chills Eyes: No visual changes. ENT: No sore throat. Cardiovascular: Denies chest pain. Respiratory: Denies shortness of breath. Gastrointestinal: No abdominal pain.  Positive for nausea, vomiting, and diarrhea.  No constipation. Genitourinary: Negative for dysuria. Musculoskeletal: Negative for back pain. Skin: Negative for rash. Neurological: Negative for headaches, focal weakness or numbness.   ____________________________________________   PHYSICAL EXAM:  VITAL SIGNS: ED Triage Vitals  Enc Vitals Group     BP 03/03/19 1523 131/81     Pulse Rate 03/03/19 1523 97     Resp 03/03/19 1523 18     Temp 03/03/19 1523 97.8 F (36.6 C)     Temp Source 03/03/19 1523 Oral     SpO2 03/03/19 1523 95 %     Weight 03/03/19 1525 196 lb 11.2 oz (89.2 kg)     Height 03/03/19 1525 5\' 3"  (1.6 m)     Head Circumference --      Peak Flow --      Pain Score 03/03/19 1525 0     Pain Loc --      Pain Edu? --      Excl. in [place]? --     Constitutional: Alert and oriented. Eyes: Conjunctivae are normal. Head: Small abrasion to left posterior scalp with small amount of dried blood. Nose: No congestion/rhinnorhea. Mouth/Throat: Mucous membranes are moist. Neck: Normal ROM Cardiovascular: Normal rate, regular rhythm. Grossly normal heart sounds. Respiratory: Normal respiratory effort.  No retractions. Lungs CTAB. Gastrointestinal: Soft and nontender. No distention. Genitourinary: deferred Musculoskeletal: No lower extremity tenderness nor edema. Neurologic:  Normal speech and language. No gross focal neurologic deficits are appreciated. Skin:  Skin is warm, dry and intact. No rash noted. Psychiatric: Mood and affect are normal. Speech and behavior are normal.  ____________________________________________   LABS (all labs ordered are listed, but only abnormal results are displayed)  Labs Reviewed  COMPREHENSIVE METABOLIC PANEL - Abnormal; Notable for the following components:      Result Value   Glucose, Bld 153 (*)    AST 56 (*)    ALT 48 (*)    All other components within normal limits  CBC - Abnormal; Notable for the following components:   RBC 5.55 (*)    MCV 78.7 (*)    All other components within normal limits  URINALYSIS, COMPLETE (UACMP) WITH MICROSCOPIC - Abnormal; Notable for the following components:   Color, Urine STRAW (*)    APPearance CLEAR (*)    Specific Gravity,  Urine 1.004 (*)    All other components within normal limits  LIPASE, BLOOD  TROPONIN I (HIGH SENSITIVITY)  TROPONIN I (HIGH SENSITIVITY)   ____________________________________________  EKG  ED ECG REPORT I, Blake Divine, the attending physician, personally viewed and interpreted this ECG.   Date: 03/03/2019  EKG Time: 15:24  Rate: 96  Rhythm: normal sinus rhythm  Axis: LAD  Intervals:left anterior fascicular block  ST&T Change: None    PROCEDURES  Procedure(s) performed (including Critical Care):  Procedures   ____________________________________________   INITIAL IMPRESSION / ASSESSMENT AND PLAN / ED COURSE       58 year old female presents to the ED following 4 to 5 days of vomiting and diarrhea, felt lightheaded earlier  today with syncopal episode and fall striking her head.  No obvious traumatic injuries at this time patient declines CT of her head.  Suspect syncopal episode secondary to dehydration, will check labs and UA.  She is postmenopausal.  She has a benign and nonfocal abdominal exam, doubt acute intra-abdominal surgical pathology.  Patient reports some improvement in nausea and lightheadedness following Zofran and IV fluid bolus.  She is requesting additional nausea medication, will give Phenergan.  She is also requesting that we perform a head CT.  Initial labs unremarkable, awaiting troponin.  Troponin negative, very low suspicion for cardiac cause to patient syncopal episode, dehydration more likely.  She reports nausea and lightheadedness improved, but now feels quite dizzy with a sensation of the room spinning around her.  Patient does report a history of vertigo and has taken meclizine in the past.  Doubt posterior stroke given symptoms are very positional.  Patient had improvement in meclizine, will prescribe meclizine and Phenergan for home use.  Counseled patient to follow-up with her PCP and return to the ED for new or worsening symptoms.  Patient  agrees with plan.      ____________________________________________   FINAL CLINICAL IMPRESSION(S) / ED DIAGNOSES  Final diagnoses:  Gastroenteritis  Diarrhea of presumed infectious origin  Benign paroxysmal positional vertigo, unspecified laterality     ED Discharge Orders         Ordered    promethazine (PHENERGAN) 12.5 MG tablet  Every 6 hours PRN     03/03/19 1934    meclizine (ANTIVERT) 25 MG tablet  3 times daily PRN     03/03/19 1934           Note:  This document was prepared using Dragon voice recognition software and may include unintentional dictation errors.   Blake Divine, MD 03/03/19 [phone]

## 2019-03-03 NOTE — ED Notes (Signed)
Patient transported to CT 

## 2019-03-03 NOTE — ED Notes (Signed)
Assisted pt to the toilet. ?

## 2019-03-03 NOTE — ED Triage Notes (Signed)
Pt arrived via [facility] c/o N/V/D x 4-5 days. Pt states "husband said I fainted this morning, about 11 am"

## 2019-03-05 ENCOUNTER — Inpatient Hospital Stay: Payer: 59

## 2019-03-05 ENCOUNTER — Inpatient Hospital Stay
Admission: EM | Admit: 2019-03-05 | Discharge: 2019-03-05 | DRG: 177 | Disposition: A | Discharge status: Other Acute Inpatient Hospital | Payer: 59 | Attending: Internal Medicine | Admitting: Internal Medicine

## 2019-03-05 ENCOUNTER — Inpatient Hospital Stay (HOSPITAL_COMMUNITY)
Admission: AD | Admit: 2019-03-05 | Discharge: 2019-03-09 | DRG: 177 | Disposition: A | Discharge status: Home / Self Care | Payer: 59 | Source: Other Acute Inpatient Hospital | Attending: Family Medicine | Admitting: Family Medicine

## 2019-03-05 ENCOUNTER — Emergency Department: Payer: 59

## 2019-03-05 ENCOUNTER — Encounter: Payer: Self-pay | Admitting: Emergency Medicine

## 2019-03-05 ENCOUNTER — Other Ambulatory Visit: Payer: Self-pay

## 2019-03-05 ENCOUNTER — Encounter (HOSPITAL_COMMUNITY): Payer: Self-pay

## 2019-03-05 DIAGNOSIS — U071 COVID-19: Secondary | ICD-10-CM

## 2019-03-05 DIAGNOSIS — Z91041 Radiographic dye allergy status: Secondary | ICD-10-CM | POA: Diagnosis not present

## 2019-03-05 DIAGNOSIS — Z79899 Other long term (current) drug therapy: Secondary | ICD-10-CM

## 2019-03-05 DIAGNOSIS — R112 Nausea with vomiting, unspecified: Secondary | ICD-10-CM | POA: Diagnosis not present

## 2019-03-05 DIAGNOSIS — R7989 Other specified abnormal findings of blood chemistry: Secondary | ICD-10-CM | POA: Diagnosis not present

## 2019-03-05 DIAGNOSIS — R5381 Other malaise: Secondary | ICD-10-CM | POA: Diagnosis not present

## 2019-03-05 DIAGNOSIS — J9601 Acute respiratory failure with hypoxia: Secondary | ICD-10-CM | POA: Diagnosis present

## 2019-03-05 DIAGNOSIS — A09 Infectious gastroenteritis and colitis, unspecified: Secondary | ICD-10-CM | POA: Diagnosis present

## 2019-03-05 DIAGNOSIS — R42 Dizziness and giddiness: Secondary | ICD-10-CM | POA: Diagnosis not present

## 2019-03-05 DIAGNOSIS — Z803 Family history of malignant neoplasm of breast: Secondary | ICD-10-CM | POA: Diagnosis not present

## 2019-03-05 DIAGNOSIS — Z8249 Family history of ischemic heart disease and other diseases of the circulatory system: Secondary | ICD-10-CM | POA: Diagnosis not present

## 2019-03-05 DIAGNOSIS — E559 Vitamin D deficiency, unspecified: Secondary | ICD-10-CM | POA: Diagnosis not present

## 2019-03-05 DIAGNOSIS — R0602 Shortness of breath: Secondary | ICD-10-CM | POA: Diagnosis not present

## 2019-03-05 DIAGNOSIS — J690 Pneumonitis due to inhalation of food and vomit: Secondary | ICD-10-CM

## 2019-03-05 DIAGNOSIS — Z801 Family history of malignant neoplasm of trachea, bronchus and lung: Secondary | ICD-10-CM | POA: Diagnosis not present

## 2019-03-05 DIAGNOSIS — D72819 Decreased white blood cell count, unspecified: Secondary | ICD-10-CM | POA: Diagnosis present

## 2019-03-05 DIAGNOSIS — J129 Viral pneumonia, unspecified: Secondary | ICD-10-CM | POA: Diagnosis not present

## 2019-03-05 DIAGNOSIS — E669 Obesity, unspecified: Secondary | ICD-10-CM | POA: Diagnosis not present

## 2019-03-05 DIAGNOSIS — R197 Diarrhea, unspecified: Secondary | ICD-10-CM

## 2019-03-05 DIAGNOSIS — J1289 Other viral pneumonia: Secondary | ICD-10-CM | POA: Diagnosis present

## 2019-03-05 DIAGNOSIS — R740 Nonspecific elevation of levels of transaminase and lactic acid dehydrogenase [LDH]: Secondary | ICD-10-CM | POA: Diagnosis present

## 2019-03-05 DIAGNOSIS — K8020 Calculus of gallbladder without cholecystitis without obstruction: Secondary | ICD-10-CM | POA: Diagnosis not present

## 2019-03-05 DIAGNOSIS — J22 Unspecified acute lower respiratory infection: Secondary | ICD-10-CM

## 2019-03-05 DIAGNOSIS — K529 Noninfective gastroenteritis and colitis, unspecified: Secondary | ICD-10-CM | POA: Diagnosis not present

## 2019-03-05 DIAGNOSIS — I10 Essential (primary) hypertension: Secondary | ICD-10-CM | POA: Diagnosis present

## 2019-03-05 DIAGNOSIS — J1282 Pneumonia due to coronavirus disease 2019: Secondary | ICD-10-CM

## 2019-03-05 DIAGNOSIS — Z85820 Personal history of malignant melanoma of skin: Secondary | ICD-10-CM

## 2019-03-05 DIAGNOSIS — Z6830 Body mass index (BMI) 30.0-30.9, adult: Secondary | ICD-10-CM

## 2019-03-05 HISTORY — DX: COVID-19: U07.1

## 2019-03-05 HISTORY — DX: Unspecified acute lower respiratory infection: J22

## 2019-03-05 LAB — URINALYSIS, COMPLETE (UACMP) WITH MICROSCOPIC
Bilirubin Urine: NEGATIVE
Glucose, UA: NEGATIVE mg/dL
Hgb urine dipstick: NEGATIVE
Ketones, ur: 5 mg/dL — AB
Leukocytes,Ua: NEGATIVE
Nitrite: NEGATIVE
Protein, ur: NEGATIVE mg/dL
Specific Gravity, Urine: 1.016 (ref 1.005–1.030)
pH: 6 (ref 5.0–8.0)

## 2019-03-05 LAB — CBC WITH DIFFERENTIAL/PLATELET
Abs Immature Granulocytes: 0.02 10*3/uL (ref 0.00–0.07)
Basophils Absolute: 0 10*3/uL (ref 0.0–0.1)
Basophils Relative: 0 %
Eosinophils Absolute: 0 10*3/uL (ref 0.0–0.5)
Eosinophils Relative: 0 %
HCT: 39.4 % (ref 36.0–46.0)
Hemoglobin: 13.1 g/dL (ref 12.0–15.0)
Immature Granulocytes: 0 %
Lymphocytes Relative: 14 %
Lymphs Abs: 0.8 10*3/uL (ref 0.7–4.0)
MCH: 26.6 pg (ref 26.0–34.0)
MCHC: 33.2 g/dL (ref 30.0–36.0)
MCV: 80.1 fL (ref 80.0–100.0)
Monocytes Absolute: 0.5 10*3/uL (ref 0.1–1.0)
Monocytes Relative: 8 %
Neutro Abs: 4.3 10*3/uL (ref 1.7–7.7)
Neutrophils Relative %: 78 %
Platelets: 184 10*3/uL (ref 150–400)
RBC: 4.92 MIL/uL (ref 3.87–5.11)
RDW: 13.2 % (ref 11.5–15.5)
WBC: 5.6 10*3/uL (ref 4.0–10.5)
nRBC: 0 % (ref 0.0–0.2)

## 2019-03-05 LAB — COMPREHENSIVE METABOLIC PANEL
ALT: 83 U/L — ABNORMAL HIGH (ref 0–44)
AST: 80 U/L — ABNORMAL HIGH (ref 15–41)
Albumin: 3.5 g/dL (ref 3.5–5.0)
Alkaline Phosphatase: 94 U/L (ref 38–126)
Anion gap: 10 (ref 5–15)
BUN: 12 mg/dL (ref 6–20)
CO2: 26 mmol/L (ref 22–32)
Calcium: 8.9 mg/dL (ref 8.9–10.3)
Chloride: 102 mmol/L (ref 98–111)
Creatinine, Ser: 0.77 mg/dL (ref 0.44–1.00)
GFR calc Af Amer: >60 mL/min (ref >60)
GFR calc non Af Amer: >60 mL/min (ref >60)
Glucose, Bld: 115 mg/dL — ABNORMAL HIGH (ref 70–99)
Potassium: 3.6 mmol/L (ref 3.5–5.1)
Sodium: 138 mmol/L (ref 135–145)
Total Bilirubin: 0.9 mg/dL (ref 0.3–1.2)
Total Protein: 6.9 g/dL (ref 6.5–8.1)

## 2019-03-05 LAB — FERRITIN: Ferritin: 435 ng/mL — ABNORMAL HIGH (ref 11–307)

## 2019-03-05 LAB — ABO/RH: ABO/RH(D): A NEG

## 2019-03-05 LAB — SAMPLE TO BLOOD BANK

## 2019-03-05 LAB — SARS CORONAVIRUS 2 BY RT PCR (HOSPITAL ORDER, PERFORMED IN ~~LOC~~ HOSPITAL LAB): SARS Coronavirus 2: POSITIVE — AB

## 2019-03-05 LAB — TROPONIN I (HIGH SENSITIVITY): Troponin I (High Sensitivity): 5 ng/L (ref <18)

## 2019-03-05 LAB — LIPASE, BLOOD: Lipase: 22 U/L (ref 11–51)

## 2019-03-05 LAB — PROCALCITONIN: Procalcitonin: <0.1 ng/mL

## 2019-03-05 LAB — D-DIMER, QUANTITATIVE: D-Dimer, Quant: 1.01 ug/mL-FEU — ABNORMAL HIGH (ref 0.00–0.50)

## 2019-03-05 LAB — C-REACTIVE PROTEIN: CRP: 8.6 mg/dL — ABNORMAL HIGH (ref <1.0)

## 2019-03-05 MED ORDER — LIDOCAINE VISCOUS HCL 2 % MT SOLN
15.0000 mL | Freq: Once | OROMUCOSAL | Status: AC
  Administered 2019-03-05: 15 mL via ORAL
  Filled 2019-03-05: qty 15

## 2019-03-05 MED ORDER — FAMOTIDINE IN NACL 20-0.9 MG/50ML-% IV SOLN
20.0000 mg | Freq: Once | INTRAVENOUS | Status: AC
  Administered 2019-03-05: 20 mg via INTRAVENOUS
  Filled 2019-03-05: qty 50

## 2019-03-05 MED ORDER — DEXAMETHASONE SODIUM PHOSPHATE 10 MG/ML IJ SOLN
6.0000 mg | INTRAMUSCULAR | Status: DC
  Administered 2019-03-05 – 2019-03-08 (×4): 6 mg via INTRAVENOUS
  Filled 2019-03-05 (×4): qty 1

## 2019-03-05 MED ORDER — MECLIZINE HCL 25 MG PO TABS
25.0000 mg | ORAL_TABLET | Freq: Three times a day (TID) | ORAL | Status: DC | PRN
  Filled 2019-03-05: qty 1

## 2019-03-05 MED ORDER — BENZONATATE 100 MG PO CAPS
200.0000 mg | ORAL_CAPSULE | Freq: Three times a day (TID) | ORAL | Status: DC
  Administered 2019-03-05 – 2019-03-09 (×11): 200 mg via ORAL
  Filled 2019-03-05 (×11): qty 2

## 2019-03-05 MED ORDER — ACETAMINOPHEN 500 MG PO TABS
1000.0000 mg | ORAL_TABLET | Freq: Once | ORAL | Status: AC
  Administered 2019-03-05: 1000 mg via ORAL
  Filled 2019-03-05: qty 2

## 2019-03-05 MED ORDER — PROMETHAZINE HCL 25 MG/ML IJ SOLN
12.5000 mg | Freq: Four times a day (QID) | INTRAMUSCULAR | Status: DC | PRN
  Administered 2019-03-05 – 2019-03-06 (×3): 25 mg via INTRAVENOUS
  Filled 2019-03-05 (×3): qty 1

## 2019-03-05 MED ORDER — ACETAMINOPHEN 325 MG PO TABS: 650.0000 mg | ORAL_TABLET | Freq: Four times a day (QID) | ORAL | Status: DC | PRN

## 2019-03-05 MED ORDER — SODIUM CHLORIDE 0.9 % IV SOLN
3.0000 g | Freq: Four times a day (QID) | INTRAVENOUS | Status: DC
  Administered 2019-03-05: 3 g via INTRAVENOUS
  Filled 2019-03-05: qty 8

## 2019-03-05 MED ORDER — SODIUM CHLORIDE 0.9 % IV SOLN
INTRAVENOUS | Status: DC
  Administered 2019-03-05 – 2019-03-06 (×2): via INTRAVENOUS

## 2019-03-05 MED ORDER — ENOXAPARIN SODIUM 40 MG/0.4ML ~~LOC~~ SOLN
40.0000 mg | SUBCUTANEOUS | Status: DC
  Administered 2019-03-05: 40 mg via SUBCUTANEOUS
  Filled 2019-03-05: qty 0.4

## 2019-03-05 MED ORDER — SODIUM CHLORIDE 0.9 % IV SOLN
100.0000 mg | INTRAVENOUS | Status: DC
  Administered 2019-03-06 – 2019-03-08 (×3): 100 mg via INTRAVENOUS
  Filled 2019-03-05 (×4): qty 20

## 2019-03-05 MED ORDER — ALUM & MAG HYDROXIDE-SIMETH 200-200-20 MG/5ML PO SUSP
30.0000 mL | Freq: Once | ORAL | Status: AC
  Administered 2019-03-05: 30 mL via ORAL
  Filled 2019-03-05: qty 30

## 2019-03-05 MED ORDER — SODIUM CHLORIDE 0.9 % IV SOLN
200.0000 mg | Freq: Once | INTRAVENOUS | Status: AC
  Administered 2019-03-05: 200 mg via INTRAVENOUS
  Filled 2019-03-05: qty 40

## 2019-03-05 MED ORDER — ONDANSETRON HCL 4 MG/2ML IJ SOLN
4.0000 mg | Freq: Once | INTRAMUSCULAR | Status: AC
  Administered 2019-03-05: 4 mg via INTRAVENOUS
  Filled 2019-03-05: qty 2

## 2019-03-05 MED ORDER — INFLUENZA VAC SPLIT QUAD 0.5 ML IM SUSY: 0.5000 mL | PREFILLED_SYRINGE | INTRAMUSCULAR | Status: DC | PRN

## 2019-03-05 MED ORDER — PANTOPRAZOLE SODIUM 40 MG IV SOLR
40.0000 mg | Freq: Once | INTRAVENOUS | Status: AC
  Administered 2019-03-05: 09:00:00 40 mg via INTRAVENOUS
  Filled 2019-03-05: qty 40

## 2019-03-05 MED ORDER — ZINC SULFATE 220 (50 ZN) MG PO CAPS
220.0000 mg | ORAL_CAPSULE | Freq: Every day | ORAL | Status: DC
  Administered 2019-03-05 – 2019-03-09 (×5): 220 mg via ORAL
  Filled 2019-03-05 (×5): qty 1

## 2019-03-05 MED ORDER — VITAMIN C 500 MG PO TABS
500.0000 mg | ORAL_TABLET | Freq: Every day | ORAL | Status: DC
  Administered 2019-03-05 – 2019-03-09 (×5): 500 mg via ORAL
  Filled 2019-03-05 (×5): qty 1

## 2019-03-05 MED ORDER — SODIUM CHLORIDE 0.9 % IV BOLUS
1000.0000 mL | Freq: Once | INTRAVENOUS | Status: AC
  Administered 2019-03-05: 1000 mL via INTRAVENOUS

## 2019-03-05 MED ORDER — VITAMIN D 25 MCG (1000 UNIT) PO TABS
1000.0000 [IU] | ORAL_TABLET | Freq: Every day | ORAL | Status: DC
  Administered 2019-03-05 – 2019-03-09 (×5): 1000 [IU] via ORAL
  Filled 2019-03-05 (×8): qty 1

## 2019-03-05 MED ORDER — LOSARTAN POTASSIUM 25 MG PO TABS
50.0000 mg | ORAL_TABLET | Freq: Every day | ORAL | Status: DC
  Administered 2019-03-05 – 2019-03-09 (×5): 50 mg via ORAL
  Filled 2019-03-05 (×5): qty 2

## 2019-03-05 MED ORDER — ONDANSETRON HCL 4 MG/2ML IJ SOLN
4.0000 mg | Freq: Four times a day (QID) | INTRAMUSCULAR | Status: DC | PRN
  Administered 2019-03-05 – 2019-03-06 (×3): 4 mg via INTRAVENOUS
  Filled 2019-03-05 (×3): qty 2

## 2019-03-05 MED ORDER — ONDANSETRON 4 MG PO TBDP
4.0000 mg | ORAL_TABLET | Freq: Once | ORAL | Status: AC
  Administered 2019-03-05: 4 mg via ORAL
  Filled 2019-03-05: qty 1

## 2019-03-05 MED ORDER — METOPROLOL SUCCINATE ER 25 MG PO TB24
25.0000 mg | ORAL_TABLET | Freq: Every day | ORAL | Status: DC
  Administered 2019-03-06 – 2019-03-08 (×3): 25 mg via ORAL
  Filled 2019-03-05 (×3): qty 1

## 2019-03-05 MED ORDER — HYDROCOD POLST-CPM POLST ER 10-8 MG/5ML PO SUER: 5.0000 mL | Freq: Two times a day (BID) | ORAL | Status: DC | PRN

## 2019-03-05 MED ORDER — ONDANSETRON HCL 4 MG PO TABS: 4.0000 mg | ORAL_TABLET | Freq: Four times a day (QID) | ORAL | Status: DC | PRN

## 2019-03-05 NOTE — ED Notes (Signed)
Report to cory with [facility]

## 2019-03-05 NOTE — ED Notes (Signed)
Phlebotomist at bedside collecting blood cultures now.

## 2019-03-05 NOTE — ED Notes (Signed)
MD at bedside to update pt on positive COVID

## 2019-03-05 NOTE — H&P (Signed)
HISTORY AND PHYSICAL       PATIENT DETAILS Name: Kathryn Torres Age: 58 y.o. Sex: female Date of Birth: 1961-04-19 Admit Date: 03/05/2019 [id number], Aris Everts, MD   Patient coming from: [facility] ED   CHIEF COMPLAINT:  Intractable nausea, vomiting and diarrhea for past 5 days  HPI: Kathryn Torres is a 58 y.o. female with medical history significant of HTN, HELP syndrome presented to the hospital for evaluation of the above-noted complaints.  Per patient, approximately 5 days back-she started having nausea, vomiting and diarrhea.  Approximately 2 days back she went to the emergency room after she sustained a mechanical fall.  CT of the head then was negative.  Patient was subsequently discharged home, upon going home-her nausea, vomiting and diarrhea have persisted.  Diarrhea is described as frequent loose watery stools without any blood or mucus.  She has had around 4-6 episodes of vomiting a day.  Because of persistent symptoms, she presented to the emergency room, where further evaluation revealed that she in fact had COVID-19.  A CT of the abdomen was done-lung cuts were positive for pneumonia.  She was subsequently transferred to [facility] for further evaluation and treatment.  Patient does acknowledge subjective fever with chills.  She has no specific myalgias however.  Patient does acknowledge cough-but does not have any shortness of breath.   ED Course: Chest x-ray with no pneumonia, however CT of the abdomen showed pneumonia.  COVID-19 was positive.  Patient was given symptomatic care and transferred to [facility]  Note: Lives at: Home Mobility: Independent Chronic Indwelling Foley:no   REVIEW OF SYSTEMS:  Constitutional:   No  weight loss, night sweats,   fatigue.  HEENT:    No headaches, Dysphagia,Tooth/dental problems,Sore throat,  No sneezing, itching, ear ache, nasal congestion, post nasal drip  Cardio-vascular: No chest pain,Orthopnea, PND,lower  extremity edema, anasarca, palpitations  GI:  No heartburn, indigestion,   Resp: No shortness of breath,  hemoptysis,plueritic chest pain.   Skin:  No rash or lesions.  GU:  No dysuria, change in color of urine, no urgency or frequency.  No flank pain.  Musculoskeletal: No joint pain or swelling.  No decreased range of motion.  No back pain.  Endocrine: No heat intolerance, no cold intolerance, no polyuria, no polydipsia  Psych: No change in mood or affect. No depression or anxiety.  No memory loss.   ALLERGIES:   Allergies  Allergen Reactions  . Contrast Media [Iodinated Diagnostic Agents]     PAST MEDICAL HISTORY: Past Medical History:  Diagnosis Date  . Hypertension   . melanoma 2006   lower back  . Previous pregnancy with hemolysis, elevated liver enzymes, and low platelet (HELLP) syndrome, antepartum 2002   s/p c section     PAST SURGICAL HISTORY: Past Surgical History:  Procedure Laterality Date  . BREAST BIOPSY Right 1998 and 2005   benign  . CESAREAN SECTION  2002    MEDICATIONS AT HOME: Prior to Admission medications   Medication Sig Start Date End Date Taking? Authorizing Provider  cholecalciferol (VITAMIN D) 1000 units tablet Take 1,000 Units by mouth daily.    [provider]  ergocalciferol (DRISDOL) 50000 units capsule Take 1 capsule (50,000 Units total) by mouth once a week. Patient not taking: Reported on 03/05/2019 02/26/18   Crecencio Mc, MD  losartan (COZAAR) 50 MG tablet TAKE 1 TABLET BY MOUTH DAILY. Patient taking differently: Take 50 mg by mouth  daily.  09/16/18   Crecencio Mc, MD  meclizine (ANTIVERT) 25 MG tablet Take 1 tablet (25 mg total) by mouth 3 (three) times daily as needed for dizziness. 03/03/19   Blake Divine, MD  metoprolol succinate (TOPROL-XL) 25 MG 24 hr tablet TAKE 1 TABLET (25 MG TOTAL) BY MOUTH DAILY. Patient taking differently: Take 25 mg by mouth at bedtime.  09/16/18   Crecencio Mc, MD  promethazine  (PHENERGAN) 12.5 MG tablet Take 1 tablet (12.5 mg total) by mouth every 6 (six) hours as needed for nausea or vomiting. 03/03/19   Blake Divine, MD    FAMILY HISTORY: Family History  Problem Relation Age of Onset  . Cancer Mother        Breast and Lung Ca, social smoker   . Hypertension Mother   . Breast cancer Mother 46  . Cancer Father        Lung Ca, smoker  . Stroke Paternal Grandfather 58       CVA ,    . Deep vein thrombosis Maternal Grandmother      SOCIAL HISTORY:  reports that she has never smoked. She has never used smokeless tobacco. She reports that she does not drink alcohol or use drugs.  PHYSICAL EXAM: Blood pressure 120/72, pulse 93, resp. rate 18, SpO2 95 %.  General appearance :Awake, alert, not in any distress.  Eyes:, pupils equally reactive to light and accomodation,no scleral icterus.Pink conjunctiva HEENT: Atraumatic and Normocephalic Neck: supple, no JVD.  Resp:Good air entry bilaterally, no added sounds  CVS: S1 S2 regular, no murmurs.  GI: Bowel sounds present, Non tender and not distended with no gaurding, rigidity or rebound. Extremities: B/L Lower Ext shows no edema, both legs are warm to touch Neurology:  speech clear,Non focal, sensation is grossly intact. Psychiatric: Normal judgment and insight. Alert and oriented x 3. Normal mood. Musculoskeletal:gait appears to be normal.No digital cyanosis Skin:No Rash, warm and dry Wounds:N/A  LABS ON ADMISSION:  I have personally reviewed following labs and imaging studies  CBC: Recent Labs  Lab 03/03/19 1533 03/05/19 0655  WBC 7.8 5.6  NEUTROABS  --  4.3  HGB 14.7 13.1  HCT 43.7 39.4  MCV 78.7* 80.1  PLT 205 [id number]    Basic Metabolic Panel: Recent Labs  Lab 03/03/19 1533 03/05/19 0655  NA 139 138  K 3.6 3.6  CL 102 102  CO2 22 26  GLUCOSE 153* 115*  BUN 13 12  CREATININE 0.88 0.77  CALCIUM 9.6 8.9    GFR: Estimated Creatinine Clearance: 83.5 mL/min (by C-G formula based on SCr  of 0.77 mg/dL).  Liver Function Tests: Recent Labs  Lab 03/03/19 1533 03/05/19 0655  AST 56* 80*  ALT 48* 83*  ALKPHOS 97 94  BILITOT 0.8 0.9  PROT 8.0 6.9  ALBUMIN 4.0 3.5   Recent Labs  Lab 03/03/19 1533 03/05/19 0655  LIPASE 20 22   No results for input(s): AMMONIA in the last 168 hours.  Coagulation Profile: No results for input(s): INR, PROTIME in the last 168 hours.  Cardiac Enzymes: No results for input(s): CKTOTAL, CKMB, CKMBINDEX, TROPONINI in the last 168 hours.  BNP (last 3 results) No results for input(s): PROBNP in the last 8760 hours.  HbA1C: No results for input(s): HGBA1C in the last 72 hours.  CBG: No results for input(s): GLUCAP in the last 168 hours.  Lipid Profile: No results for input(s): CHOL, HDL, LDLCALC, TRIG, CHOLHDL, LDLDIRECT in the last 72 hours.  Thyroid Function Tests: No results for input(s): TSH, T4TOTAL, FREET4, T3FREE, THYROIDAB in the last 72 hours.  Anemia Panel: No results for input(s): VITAMINB12, FOLATE, FERRITIN, TIBC, IRON, RETICCTPCT in the last 72 hours.  Urine analysis:    Component Value Date/Time   COLORURINE YELLOW (A) 03/05/2019 1113   APPEARANCEUR CLEAR (A) 03/05/2019 1113   LABSPEC 1.016 03/05/2019 1113   PHURINE 6.0 03/05/2019 1113   GLUCOSEU NEGATIVE 03/05/2019 1113   GLUCOSEU NEGATIVE 06/22/2014 0902   HGBUR NEGATIVE 03/05/2019 1113   BILIRUBINUR NEGATIVE 03/05/2019 1113   KETONESUR 5 (A) 03/05/2019 1113   PROTEINUR NEGATIVE 03/05/2019 1113   UROBILINOGEN 0.2 06/22/2014 0902   NITRITE NEGATIVE 03/05/2019 1113   LEUKOCYTESUR NEGATIVE 03/05/2019 1113    Sepsis Labs: Lactic Acid, Venous No results found for: [place]   Microbiology: Recent Results (from the past 240 hour(s))  SARS Coronavirus 2 [facility] order, Performed in [place] hospital lab) Nasopharyngeal Nasopharyngeal Swab     Status: Abnormal   Collection Time: 03/05/19 10:26 AM   Specimen: Nasopharyngeal Swab  Result Value Ref  Range Status   SARS Coronavirus 2 POSITIVE (A) NEGATIVE Final    Comment: RESULT CALLED TO, READ BACK BY AND VERIFIED WITH: BRIANNA CHATMON AT [id number] 03/05/2019 SDR (NOTE) If result is NEGATIVE SARS-CoV-2 target nucleic acids are NOT DETECTED. The SARS-CoV-2 RNA is generally detectable in upper and lower  respiratory specimens during the acute phase of infection. The lowest  concentration of SARS-CoV-2 viral copies this assay can detect is 250  copies / mL. A negative result does not preclude SARS-CoV-2 infection  and should not be used as the sole basis for treatment or other  patient management decisions.  A negative result may occur with  improper specimen collection / handling, submission of specimen other  than nasopharyngeal swab, presence of viral mutation(s) within the  areas targeted by this assay, and inadequate number of viral copies  (<250 copies / mL). A negative result must be combined with clinical  observations, patient history, and epidemiological information. If result is POSITIVE SARS-CoV-2 target nucleic acids are DETECTED.  The SARS-CoV-2 RNA is generally detectable in upper and lower  respiratory specimens during the acute phase of infection.  Positive  results are indicative of active infection with SARS-CoV-2.  Clinical  correlation with patient history and other diagnostic information is  necessary to determine patient infection status.  Positive results do  not rule out bacterial infection or co-infection with other viruses. If result is PRESUMPTIVE POSTIVE SARS-CoV-2 nucleic acids MAY BE PRESENT.   A presumptive positive result was obtained on the submitted specimen  and confirmed on repeat testing.  While 2019 novel coronavirus  (SARS-CoV-2) nucleic acids may be present in the submitted sample  additional confirmatory testing may be necessary for epidemiological  and / or clinical management purposes  to differentiate between  SARS-CoV-2 and other  Sarbecovirus currently known to infect humans.  If clinically indicated additional testing with an alternate test  methodology [phone])  is advised. The SARS-CoV-2 RNA is generally  detectable in upper and lower respiratory specimens during the acute  phase of infection. The expected result is Negative. Fact Sheet for Patients:  StrictlyIdeas.no Fact Sheet for Healthcare Providers: BankingDealers.co.za This test is not yet approved or cleared by the [place] FDA and has been authorized for detection and/or diagnosis of SARS-CoV-2 by FDA under an Emergency Use Authorization (EUA).  This EUA will remain in effect (meaning this test can be used) for the  duration of the COVID-19 declaration under Section 564(b)(1) of the Act, 21 U.S.C. section 360bbb-3(b)(1), unless the authorization is terminated or revoked sooner. Performed at [facility], [address] [place], [place] [address]       RADIOLOGIC STUDIES ON ADMISSION: Ct Abdomen Pelvis Wo Contrast  Result Date: 03/05/2019 CLINICAL DATA:  Abdominal distention with nausea and vomiting EXAM: CT ABDOMEN AND PELVIS WITHOUT CONTRAST TECHNIQUE: Multidetector CT imaging of the abdomen and pelvis was performed following the standard protocol without oral or IV contrast. COMPARISON:  July 22, 2008 FINDINGS: Lower chest: There is bibasilar atelectasis. There is apparent consolidation in a portion of the posterior segment right lower lobe. Hepatobiliary: A small portion of the dome of the liver is not imaged on this study. No focal liver lesions are evident on this noncontrast enhanced study. There is cholelithiasis. Gallbladder wall does not appear appreciably thickened by CT. There is no evident biliary duct dilatation. Pancreas: There is no pancreatic mass or inflammatory focus. Spleen: No splenic lesions are evident. Adrenals/Urinary Tract: Adrenals bilaterally appear normal. There  are apparent parapelvic cysts within the left kidney, largest measuring 2.1 x 2.1 cm. There is no appreciable hydronephrosis on either side. There is no evident renal or ureteral calculus on either side. Note that there are phleboliths in the pelvis which are near but separate from the distal left ureter, also present on previous study. Urinary bladder is midline with wall thickness within normal limits. Stomach/Bowel: There is no appreciable bowel wall or mesenteric thickening. Colon is largely collapsed. Terminal ileum appears normal. There is lipomatous infiltration of the ileocecal valve. No evident bowel obstruction. No free air or portal venous air. Vascular/Lymphatic: There is no abdominal aortic aneurysm. No vascular lesions are evident. There is no adenopathy in the abdomen or pelvis by size criteria. There are scattered subcentimeter mesenteric lymph nodes, considered nonspecific. Reproductive: Uterus is in the midline. No pelvic masses are evident. Other: Appendix is diminutive. There is no periappendiceal region inflammation. No abscess or ascites evident in the abdomen or pelvis. Musculoskeletal: There are foci of degenerative change in the lumbar spine. There are no blastic or lytic bone lesions. No intramuscular or abdominal wall lesion evident. IMPRESSION: 1. Area of apparent pneumonia in the posterior aspect of the right lower lobe, incompletely visualized. There is also patchy atelectatic change in both lung bases posteriorly. 2.  Cholelithiasis. 3. No evident bowel obstruction. No abscess in the abdomen or pelvis. No periappendiceal region inflammatory change. 4. No evident renal or ureteral calculus. No hydronephrosis. Urinary bladder wall thickness within normal limits. 5. Subcentimeter mesenteric lymph nodes are considered nonspecific. In the appropriate clinical setting, a degree of mesenteric adenitis could present in this manner. Electronically Signed   By: Lowella Grip III M.D.   On:  03/05/2019 09:11   Ct Head Wo Contrast  Result Date: 03/03/2019 CLINICAL DATA:  58 year old female with head trauma. EXAM: CT HEAD WITHOUT CONTRAST TECHNIQUE: Contiguous axial images were obtained from the base of the skull through the vertex without intravenous contrast. COMPARISON:  None. FINDINGS: Brain: The ventricular and sulci appropriate size for patient's age. The gray-white matter discrimination is preserved. There is no acute intracranial hemorrhage. No mass effect or midline shift. No extra-axial fluid collection. Vascular: No hyperdense vessel or unexpected calcification. Skull: Normal. Negative for fracture or focal lesion. Sinuses/Orbits: No acute finding. Other: None IMPRESSION: No acute intracranial pathology. Electronically Signed   By: Anner Crete M.D.   On: 03/03/2019 17:57  Dg Chest Portable 1 View  Result Date: 03/05/2019 CLINICAL DATA:  Shortness of breath. Nausea, vomiting, and diarrhea. EXAM: PORTABLE CHEST 1 VIEW COMPARISON:  None. FINDINGS: The heart size and pulmonary vascularity are normal. There are small areas of atelectasis in the left lower lung zone. Slight peribronchial thickening at the right lung base. Minimal atelectasis in the right midzone. No effusions. No bone abnormality. IMPRESSION: Bronchitic changes with small areas of atelectasis bilaterally. Electronically Signed   By: Lorriane Shire M.D.   On: 03/05/2019 12:43    I have personally reviewed images of chest xray-no obvious pneumonia  EKG:  Personally reviewed.  Sinus tachycardia  ASSESSMENT AND PLAN: Mild hypoxia secondary to COVID-19 pneumonia: O2 saturation in the low 90s on room air-she has cough, CT evidence of pneumonia-starting Remdesivir and Decadron.  Awaiting inflammatory markers.  Gastroenteritis: Likely secondary to COVID-19-managing with supportive care.  Start clear liquids and advance as tolerated.  Transaminitis: Likely secondary to COVID-19-watch closely while on Remdesivir   Hypertension: Continue with losartan and metoprolol.  Follow and adjust  Obesity  Above noted plan was discussed with patient face to face at bedside,she was in agreement.  I offered to call family-but she will update her family herself.  CONSULTS: None  DVT Prophylaxis: Prophylactic Lovenox   Code Status: Full Code  Disposition Plan: Home when clinically improved  Admission status:  Inpatient  going to tele  The medical decision making on this patient was of high complexity and the patient is at high risk for clinical deterioration, therefore this is a level 3 visit.   Total time spent  55 minutes.Greater than 50% of this time was spent in counseling, explanation of diagnosis, planning of further management, and coordination of care.  Severity of illness:  The appropriate patient status for this patient is INPATIENT. Inpatient status is judged to be reasonable and necessary in order to provide the required intensity of service to ensure the patient's safety. The patient's presenting symptoms, physical exam findings, and initial radiographic and laboratory data in the context of their chronic comorbidities is felt to place them at high risk for further clinical deterioration. Furthermore, it is not anticipated that the patient will be medically stable for discharge from the hospital within 2 midnights of admission. The following factors support the patient status of inpatient.   " The patient's presenting symptoms include Cough, Nausea/Vomiting and diarrhea " The worrisome physical exam findings include dehydration " The initial radiographic and laboratory data are worrisome because of CT chest + PNA, Transaminitis " The chronic co-morbidities include HTN, Obesity   * I certify that at the point of admission it is my clinical judgment that the patient will require inpatient hospital care spanning beyond 2 midnights from the point of admission due to high intensity of service, high  risk for further deterioration and high frequency of surveillance required.  Oren Binet [facility] Pager [phone]  If 7PM-7AM, please contact night-coverage  Please page via [email or web address]  Go to [email or web address] and use 's universal password to access. If you do not have the password, please contact the hospital operator.  Locate the [facility] provider you are looking for under [facility] and page to a number that you can be directly reached. If you still have difficulty reaching the provider, please page the [facility] (Director on Call) for the Hospitalists listed on amion for assistance.  03/05/2019, 3:39 PM

## 2019-03-05 NOTE — ED Notes (Signed)
Pt alert and oriented on leaving.  Pt discharged with [facility] to [facility].

## 2019-03-05 NOTE — ED Notes (Signed)
Pt ambulated to toilet, NAD noted.

## 2019-03-05 NOTE — Progress Notes (Signed)
Pharmacy Note - Remdesivir Dosing  O:  ALT: 83 CXR: Bronchitic changes with small areas of atelectasis bilaterally. O2 sats: 92% on RA   A/P:  Patient meets criteria for remdesivir.  Begin remdesivir 200 mg IV x 1, followed by 100 mg IV daily x 4 days  Monitor ALT, clinical progress  Peggyann Juba, PharmD, [place] [phone] 03/05/2019 3:39 PM

## 2019-03-05 NOTE — ED Notes (Signed)
Lab called and informed RN did not have enough blood to run labs that were added on.  This RN called [facility] so that labs can be done there.

## 2019-03-05 NOTE — Progress Notes (Signed)
02 sats 91% on room air, 2 L [place] 02 started, MD aware

## 2019-03-05 NOTE — ED Notes (Signed)
Patient transported to CT 

## 2019-03-05 NOTE — Progress Notes (Signed)
Patient's COVID-19 test came back positive.  She will be transferred to [facility] by the ED physician

## 2019-03-05 NOTE — ED Notes (Signed)
Pt tachypneic but unlabored, in NAD. Offered meal but declined, water and sprite given. Updated on plan to transfer to [facility]. Pt stated she has been updating her family.  Alert and oriented X4.

## 2019-03-05 NOTE — ED Notes (Signed)
Lab contacted to send phlebotomist for blood draw 

## 2019-03-05 NOTE — H&P (Signed)
[facility] NAME: Kathryn Torres    MR#:  [id number]  DATE OF BIRTH:  July 10, 1960  DATE OF ADMISSION:  03/05/2019  PRIMARY CARE PHYSICIAN: Crecencio Mc, MD   REQUESTING/REFERRING PHYSICIAN: Dr. Duffy Bruce  CHIEF COMPLAINT:   Chief Complaint  Patient presents with   Dizziness   Emesis    HISTORY OF PRESENT ILLNESS:  Kathryn Torres  is a 58 y.o. female with a known history of hypertension, melanoma is independent at baseline presents to hospital secondary to nausea vomiting and diarrhea for 5 days now. Patient is independent at baseline, has been doing well up until 5 days ago.  Sunday morning she woke up with some nausea.  Since then she has been having intractable nausea and vomiting, unable to keep anything down.  For the last 2 to 3 days she also been having burning mid abdominal pain.  Denies any prior GERD symptoms.  No history of NSAID usage.  Also has been having 2-3 loose stools every day.  Had low-grade fevers and chills on and off for the last 3 days.  Denies any exposure to sick contacts, no previous cover test done.  She was in the ER 2 days ago with similar symptoms and was discharged on meclizine and Phenergan for vertigo and nausea symptoms.  However symptoms did not improve and have actually gotten worse. Chest x-ray here shows right basilar infiltrate concerning for possible aspiration.  Labs are otherwise normal.  PAST MEDICAL HISTORY:   Past Medical History:  Diagnosis Date   Hypertension    melanoma 2006   lower back   Previous pregnancy with hemolysis, elevated liver enzymes, and low platelet (HELLP) syndrome, antepartum 2002   s/p c section     PAST SURGICAL HISTORY:   Past Surgical History:  Procedure Laterality Date   BREAST BIOPSY Right 1998 and 2005   benign   CESAREAN SECTION  2002    SOCIAL HISTORY:   Social History   Tobacco Use   Smoking status: Never Smoker   Smokeless  tobacco: Never Used  Substance Use Topics   Alcohol use: No    FAMILY HISTORY:   Family History  Problem Relation Age of Onset   Cancer Mother        Breast and Lung Ca, social smoker    Hypertension Mother    Breast cancer Mother 4   Cancer Father        Lung Ca, smoker   Stroke Paternal Grandfather 6       CVA ,     Deep vein thrombosis Maternal Grandmother     DRUG ALLERGIES:   Allergies  Allergen Reactions   Contrast Media [Iodinated Diagnostic Agents]     REVIEW OF SYSTEMS:   Review of Systems  Constitutional: Positive for chills and fever. Negative for malaise/fatigue and weight loss.  HENT: Negative for ear discharge, ear pain, hearing loss, nosebleeds and tinnitus.   Eyes: Negative for blurred vision, double vision and photophobia.  Respiratory: Negative for cough, hemoptysis, shortness of breath and wheezing.   Cardiovascular: Negative for chest pain, palpitations, orthopnea and leg swelling.  Gastrointestinal: Positive for abdominal pain, diarrhea, nausea and vomiting. Negative for constipation, heartburn and melena.  Genitourinary: Negative for dysuria, frequency, hematuria and urgency.  Musculoskeletal: Negative for back pain, myalgias and neck pain.  Skin: Negative for rash.  Neurological: Negative for dizziness, tingling, tremors, sensory change, speech change, focal weakness and headaches.  Endo/Heme/Allergies: Does not bruise/bleed easily.  Psychiatric/Behavioral: Negative for depression.    MEDICATIONS AT HOME:   Prior to Admission medications   Medication Sig Start Date End Date Taking? Authorizing Provider  cholecalciferol (VITAMIN D) 1000 units tablet Take 1,000 Units by mouth daily.   Yes [provider]  losartan (COZAAR) 50 MG tablet TAKE 1 TABLET BY MOUTH DAILY. Patient taking differently: Take 50 mg by mouth daily.  09/16/18  Yes Crecencio Mc, MD  meclizine (ANTIVERT) 25 MG tablet Take 1 tablet (25 mg total) by mouth 3  (three) times daily as needed for dizziness. 03/03/19  Yes Blake Divine, MD  metoprolol succinate (TOPROL-XL) 25 MG 24 hr tablet TAKE 1 TABLET (25 MG TOTAL) BY MOUTH DAILY. Patient taking differently: Take 25 mg by mouth at bedtime.  09/16/18  Yes Crecencio Mc, MD  promethazine (PHENERGAN) 12.5 MG tablet Take 1 tablet (12.5 mg total) by mouth every 6 (six) hours as needed for nausea or vomiting. 03/03/19  Yes Blake Divine, MD  ergocalciferol (DRISDOL) 50000 units capsule Take 1 capsule (50,000 Units total) by mouth once a week. Patient not taking: Reported on 03/05/2019 02/26/18   Crecencio Mc, MD      VITAL SIGNS:  Blood pressure 133/69, pulse 100, temperature 99.2 F (37.3 C), temperature source Oral, resp. rate 18, height 5\' 3"  (1.6 m), weight 89.8 kg, SpO2 98 %.  PHYSICAL EXAMINATION:  Physical Exam  GENERAL:  58 y.o.-year-old obese patient lying in the bed with no acute distress.  EYES: Pupils equal, round, reactive to light and accommodation. No scleral icterus. Extraocular muscles intact.  HEENT: Head atraumatic, normocephalic. Oropharynx and nasopharynx clear.  NECK:  Supple, no jugular venous distention. No thyroid enlargement, no tenderness.  LUNGS: Normal breath sounds bilaterally, no wheezing, rales,rhonchi or crepitation. No use of accessory muscles of respiration.  Decreased bibasilar breath sounds CARDIOVASCULAR: S1, S2 normal. No murmurs, rubs, or gallops.  ABDOMEN: Soft, discomfort on palpation in the midabdomen, nondistended. Bowel sounds present. No organomegaly or mass.  EXTREMITIES: No pedal edema, cyanosis, or clubbing.  NEUROLOGIC: Cranial nerves II through XII are intact. Muscle strength 5/5 in all extremities. Sensation intact. Gait not checked.  PSYCHIATRIC: The patient is alert and oriented x 3.  SKIN: No obvious rash, lesion, or ulcer.   LABORATORY PANEL:   CBC Recent Labs  Lab 03/05/19 0655  WBC 5.6  HGB 13.1  HCT 39.4  PLT 184    ------------------------------------------------------------------------------------------------------------------  Chemistries  Recent Labs  Lab 03/05/19 0655  NA 138  K 3.6  CL 102  CO2 26  GLUCOSE 115*  BUN 12  CREATININE 0.77  CALCIUM 8.9  AST 80*  ALT 83*  ALKPHOS 94  BILITOT 0.9   ------------------------------------------------------------------------------------------------------------------  Cardiac Enzymes No results for input(s): TROPONINI in the last 168 hours. ------------------------------------------------------------------------------------------------------------------  RADIOLOGY:  Ct Abdomen Pelvis Wo Contrast  Result Date: 03/05/2019 CLINICAL DATA:  Abdominal distention with nausea and vomiting EXAM: CT ABDOMEN AND PELVIS WITHOUT CONTRAST TECHNIQUE: Multidetector CT imaging of the abdomen and pelvis was performed following the standard protocol without oral or IV contrast. COMPARISON:  July 22, 2008 FINDINGS: Lower chest: There is bibasilar atelectasis. There is apparent consolidation in a portion of the posterior segment right lower lobe. Hepatobiliary: A small portion of the dome of the liver is not imaged on this study. No focal liver lesions are evident on this noncontrast enhanced study. There is cholelithiasis. Gallbladder wall does not appear appreciably thickened by CT. There  is no evident biliary duct dilatation. Pancreas: There is no pancreatic mass or inflammatory focus. Spleen: No splenic lesions are evident. Adrenals/Urinary Tract: Adrenals bilaterally appear normal. There are apparent parapelvic cysts within the left kidney, largest measuring 2.1 x 2.1 cm. There is no appreciable hydronephrosis on either side. There is no evident renal or ureteral calculus on either side. Note that there are phleboliths in the pelvis which are near but separate from the distal left ureter, also present on previous study. Urinary bladder is midline with wall  thickness within normal limits. Stomach/Bowel: There is no appreciable bowel wall or mesenteric thickening. Colon is largely collapsed. Terminal ileum appears normal. There is lipomatous infiltration of the ileocecal valve. No evident bowel obstruction. No free air or portal venous air. Vascular/Lymphatic: There is no abdominal aortic aneurysm. No vascular lesions are evident. There is no adenopathy in the abdomen or pelvis by size criteria. There are scattered subcentimeter mesenteric lymph nodes, considered nonspecific. Reproductive: Uterus is in the midline. No pelvic masses are evident. Other: Appendix is diminutive. There is no periappendiceal region inflammation. No abscess or ascites evident in the abdomen or pelvis. Musculoskeletal: There are foci of degenerative change in the lumbar spine. There are no blastic or lytic bone lesions. No intramuscular or abdominal wall lesion evident. IMPRESSION: 1. Area of apparent pneumonia in the posterior aspect of the right lower lobe, incompletely visualized. There is also patchy atelectatic change in both lung bases posteriorly. 2.  Cholelithiasis. 3. No evident bowel obstruction. No abscess in the abdomen or pelvis. No periappendiceal region inflammatory change. 4. No evident renal or ureteral calculus. No hydronephrosis. Urinary bladder wall thickness within normal limits. 5. Subcentimeter mesenteric lymph nodes are considered nonspecific. In the appropriate clinical setting, a degree of mesenteric adenitis could present in this manner. Electronically Signed   By: Lowella Grip III M.D.   On: 03/05/2019 09:11   Ct Head Wo Contrast  Result Date: 03/03/2019 CLINICAL DATA:  58 year old female with head trauma. EXAM: CT HEAD WITHOUT CONTRAST TECHNIQUE: Contiguous axial images were obtained from the base of the skull through the vertex without intravenous contrast. COMPARISON:  None. FINDINGS: Brain: The ventricular and sulci appropriate size for patient's age. The  gray-white matter discrimination is preserved. There is no acute intracranial hemorrhage. No mass effect or midline shift. No extra-axial fluid collection. Vascular: No hyperdense vessel or unexpected calcification. Skull: Normal. Negative for fracture or focal lesion. Sinuses/Orbits: No acute finding. Other: None IMPRESSION: No acute intracranial pathology. Electronically Signed   By: Anner Crete M.D.   On: 03/03/2019 17:57      IMPRESSION AND PLAN:   Kathryn Torres  is a 58 y.o. female with a known history of hypertension, melanoma is independent at baseline presents to hospital secondary to nausea vomiting and diarrhea for 5 days now.  1.  Acute gastroenteritis-looks like viral gastroenteritis -No further diarrhea here so stool studies not done. -IV fluids, conservative management -COVID test is pending -If no improvement, consider upper GI endoscopy.  2.  Hypertension-on Toprol and losartan  3.  Aspiration pneumonitis-no hypoxia, normal white count.  Likely pneumonitis from aspiration from explosive vomiting's in the last couple of days.  Continue to monitor at this time.  Received Unasyn in the ED  4.  DVT prophylaxis-Lovenox    All the records are reviewed and case discussed with ED provider. Management plans discussed with the patient, family and they are in agreement.  CODE STATUS: Full Code  TOTAL TIME TAKING CARE  OF THIS PATIENT: 52 minutes.    Gladstone Lighter M.D on 03/05/2019 at 11:49 AM  Between 7am to 6pm - Pager - [phone]  After 6pm go to [email or web address] - Proofreader  [facility]  Office  [phone]  CC: Primary care physician; Crecencio Mc, MD   Note: This dictation was prepared with Dragon dictation along with smaller phrase technology. Any transcriptional errors that result from this process are unintentional.

## 2019-03-05 NOTE — ED Triage Notes (Signed)
Pt to triage via w/c with no distress noted, mask in place; pt reports here 2 days but symptoms persist--N/V/D and dizziness

## 2019-03-05 NOTE — ED Provider Notes (Addendum)
[facility] EMERGENCY DEPARTMENT Provider Note   CSN: [id number] Arrival date & time: 03/05/19  [id number]     History   Chief Complaint Chief Complaint  Patient presents with   Dizziness   Emesis    HPI Kathryn Torres is a 58 y.o. female.     HPI   58 yo F with PMHx below here with abdominal pain. Pt reports that for the past 3 days, she's had persistent n/v/d. She reports that her sx began gradually and have persisted since onset. She was seen on 9/9, felt better and has been at home since then. She reports that she's had difficulty eating/drinking and a worsening aching, burning pain in her epigastric area. Pain is constant, worsening, worse w/ any attempt at eating. She's also had watery, non bloody diarrhea. Her emesis has been yellow, no blood. No other complaints. No known fevers. No urinary sx. No vaginal bleeding or discharge. No alleviating factors.  Past Medical History:  Diagnosis Date   Hypertension    melanoma 2006   lower back   Previous pregnancy with hemolysis, elevated liver enzymes, and low platelet (HELLP) syndrome, antepartum 2002   s/p c section     Patient Active Problem List   Diagnosis Date Noted   Allergic rhinitis 03/30/2017   Irregular menses 06/23/2014   Vitamin D deficiency 05/25/2013   Visit for preventive health examination 05/18/2012   Symptomatic cholelithiasis 05/18/2012   Obesity (BMI 30-39.9) 11/18/2011   Previous pregnancy with hemolysis, elevated liver enzymes, and low platelet (HELLP) syndrome, antepartum    Hypertension    History of melanoma excision     Past Surgical History:  Procedure Laterality Date   BREAST BIOPSY Right 1998 and 2005   benign   CESAREAN SECTION  2002     OB History   No obstetric history on file.      Home Medications    Prior to Admission medications   Medication Sig Start Date End Date Taking? Authorizing Provider  cholecalciferol (VITAMIN D) 1000 units  tablet Take 1,000 Units by mouth daily.    [provider]  ergocalciferol (DRISDOL) 50000 units capsule Take 1 capsule (50,000 Units total) by mouth once a week. 02/26/18   Crecencio Mc, MD  losartan (COZAAR) 50 MG tablet TAKE 1 TABLET BY MOUTH DAILY. 09/16/18   Crecencio Mc, MD  meclizine (ANTIVERT) 25 MG tablet Take 1 tablet (25 mg total) by mouth 3 (three) times daily as needed for dizziness. 03/03/19   Blake Divine, MD  metoprolol succinate (TOPROL-XL) 25 MG 24 hr tablet TAKE 1 TABLET (25 MG TOTAL) BY MOUTH DAILY. 09/16/18   Crecencio Mc, MD  predniSONE (DELTASONE) 10 MG tablet 6 tablets on Day 1 , then reduce by 1 tablet daily until gone 09/05/18   Crecencio Mc, MD  promethazine (PHENERGAN) 12.5 MG tablet Take 1 tablet (12.5 mg total) by mouth every 6 (six) hours as needed for nausea or vomiting. 03/03/19   Blake Divine, MD    Family History Family History  Problem Relation Age of Onset   Cancer Mother        Breast and Lung Ca, social smoker    Hypertension Mother    Breast cancer Mother 8   Cancer Father        Lung Ca, smoker   Stroke Paternal Grandfather 58       CVA ,     Deep vein thrombosis Maternal Grandmother  Social History Social History   Tobacco Use   Smoking status: Never Smoker   Smokeless tobacco: Never Used  Substance Use Topics   Alcohol use: No   Drug use: No     Allergies   Contrast media [iodinated diagnostic agents]   Review of Systems Review of Systems  Constitutional: Positive for fatigue. Negative for fever.  HENT: Negative for congestion and sore throat.   Eyes: Negative for visual disturbance.  Respiratory: Negative for cough and shortness of breath.   Cardiovascular: Negative for chest pain.  Gastrointestinal: Positive for abdominal pain, diarrhea, nausea and vomiting.  Genitourinary: Negative for flank pain.  Musculoskeletal: Negative for back pain and neck pain.  Skin: Negative for rash and wound.    Neurological: Positive for light-headedness (upon standing). Negative for weakness.  All other systems reviewed and are negative.    Physical Exam Updated Vital Signs BP 118/67 (BP Location: Left Arm)    Pulse (!) 118    Temp 99.2 F (37.3 C) (Oral)    Resp 18    Ht 5\' 3"  (1.6 m)    Wt 89.8 kg    SpO2 100%    BMI 35.07 kg/m   Physical Exam Vitals signs and nursing note reviewed.  Constitutional:      General: She is not in acute distress.    Appearance: She is well-developed.  HENT:     Head: Normocephalic and atraumatic.     Mouth/Throat:     Mouth: Mucous membranes are dry.  Eyes:     Conjunctiva/sclera: Conjunctivae normal.  Neck:     Musculoskeletal: Neck supple.  Cardiovascular:     Rate and Rhythm: Regular rhythm. Tachycardia present.     Heart sounds: Normal heart sounds. No murmur. No friction rub.  Pulmonary:     Effort: Pulmonary effort is normal. No respiratory distress.     Breath sounds: Normal breath sounds. No wheezing or rales.  Abdominal:     General: Bowel sounds are normal. There is no distension.     Palpations: Abdomen is soft.     Tenderness: There is abdominal tenderness in the epigastric area. There is no right CVA tenderness, left CVA tenderness, guarding or rebound. Negative signs include Murphy's sign and McBurney's sign.  Skin:    General: Skin is warm.     Capillary Refill: Capillary refill takes less than 2 seconds.  Neurological:     Mental Status: She is alert and oriented to person, place, and time.     Motor: No abnormal muscle tone.      ED Treatments / Results  Labs (all labs ordered are listed, but only abnormal results are displayed) Labs Reviewed  COMPREHENSIVE METABOLIC PANEL - Abnormal; Notable for the following components:      Result Value   Glucose, Bld 115 (*)    AST 80 (*)    ALT 83 (*)    All other components within normal limits  CULTURE, BLOOD (ROUTINE X 2)  CULTURE, BLOOD (ROUTINE X 2)  CBC WITH  DIFFERENTIAL/PLATELET  LIPASE, BLOOD  URINALYSIS, COMPLETE (UACMP) WITH MICROSCOPIC  TROPONIN I (HIGH SENSITIVITY)    EKG EKG Interpretation  Date/Time:  Friday March 05 2019 06:52:28 EDT Ventricular Rate:  115 PR Interval:  138 QRS Duration: 92 QT Interval:  316 QTC Calculation: 437 R Axis:   -67 Text Interpretation:  Sinus tachycardia Left anterior fascicular block Cannot rule out Anterior infarct , age undetermined Abnormal ECG When compared with ECG of 03-Mar-2019 15:24,  PREVIOUS ECG IS PRESENT Confirmed by UNCONFIRMED, DOCTOR ([id number]), editor Mel Almond, Tammy [phone]) on 03/05/2019 7:36:48 AM   Radiology Ct Abdomen Pelvis Wo Contrast  Result Date: 03/05/2019 CLINICAL DATA:  Abdominal distention with nausea and vomiting EXAM: CT ABDOMEN AND PELVIS WITHOUT CONTRAST TECHNIQUE: Multidetector CT imaging of the abdomen and pelvis was performed following the standard protocol without oral or IV contrast. COMPARISON:  July 22, 2008 FINDINGS: Lower chest: There is bibasilar atelectasis. There is apparent consolidation in a portion of the posterior segment right lower lobe. Hepatobiliary: A small portion of the dome of the liver is not imaged on this study. No focal liver lesions are evident on this noncontrast enhanced study. There is cholelithiasis. Gallbladder wall does not appear appreciably thickened by CT. There is no evident biliary duct dilatation. Pancreas: There is no pancreatic mass or inflammatory focus. Spleen: No splenic lesions are evident. Adrenals/Urinary Tract: Adrenals bilaterally appear normal. There are apparent parapelvic cysts within the left kidney, largest measuring 2.1 x 2.1 cm. There is no appreciable hydronephrosis on either side. There is no evident renal or ureteral calculus on either side. Note that there are phleboliths in the pelvis which are near but separate from the distal left ureter, also present on previous study. Urinary bladder is midline with wall thickness  within normal limits. Stomach/Bowel: There is no appreciable bowel wall or mesenteric thickening. Colon is largely collapsed. Terminal ileum appears normal. There is lipomatous infiltration of the ileocecal valve. No evident bowel obstruction. No free air or portal venous air. Vascular/Lymphatic: There is no abdominal aortic aneurysm. No vascular lesions are evident. There is no adenopathy in the abdomen or pelvis by size criteria. There are scattered subcentimeter mesenteric lymph nodes, considered nonspecific. Reproductive: Uterus is in the midline. No pelvic masses are evident. Other: Appendix is diminutive. There is no periappendiceal region inflammation. No abscess or ascites evident in the abdomen or pelvis. Musculoskeletal: There are foci of degenerative change in the lumbar spine. There are no blastic or lytic bone lesions. No intramuscular or abdominal wall lesion evident. IMPRESSION: 1. Area of apparent pneumonia in the posterior aspect of the right lower lobe, incompletely visualized. There is also patchy atelectatic change in both lung bases posteriorly. 2.  Cholelithiasis. 3. No evident bowel obstruction. No abscess in the abdomen or pelvis. No periappendiceal region inflammatory change. 4. No evident renal or ureteral calculus. No hydronephrosis. Urinary bladder wall thickness within normal limits. 5. Subcentimeter mesenteric lymph nodes are considered nonspecific. In the appropriate clinical setting, a degree of mesenteric adenitis could present in this manner. Electronically Signed   By: Lowella Grip III M.D.   On: 03/05/2019 09:11   Ct Head Wo Contrast  Result Date: 03/03/2019 CLINICAL DATA:  58 year old female with head trauma. EXAM: CT HEAD WITHOUT CONTRAST TECHNIQUE: Contiguous axial images were obtained from the base of the skull through the vertex without intravenous contrast. COMPARISON:  None. FINDINGS: Brain: The ventricular and sulci appropriate size for patient's age. The  gray-white matter discrimination is preserved. There is no acute intracranial hemorrhage. No mass effect or midline shift. No extra-axial fluid collection. Vascular: No hyperdense vessel or unexpected calcification. Skull: Normal. Negative for fracture or focal lesion. Sinuses/Orbits: No acute finding. Other: None IMPRESSION: No acute intracranial pathology. Electronically Signed   By: Anner Crete M.D.   On: 03/03/2019 17:57    Procedures Procedures (including critical care time)  Medications Ordered in ED Medications  sodium chloride 0.9 % bolus 1,000 mL (has no administration in  time range)  famotidine (PEPCID) IVPB 20 mg premix (20 mg Intravenous New Bag/Given 03/05/19 0906)  Ampicillin-Sulbactam (UNASYN) 3 g in sodium chloride 0.9 % 100 mL IVPB (has no administration in time range)  ondansetron (ZOFRAN-ODT) disintegrating tablet 4 mg (4 mg Oral Given 03/05/19 0912)  sodium chloride 0.9 % bolus 1,000 mL (1,000 mLs Intravenous New Bag/Given 03/05/19 0829)  pantoprazole (PROTONIX) injection 40 mg (40 mg Intravenous Given 03/05/19 0830)  alum & mag hydroxide-simeth (MAALOX/MYLANTA) 200-200-20 MG/5ML suspension 30 mL (30 mLs Oral Given 03/05/19 0901)    And  lidocaine (XYLOCAINE) 2 % viscous mouth solution 15 mL (15 mLs Oral Given 03/05/19 0901)     Initial Impression / Assessment and Plan / ED Course  I have reviewed the triage vital signs and the nursing notes.  Pertinent labs & imaging results that were available during my care of the patient were reviewed by me and considered in my medical decision making (see chart for details).  Clinical Course as of Mar 04 933  Fri Mar 05, 2019  0758 58 yo F here with ongoing n/v/d. Suspect this is ongoing viral GI illness but must also consider severe gastritis, colitis, diverticulitis. Less likely cholecystitis given normal AlkP/Bili and no RUQ TTP. Less likely appendicitis. IVF, antacids started. Will check CT.   [CI]    Clinical Course User  Index [CI] Duffy Bruce, MD       CT scan shows PNA, no intra-abd pathology. I suspect this is 2/2 aspiration in setting of n/v/d. She remains tachycardic, vomiting, and borderline febrile. Admit for intractable n/v with aspiration PNA.  ADDENDUM: COVID+. I still suspect PNA is aspiration but will transfer to [facility].  Final Clinical Impressions(s) / ED Diagnoses   Final diagnoses:  Aspiration pneumonia of right lower lobe due to gastric secretions (HCC)  Nausea vomiting and diarrhea    ED Discharge Orders    None       Duffy Bruce, MD 03/05/19 [id number]    Duffy Bruce, MD 03/05/19 1213

## 2019-03-06 LAB — COMPREHENSIVE METABOLIC PANEL
ALT: 69 U/L — ABNORMAL HIGH (ref 0–44)
AST: 49 U/L — ABNORMAL HIGH (ref 15–41)
Albumin: 2.9 g/dL — ABNORMAL LOW (ref 3.5–5.0)
Alkaline Phosphatase: 91 U/L (ref 38–126)
Anion gap: 9 (ref 5–15)
BUN: 12 mg/dL (ref 6–20)
CO2: 24 mmol/L (ref 22–32)
Calcium: 8.4 mg/dL — ABNORMAL LOW (ref 8.9–10.3)
Chloride: 108 mmol/L (ref 98–111)
Creatinine, Ser: 0.79 mg/dL (ref 0.44–1.00)
GFR calc Af Amer: >60 mL/min (ref >60)
GFR calc non Af Amer: >60 mL/min (ref >60)
Glucose, Bld: 131 mg/dL — ABNORMAL HIGH (ref 70–99)
Potassium: 3.9 mmol/L (ref 3.5–5.1)
Sodium: 141 mmol/L (ref 135–145)
Total Bilirubin: 0.6 mg/dL (ref 0.3–1.2)
Total Protein: 6.2 g/dL — ABNORMAL LOW (ref 6.5–8.1)

## 2019-03-06 LAB — CBC WITH DIFFERENTIAL/PLATELET
Abs Immature Granulocytes: 0.02 10*3/uL (ref 0.00–0.07)
Basophils Absolute: 0 10*3/uL (ref 0.0–0.1)
Basophils Relative: 0 %
Eosinophils Absolute: 0 10*3/uL (ref 0.0–0.5)
Eosinophils Relative: 0 %
HCT: 35.6 % — ABNORMAL LOW (ref 36.0–46.0)
Hemoglobin: 11.5 g/dL — ABNORMAL LOW (ref 12.0–15.0)
Immature Granulocytes: 0 %
Lymphocytes Relative: 10 %
Lymphs Abs: 0.5 10*3/uL — ABNORMAL LOW (ref 0.7–4.0)
MCH: 26.7 pg (ref 26.0–34.0)
MCHC: 32.3 g/dL (ref 30.0–36.0)
MCV: 82.6 fL (ref 80.0–100.0)
Monocytes Absolute: 0.2 10*3/uL (ref 0.1–1.0)
Monocytes Relative: 4 %
Neutro Abs: 3.9 10*3/uL (ref 1.7–7.7)
Neutrophils Relative %: 86 %
Platelets: 173 10*3/uL (ref 150–400)
RBC: 4.31 MIL/uL (ref 3.87–5.11)
RDW: 13.3 % (ref 11.5–15.5)
WBC: 4.5 10*3/uL (ref 4.0–10.5)
nRBC: 0 % (ref 0.0–0.2)

## 2019-03-06 LAB — MAGNESIUM: Magnesium: 2.1 mg/dL (ref 1.7–2.4)

## 2019-03-06 LAB — C-REACTIVE PROTEIN: CRP: 12.3 mg/dL — ABNORMAL HIGH (ref <1.0)

## 2019-03-06 LAB — D-DIMER, QUANTITATIVE: D-Dimer, Quant: 1.03 ug/mL-FEU — ABNORMAL HIGH (ref 0.00–0.50)

## 2019-03-06 LAB — FERRITIN: Ferritin: 383 ng/mL — ABNORMAL HIGH (ref 11–307)

## 2019-03-06 MED ORDER — ENOXAPARIN SODIUM 60 MG/0.6ML ~~LOC~~ SOLN
0.5000 mg/kg | SUBCUTANEOUS | Status: DC
  Administered 2019-03-06 – 2019-03-08 (×3): 50 mg via SUBCUTANEOUS
  Filled 2019-03-06 (×3): qty 0.6

## 2019-03-06 NOTE — Progress Notes (Signed)
PROGRESS NOTE    Kathryn Torres  [id number] DOB: August 24, 1960 DOA: 03/05/2019 PCP: Crecencio Mc, MD      Brief Narrative:  Kathryn Torres is a 58 y.o. F with hx HTN, remote cutaneous melanoma and HELLP syndrome 2002 who presents with persistent worsening diarrhea, vomiting, and inability to take PO for several days.  In the ER, CT abdomen was unremarkable except for pneumonia in lung fields, and SARS-CoV-2 positive.      Assessment & Plan:  Coronavirus pneumonitis with acute hypoxic respiratory failure In the setting of the ongoing 2020 COVID-19 pandemic. Infiltrates on chest radiography.  -Continue remdesivir day 2 of 5 -Continue dexamethasone  -VTE PPx with Lovenox -Continue Zinc and Vitamin C -Clears, ADAT   Hypertension BP controlled -Continue losartan, metoprolol   Transaminitis From COVID -Monitor closely     MDM and disposition: The below labs and imaging reports were reviewed and summarized above.  Medication management as above.  The patient was admitted with COVID 19 and pneumonitis.  We will start remdesivir, monitor oral intake and wean O2 as able.         DVT prophylaxis: Lovenox Code Status: FULL Family Communication: Husband     Procedures:   CT abdomen and pelvis 9/11 -- unremarkable except pneumonia  Antimicrobials:   None   Culture data:   9/11 blood culture x2 NGTD      Subjective: Her nausea is slightly better.  She was able to sip some fluids this morning.  No fever, no headache, no focal weakness, numbness, vision changes, seizures.  Mild cough, no chest pain.  Objective: Vitals:   03/06/19 0456 03/06/19 0817 03/06/19 1000 03/06/19 1005  BP: 116/71 (!) 118/55 (!) 127/58   Pulse: 84 97 83   Resp: (!) 27 20 18    Temp: 99.1 F (37.3 C) 98.6 F (37 C)    TempSrc: Oral Oral    SpO2: 93% 92%  93%  Weight:      Height:        Intake/Output Summary (Last 24 hours) at 03/06/2019 1123 Last data filed at  03/06/2019 0900 Gross per 24 hour  Intake 824.4 ml  Output -  Net 824.4 ml   Filed Weights   03/05/19 1651  Weight: 95.8 kg    Examination: General appearance:  adult female, alert and in no acute distress.   HEENT: Anicteric, conjunctiva pink, lids and lashes normal. No nasal deformity, discharge, epistaxis.  Lips moist, dentition normal, oropharynx tacky dry, no oral lesions, hearing normal.   Skin: Warm and dry.  No jaundice.  No suspicious rashes or lesions. Cardiac: RRR, nl S1-S2, no murmurs appreciated.  Capillary refill is brisk.  JVP normal.  No LE edema.  Radial pulses 2+ and symmetric. Respiratory: Normal respiratory rate and rhythm.  CTAB without rales or wheezes. Abdomen: Abdomen soft.  No TTP or guarding. No ascites, distension, hepatosplenomegaly.   MSK: No deformities or effusions. Neuro: Awake and alert.  EOMI, moves all extremities. Speech fluent.    Psych: Sensorium intact and responding to questions, attention normal. Affect normal.  Judgment and insight appear normal.       Data Reviewed: I have personally reviewed following labs and imaging studies:  CBC: Recent Labs  Lab 03/03/19 1533 03/05/19 0655 03/06/19 0156  WBC 7.8 5.6 4.5  NEUTROABS  --  4.3 3.9  HGB 14.7 13.1 11.5*  HCT 43.7 39.4 35.6*  MCV 78.7* 80.1 82.6  PLT 205 184 173  Basic Metabolic Panel: Recent Labs  Lab 03/03/19 1533 03/05/19 0655 03/06/19 0156  NA 139 138 141  K 3.6 3.6 3.9  CL 102 102 108  CO2 22 26 24   GLUCOSE 153* 115* 131*  BUN 13 12 12   CREATININE 0.88 0.77 0.79  CALCIUM 9.6 8.9 8.4*  MG  --   --  2.1   GFR: Estimated Creatinine Clearance: 87.4 mL/min (by C-G formula based on SCr of 0.79 mg/dL). Liver Function Tests: Recent Labs  Lab 03/03/19 1533 03/05/19 0655 03/06/19 0156  AST 56* 80* 49*  ALT 48* 83* 69*  ALKPHOS 97 94 91  BILITOT 0.8 0.9 0.6  PROT 8.0 6.9 6.2*  ALBUMIN 4.0 3.5 2.9*   Recent Labs  Lab 03/03/19 1533 03/05/19 0655  LIPASE 20  22   No results for input(s): AMMONIA in the last 168 hours. Coagulation Profile: No results for input(s): INR, PROTIME in the last 168 hours. Cardiac Enzymes: No results for input(s): CKTOTAL, CKMB, CKMBINDEX, TROPONINI in the last 168 hours. BNP (last 3 results) No results for input(s): PROBNP in the last 8760 hours. HbA1C: No results for input(s): HGBA1C in the last 72 hours. CBG: No results for input(s): GLUCAP in the last 168 hours. Lipid Profile: No results for input(s): CHOL, HDL, LDLCALC, TRIG, CHOLHDL, LDLDIRECT in the last 72 hours. Thyroid Function Tests: No results for input(s): TSH, T4TOTAL, FREET4, T3FREE, THYROIDAB in the last 72 hours. Anemia Panel: Recent Labs    03/05/19 1620 03/06/19 0156  FERRITIN 435* 383*   Urine analysis:    Component Value Date/Time   COLORURINE YELLOW (A) 03/05/2019 1113   APPEARANCEUR CLEAR (A) 03/05/2019 1113   LABSPEC 1.016 03/05/2019 1113   PHURINE 6.0 03/05/2019 1113   GLUCOSEU NEGATIVE 03/05/2019 1113   GLUCOSEU NEGATIVE 06/22/2014 0902   HGBUR NEGATIVE 03/05/2019 1113   BILIRUBINUR NEGATIVE 03/05/2019 1113   KETONESUR 5 (A) 03/05/2019 1113   PROTEINUR NEGATIVE 03/05/2019 1113   UROBILINOGEN 0.2 06/22/2014 0902   NITRITE NEGATIVE 03/05/2019 1113   LEUKOCYTESUR NEGATIVE 03/05/2019 1113   Sepsis Labs: @LABRCNTIP (procalcitonin:4,lacticacidven:4)  ) Recent Results (from the past 240 hour(s))  Blood culture (routine x 2)     Status: None (Preliminary result)   Collection Time: 03/05/19 10:03 AM   Specimen: BLOOD  Result Value Ref Range Status   Specimen Description BLOOD RIGHT ANTECUBITAL  Final   Special Requests   Final    BOTTLES DRAWN AEROBIC AND ANAEROBIC Blood Culture adequate volume   Culture   Final    NO GROWTH < 24 HOURS Performed at [facility], [address], [place], [place] [address]    Report Status PENDING  Incomplete  Blood culture (routine x 2)     Status: None (Preliminary result)    Collection Time: 03/05/19 10:15 AM   Specimen: BLOOD  Result Value Ref Range Status   Specimen Description BLOOD BLOOD RIGHT HAND  Final   Special Requests   Final    BOTTLES DRAWN AEROBIC ONLY Blood Culture results may not be optimal due to an inadequate volume of blood received in culture bottles   Culture   Final    NO GROWTH < 24 HOURS Performed at [facility], [address] [place], [place] [address]    Report Status PENDING  Incomplete  SARS Coronavirus 2 [facility] order, Performed in [facility] hospital lab) Nasopharyngeal Nasopharyngeal Swab     Status: Abnormal   Collection Time: 03/05/19 10:26 AM   Specimen: Nasopharyngeal  Swab  Result Value Ref Range Status   SARS Coronavirus 2 POSITIVE (A) NEGATIVE Final    Comment: RESULT CALLED TO, READ BACK BY AND VERIFIED WITH: BRIANNA CHATMON AT [id number] 03/05/2019 SDR (NOTE) If result is NEGATIVE SARS-CoV-2 target nucleic acids are NOT DETECTED. The SARS-CoV-2 RNA is generally detectable in upper and lower  respiratory specimens during the acute phase of infection. The lowest  concentration of SARS-CoV-2 viral copies this assay can detect is 250  copies / mL. A negative result does not preclude SARS-CoV-2 infection  and should not be used as the sole basis for treatment or other  patient management decisions.  A negative result may occur with  improper specimen collection / handling, submission of specimen other  than nasopharyngeal swab, presence of viral mutation(s) within the  areas targeted by this assay, and inadequate number of viral copies  (<250 copies / mL). A negative result must be combined with clinical  observations, patient history, and epidemiological information. If result is POSITIVE SARS-CoV-2 target nucleic acids are DETECTED.  The SARS-CoV-2 RNA is generally detectable in upper and lower  respiratory specimens during the acute phase of infection.  Positive  results are indicative of active infection  with SARS-CoV-2.  Clinical  correlation with patient history and other diagnostic information is  necessary to determine patient infection status.  Positive results do  not rule out bacterial infection or co-infection with other viruses. If result is PRESUMPTIVE POSTIVE SARS-CoV-2 nucleic acids MAY BE PRESENT.   A presumptive positive result was obtained on the submitted specimen  and confirmed on repeat testing.  While 2019 novel coronavirus  (SARS-CoV-2) nucleic acids may be present in the submitted sample  additional confirmatory testing may be necessary for epidemiological  and / or clinical management purposes  to differentiate between  SARS-CoV-2 and other Sarbecovirus currently known to infect humans.  If clinically indicated additional testing with an alternate test  methodology [phone])  is advised. The SARS-CoV-2 RNA is generally  detectable in upper and lower respiratory specimens during the acute  phase of infection. The expected result is Negative. Fact Sheet for Patients:  StrictlyIdeas.no Fact Sheet for Healthcare Providers: BankingDealers.co.za This test is not yet approved or cleared by the [place] FDA and has been authorized for detection and/or diagnosis of SARS-CoV-2 by FDA under an Emergency Use Authorization (EUA).  This EUA will remain in effect (meaning this test can be used) for the duration of the COVID-19 declaration under Section 564(b)(1) of the Act, 21 U.S.C. section 360bbb-3(b)(1), unless the authorization is terminated or revoked sooner. Performed at [facility], [address] [place], [place] [address]          Radiology Studies: Ct Abdomen Pelvis Wo Contrast  Result Date: 03/05/2019 CLINICAL DATA:  Abdominal distention with nausea and vomiting EXAM: CT ABDOMEN AND PELVIS WITHOUT CONTRAST TECHNIQUE: Multidetector CT imaging of the abdomen and pelvis was performed following the  standard protocol without oral or IV contrast. COMPARISON:  July 22, 2008 FINDINGS: Lower chest: There is bibasilar atelectasis. There is apparent consolidation in a portion of the posterior segment right lower lobe. Hepatobiliary: A small portion of the dome of the liver is not imaged on this study. No focal liver lesions are evident on this noncontrast enhanced study. There is cholelithiasis. Gallbladder wall does not appear appreciably thickened by CT. There is no evident biliary duct dilatation. Pancreas: There is no pancreatic mass or inflammatory focus. Spleen: No splenic lesions are evident. Adrenals/Urinary Tract:  Adrenals bilaterally appear normal. There are apparent parapelvic cysts within the left kidney, largest measuring 2.1 x 2.1 cm. There is no appreciable hydronephrosis on either side. There is no evident renal or ureteral calculus on either side. Note that there are phleboliths in the pelvis which are near but separate from the distal left ureter, also present on previous study. Urinary bladder is midline with wall thickness within normal limits. Stomach/Bowel: There is no appreciable bowel wall or mesenteric thickening. Colon is largely collapsed. Terminal ileum appears normal. There is lipomatous infiltration of the ileocecal valve. No evident bowel obstruction. No free air or portal venous air. Vascular/Lymphatic: There is no abdominal aortic aneurysm. No vascular lesions are evident. There is no adenopathy in the abdomen or pelvis by size criteria. There are scattered subcentimeter mesenteric lymph nodes, considered nonspecific. Reproductive: Uterus is in the midline. No pelvic masses are evident. Other: Appendix is diminutive. There is no periappendiceal region inflammation. No abscess or ascites evident in the abdomen or pelvis. Musculoskeletal: There are foci of degenerative change in the lumbar spine. There are no blastic or lytic bone lesions. No intramuscular or abdominal wall lesion  evident. IMPRESSION: 1. Area of apparent pneumonia in the posterior aspect of the right lower lobe, incompletely visualized. There is also patchy atelectatic change in both lung bases posteriorly. 2.  Cholelithiasis. 3. No evident bowel obstruction. No abscess in the abdomen or pelvis. No periappendiceal region inflammatory change. 4. No evident renal or ureteral calculus. No hydronephrosis. Urinary bladder wall thickness within normal limits. 5. Subcentimeter mesenteric lymph nodes are considered nonspecific. In the appropriate clinical setting, a degree of mesenteric adenitis could present in this manner. Electronically Signed   By: Lowella Grip III M.D.   On: 03/05/2019 09:11   Dg Chest Portable 1 View  Result Date: 03/05/2019 CLINICAL DATA:  Shortness of breath. Nausea, vomiting, and diarrhea. EXAM: PORTABLE CHEST 1 VIEW COMPARISON:  None. FINDINGS: The heart size and pulmonary vascularity are normal. There are small areas of atelectasis in the left lower lung zone. Slight peribronchial thickening at the right lung base. Minimal atelectasis in the right midzone. No effusions. No bone abnormality. IMPRESSION: Bronchitic changes with small areas of atelectasis bilaterally. Electronically Signed   By: Lorriane Shire M.D.   On: 03/05/2019 12:43        Scheduled Meds: . benzonatate  200 mg Oral TID  . cholecalciferol  1,000 Units Oral Daily  . dexamethasone (DECADRON) injection  6 mg Intravenous Q24H  . enoxaparin (LOVENOX) injection  40 mg Subcutaneous Q24H  . losartan  50 mg Oral Daily  . metoprolol succinate  25 mg Oral QHS  . vitamin C  500 mg Oral Daily  . zinc sulfate  220 mg Oral Daily   Continuous Infusions: . remdesivir 100 mg in NS 250 mL       LOS: 1 day    Time spent: 25 minutes      Edwin Dada, MD [facility] 03/06/2019, 11:23 AM     Please page through [place]:  [email or web address] Contact charge nurse for password If 7PM-7AM, please contact  night-coverage

## 2019-03-06 NOTE — TOC Initial Note (Signed)
Transition of Care [facility]) - Initial/Assessment Note    Patient Details  Name: Kathryn Torres MRN: [id number] Date of Birth: 11/10/1960  [facility]) CM/SW Contact:    Ninfa Meeker, RN Phone Number: [phone] (working remotely) 03/06/2019, 12:12 PM  Clinical Narrative:  58 yr old female admitted with COVID 19 pneumonitis. On oxygen 2L [place], started on Remdesivir. Patient from home with husband, Case manager will continue to follow for needs as patient medically improves. May she be blessed to do so.        Patient Goals and CMS Choice        Expected Discharge Plan and Services                                                Prior Living Arrangements/Services                       Activities of Daily Living Home Assistive Devices/Equipment: None ADL Screening (condition at time of admission) Patient's cognitive ability adequate to safely complete daily activities?: Yes Is the patient deaf or have difficulty hearing?: No Does the patient have difficulty seeing, even when wearing glasses/contacts?: No Does the patient have difficulty concentrating, remembering, or making decisions?: No Patient able to express need for assistance with ADLs?: No Does the patient have difficulty dressing or bathing?: No Independently performs ADLs?: Yes (appropriate for developmental age) Does the patient have difficulty walking or climbing stairs?: No Weakness of Legs: None Weakness of Arms/Hands: None  Permission Sought/Granted                  Emotional Assessment              Admission diagnosis:  COVID 19 Patient Active Problem List   Diagnosis Date Noted  . Acute gastroenteritis 03/05/2019  . Pneumonia due to COVID-19 virus 03/05/2019  . Allergic rhinitis 03/30/2017  . Irregular menses 06/23/2014  . Vitamin D deficiency 05/25/2013  . Visit for preventive health examination 05/18/2012  . Symptomatic cholelithiasis 05/18/2012  .  Obesity (BMI 30-39.9) 11/18/2011  . Previous pregnancy with hemolysis, elevated liver enzymes, and low platelet (HELLP) syndrome, antepartum   . Hypertension   . History of melanoma excision    PCP:  Crecencio Mc, MD Pharmacy:   [facility], [place] [address] Phone: [phone] Fax: [place], [place] [address] Phone: [phone] Fax: [phone]     Social Determinants of Health (SDOH) Interventions    Readmission Risk Interventions No flowsheet data found.

## 2019-03-06 NOTE — Progress Notes (Signed)
Husband updated.

## 2019-03-07 LAB — CBC WITH DIFFERENTIAL/PLATELET
Abs Immature Granulocytes: 0.03 10*3/uL (ref 0.00–0.07)
Basophils Absolute: 0 10*3/uL (ref 0.0–0.1)
Basophils Relative: 0 %
Eosinophils Absolute: 0 10*3/uL (ref 0.0–0.5)
Eosinophils Relative: 0 %
HCT: 36.3 % (ref 36.0–46.0)
Hemoglobin: 11.9 g/dL — ABNORMAL LOW (ref 12.0–15.0)
Immature Granulocytes: 1 %
Lymphocytes Relative: 19 %
Lymphs Abs: 0.7 10*3/uL (ref 0.7–4.0)
MCH: 26.6 pg (ref 26.0–34.0)
MCHC: 32.8 g/dL (ref 30.0–36.0)
MCV: 81.2 fL (ref 80.0–100.0)
Monocytes Absolute: 0.2 10*3/uL (ref 0.1–1.0)
Monocytes Relative: 6 %
Neutro Abs: 2.6 10*3/uL (ref 1.7–7.7)
Neutrophils Relative %: 74 %
Platelets: 198 10*3/uL (ref 150–400)
RBC: 4.47 MIL/uL (ref 3.87–5.11)
RDW: 13.2 % (ref 11.5–15.5)
WBC: 3.4 10*3/uL — ABNORMAL LOW (ref 4.0–10.5)
nRBC: 0 % (ref 0.0–0.2)

## 2019-03-07 LAB — COMPREHENSIVE METABOLIC PANEL
ALT: 59 U/L — ABNORMAL HIGH (ref 0–44)
AST: 31 U/L (ref 15–41)
Albumin: 3.1 g/dL — ABNORMAL LOW (ref 3.5–5.0)
Alkaline Phosphatase: 80 U/L (ref 38–126)
Anion gap: 8 (ref 5–15)
BUN: 17 mg/dL (ref 6–20)
CO2: 25 mmol/L (ref 22–32)
Calcium: 8.7 mg/dL — ABNORMAL LOW (ref 8.9–10.3)
Chloride: 109 mmol/L (ref 98–111)
Creatinine, Ser: 0.64 mg/dL (ref 0.44–1.00)
GFR calc Af Amer: >60 mL/min (ref >60)
GFR calc non Af Amer: >60 mL/min (ref >60)
Glucose, Bld: 170 mg/dL — ABNORMAL HIGH (ref 70–99)
Potassium: 3.9 mmol/L (ref 3.5–5.1)
Sodium: 142 mmol/L (ref 135–145)
Total Bilirubin: 0.7 mg/dL (ref 0.3–1.2)
Total Protein: 6.6 g/dL (ref 6.5–8.1)

## 2019-03-07 NOTE — Progress Notes (Signed)
Husband updated.

## 2019-03-07 NOTE — Progress Notes (Signed)
PROGRESS NOTE    Kathryn Torres  [id number] DOB: [birth year] DOA: 03/05/2019 PCP: Crecencio Mc, MD      Brief Narrative:  Kathryn Torres is a 58 y.o. F with hx HTN, remote cutaneous melanoma and HELLP syndrome 2002 who presents with persistent worsening diarrhea, vomiting, and inability to take PO for several days.  In the ER, CT abdomen was unremarkable except for pneumonia in lung fields, and SARS-CoV-2 positive.      Assessment & Plan:  Coronavirus pneumonitis with acute hypoxic respiratory failure Gastroenteritis from coronavirus infection with intractable vomiting In the setting of the ongoing 2020 COVID-19 pandemic. Infiltrates on chest radiography bilaterally.  Weaned from O2, feeling well. -Continue remdesivir day 3 of 5 -Continue dexamethasone day 3 -Continue VTE PPx with Lovenox -Continue Zinc and Vitamin C   Hypertension BP controlled -Continue losartan, metoprolol   Transaminitis From COVID, mild, stable -Monitor closely  Leukopenia Mild, 3.4K/ul today.     MDM and disposition: The below labs and imaging reports reviewed and summarized above.  Medication management as above.  The patient was admitted for COVID-19 pneumonitis.  We will continue IV remdesivir, until she is stably off oxygen, able to ambulate without dyspnea or dizziness and able to reliably take oral intake.        DVT prophylaxis: Lovenox Code Status: FULL Family Communication: Husband     Procedures:   CT abdomen and pelvis 9/11 -- unremarkable except pneumonia  Antimicrobials:   None   Culture data:   9/11 blood culture x2 NGTD      Subjective: Nausea is improved, appetite good.  No vomiting.  No fever, confusion, dizziness, seizures.    Objective: Vitals:   03/06/19 1955 03/07/19 0400 03/07/19 0750 03/07/19 0925  BP: (!) 128/52 128/64 (!) 114/54 124/90  Pulse: 83 78 74 93  Resp:   16   Temp: 99.3 F (37.4 C) 98.3 F (36.8 C) 98.7 F (37.1 C)    TempSrc: Oral Oral Oral   SpO2: 91% 96% 93% 93%  Weight:      Height:        Intake/Output Summary (Last 24 hours) at 03/07/2019 1208 Last data filed at 03/07/2019 0900 Gross per 24 hour  Intake 840 ml  Output -  Net 840 ml   Filed Weights   03/05/19 1651  Weight: 95.8 kg    Examination: General appearance: Adult female, sitting in recliner, no acute distress HEENT: Anicteric, conjunctiva pink, lids and lashes normal. No nasal deformity, discharge, epistaxis.  Lips moist, dentition normal, oropharynx tacky dry, no oral lesions, hearing normal.   Skin: Warm and dry.    Cardiac: RRR, no murmurs, JVP normal, no lower extremity edema. Respiratory: Normal respiratory rate and rhythm, lungs clear without rales or wheezes MSK: Normal muscle bulk and tone Neuro: Awake and alert, extraocular movements intact, moves all extremities with normal coordination, speech fluent.    Psych: Sensorium intact responding to questions, attention normal, affect normal, judgment insight appear normal       Data Reviewed: I have personally reviewed following labs and imaging studies:  CBC: Recent Labs  Lab 03/03/19 1533 03/05/19 0655 03/06/19 0156 03/07/19 0234  WBC 7.8 5.6 4.5 3.4*  NEUTROABS  --  4.3 3.9 2.6  HGB 14.7 13.1 11.5* 11.9*  HCT 43.7 39.4 35.6* 36.3  MCV 78.7* 80.1 82.6 81.2  PLT 205 184 173 [id number]   Basic Metabolic Panel: Recent Labs  Lab 03/03/19 1533 03/05/19 0655 03/06/19 0156 03/07/19  0234  NA 139 138 141 142  K 3.6 3.6 3.9 3.9  CL 102 102 108 109  CO2 22 26 24 25   GLUCOSE 153* 115* 131* 170*  BUN 13 12 12 17   CREATININE 0.88 0.77 0.79 0.64  CALCIUM 9.6 8.9 8.4* 8.7*  MG  --   --  2.1  --    GFR: Estimated Creatinine Clearance: 87.4 mL/min (by C-G formula based on SCr of 0.64 mg/dL). Liver Function Tests: Recent Labs  Lab 03/03/19 1533 03/05/19 0655 03/06/19 0156 03/07/19 0234  AST 56* 80* 49* 31  ALT 48* 83* 69* 59*  ALKPHOS 97 94 91 80  BILITOT  0.8 0.9 0.6 0.7  PROT 8.0 6.9 6.2* 6.6  ALBUMIN 4.0 3.5 2.9* 3.1*   Recent Labs  Lab 03/03/19 1533 03/05/19 0655  LIPASE 20 22   No results for input(s): AMMONIA in the last 168 hours. Coagulation Profile: No results for input(s): INR, PROTIME in the last 168 hours. Cardiac Enzymes: No results for input(s): CKTOTAL, CKMB, CKMBINDEX, TROPONINI in the last 168 hours. BNP (last 3 results) No results for input(s): PROBNP in the last 8760 hours. HbA1C: No results for input(s): HGBA1C in the last 72 hours. CBG: No results for input(s): GLUCAP in the last 168 hours. Lipid Profile: No results for input(s): CHOL, HDL, LDLCALC, TRIG, CHOLHDL, LDLDIRECT in the last 72 hours. Thyroid Function Tests: No results for input(s): TSH, T4TOTAL, FREET4, T3FREE, THYROIDAB in the last 72 hours. Anemia Panel: Recent Labs    03/05/19 1620 03/06/19 0156  FERRITIN 435* 383*   Urine analysis:    Component Value Date/Time   COLORURINE YELLOW (A) 03/05/2019 1113   APPEARANCEUR CLEAR (A) 03/05/2019 1113   LABSPEC 1.016 03/05/2019 1113   PHURINE 6.0 03/05/2019 1113   GLUCOSEU NEGATIVE 03/05/2019 1113   GLUCOSEU NEGATIVE 06/22/2014 0902   HGBUR NEGATIVE 03/05/2019 1113   BILIRUBINUR NEGATIVE 03/05/2019 1113   KETONESUR 5 (A) 03/05/2019 1113   PROTEINUR NEGATIVE 03/05/2019 1113   UROBILINOGEN 0.2 06/22/2014 0902   NITRITE NEGATIVE 03/05/2019 1113   LEUKOCYTESUR NEGATIVE 03/05/2019 1113   Sepsis Labs: @LABRCNTIP (procalcitonin:4,lacticacidven:4)  ) Recent Results (from the past 240 hour(s))  Blood culture (routine x 2)     Status: None (Preliminary result)   Collection Time: 03/05/19 10:03 AM   Specimen: BLOOD  Result Value Ref [place] Status   Specimen Description BLOOD RIGHT ANTECUBITAL  Final   Special Requests   Final    BOTTLES DRAWN AEROBIC AND ANAEROBIC Blood Culture adequate volume   Culture   Final    NO GROWTH 2 DAYS Performed at [facility], [address],  [place], [place] [address]    Report Status PENDING  Incomplete  Blood culture (routine x 2)     Status: None (Preliminary result)   Collection Time: 03/05/19 10:15 AM   Specimen: BLOOD  Result Value Ref [place] Status   Specimen Description BLOOD BLOOD RIGHT HAND  Final   Special Requests   Final    BOTTLES DRAWN AEROBIC ONLY Blood Culture results may not be optimal due to an inadequate volume of blood received in culture bottles   Culture   Final    NO GROWTH 2 DAYS Performed at [facility], [address] [place], [place] [address]    Report Status PENDING  Incomplete  SARS Coronavirus 2 [facility] order, Performed in [facility] hospital lab) Nasopharyngeal Nasopharyngeal Swab     Status: Abnormal   Collection Time: 03/05/19 10:26  AM   Specimen: Nasopharyngeal Swab  Result Value Ref [place] Status   SARS Coronavirus 2 POSITIVE (A) NEGATIVE Final    Comment: RESULT CALLED TO, READ BACK BY AND VERIFIED WITH: BRIANNA CHATMON AT 1136 03/05/2019 SDR (NOTE) If result is NEGATIVE SARS-CoV-2 target nucleic acids are NOT DETECTED. The SARS-CoV-2 RNA is generally detectable in upper and lower  respiratory specimens during the acute phase of infection. The lowest  concentration of SARS-CoV-2 viral copies this assay can detect is 250  copies / mL. A negative result does not preclude SARS-CoV-2 infection  and should not be used as the sole basis for treatment or other  patient management decisions.  A negative result may occur with  improper specimen collection / handling, submission of specimen other  than nasopharyngeal swab, presence of viral mutation(s) within the  areas targeted by this assay, and inadequate number of viral copies  (<250 copies / mL). A negative result must be combined with clinical  observations, patient history, and epidemiological information. If result is POSITIVE SARS-CoV-2 target nucleic acids are DETECTED.  The SARS-CoV-2 RNA is generally detectable in upper  and lower  respiratory specimens during the acute phase of infection.  Positive  results are indicative of active infection with SARS-CoV-2.  Clinical  correlation with patient history and other diagnostic information is  necessary to determine patient infection status.  Positive results do  not rule out bacterial infection or co-infection with other viruses. If result is PRESUMPTIVE POSTIVE SARS-CoV-2 nucleic acids MAY BE PRESENT.   A presumptive positive result was obtained on the submitted specimen  and confirmed on repeat testing.  While 2019 novel coronavirus  (SARS-CoV-2) nucleic acids may be present in the submitted sample  additional confirmatory testing may be necessary for epidemiological  and / or clinical management purposes  to differentiate between  SARS-CoV-2 and other Sarbecovirus currently known to infect humans.  If clinically indicated additional testing with an alternate test  methodology [phone])  is advised. The SARS-CoV-2 RNA is generally  detectable in upper and lower respiratory specimens during the acute  phase of infection. The expected result is Negative. Fact Sheet for Patients:  StrictlyIdeas.no Fact Sheet for Healthcare Providers: BankingDealers.co.za This test is not yet approved or cleared by the [place] FDA and has been authorized for detection and/or diagnosis of SARS-CoV-2 by FDA under an Emergency Use Authorization (EUA).  This EUA will remain in effect (meaning this test can be used) for the duration of the COVID-19 declaration under Section 564(b)(1) of the Act, 21 U.S.C. section 360bbb-3(b)(1), unless the authorization is terminated or revoked sooner. Performed at [facility], [address] [place], [place] [address]          Radiology Studies: Dg Chest Portable 1 View  Result Date: 03/05/2019 CLINICAL DATA:  Shortness of breath. Nausea, vomiting, and diarrhea. EXAM:  PORTABLE CHEST 1 VIEW COMPARISON:  None. FINDINGS: The heart size and pulmonary vascularity are normal. There are small areas of atelectasis in the left lower lung zone. Slight peribronchial thickening at the right lung base. Minimal atelectasis in the right midzone. No effusions. No bone abnormality. IMPRESSION: Bronchitic changes with small areas of atelectasis bilaterally. Electronically Signed   By: Lorriane Shire M.D.   On: 03/05/2019 12:43        Scheduled Meds: . benzonatate  200 mg Oral TID  . cholecalciferol  1,000 Units Oral Daily  . dexamethasone (DECADRON) injection  6 mg Intravenous Q24H  . enoxaparin (LOVENOX) injection  0.5 mg/kg Subcutaneous Q24H  . losartan  50 mg Oral Daily  . metoprolol succinate  25 mg Oral QHS  . vitamin C  500 mg Oral Daily  . zinc sulfate  220 mg Oral Daily   Continuous Infusions: . remdesivir 100 mg in NS 250 mL 100 mg (03/06/19 1709)     LOS: 2 days    Time spent: 25 minutes      Edwin Dada, MD [facility] 03/07/2019, 12:08 PM     Please page through [place]:  [email or web address] Contact charge nurse for password If 7PM-7AM, please contact night-coverage

## 2019-03-08 LAB — COMPREHENSIVE METABOLIC PANEL
ALT: 48 U/L — ABNORMAL HIGH (ref 0–44)
AST: 24 U/L (ref 15–41)
Albumin: 3 g/dL — ABNORMAL LOW (ref 3.5–5.0)
Alkaline Phosphatase: 78 U/L (ref 38–126)
Anion gap: 7 (ref 5–15)
BUN: 22 mg/dL — ABNORMAL HIGH (ref 6–20)
CO2: 27 mmol/L (ref 22–32)
Calcium: 8.7 mg/dL — ABNORMAL LOW (ref 8.9–10.3)
Chloride: 107 mmol/L (ref 98–111)
Creatinine, Ser: 0.83 mg/dL (ref 0.44–1.00)
GFR calc Af Amer: >60 mL/min (ref >60)
GFR calc non Af Amer: >60 mL/min (ref >60)
Glucose, Bld: 144 mg/dL — ABNORMAL HIGH (ref 70–99)
Potassium: 4 mmol/L (ref 3.5–5.1)
Sodium: 141 mmol/L (ref 135–145)
Total Bilirubin: 0.5 mg/dL (ref 0.3–1.2)
Total Protein: 6.5 g/dL (ref 6.5–8.1)

## 2019-03-08 LAB — CBC WITH DIFFERENTIAL/PLATELET
Abs Immature Granulocytes: 0.09 10*3/uL — ABNORMAL HIGH (ref 0.00–0.07)
Basophils Absolute: 0 10*3/uL (ref 0.0–0.1)
Basophils Relative: 0 %
Eosinophils Absolute: 0 10*3/uL (ref 0.0–0.5)
Eosinophils Relative: 0 %
HCT: 36.5 % (ref 36.0–46.0)
Hemoglobin: 11.8 g/dL — ABNORMAL LOW (ref 12.0–15.0)
Immature Granulocytes: 2 %
Lymphocytes Relative: 14 %
Lymphs Abs: 0.7 10*3/uL (ref 0.7–4.0)
MCH: 26.7 pg (ref 26.0–34.0)
MCHC: 32.3 g/dL (ref 30.0–36.0)
MCV: 82.6 fL (ref 80.0–100.0)
Monocytes Absolute: 0.3 10*3/uL (ref 0.1–1.0)
Monocytes Relative: 6 %
Neutro Abs: 4.1 10*3/uL (ref 1.7–7.7)
Neutrophils Relative %: 78 %
Platelets: 283 10*3/uL (ref 150–400)
RBC: 4.42 MIL/uL (ref 3.87–5.11)
RDW: 13.2 % (ref 11.5–15.5)
WBC: 5.3 10*3/uL (ref 4.0–10.5)
nRBC: 0 % (ref 0.0–0.2)

## 2019-03-08 NOTE — Progress Notes (Signed)
Performed walk test with pt. Pt remained on RA during the whole test. Ambulated 2 laps around unit. No c/o SOB or chest pain. Saturations ranged from 90-95%. HR WNL. Pt back in room, on RA. Will continue to monitor pt

## 2019-03-08 NOTE — Progress Notes (Signed)
Husband updated.

## 2019-03-08 NOTE — Consult Note (Signed)
   [facility] CM Inpatient Consult   03/08/2019  Kathryn Torres [birth year] [id number]  Status: Pending [facility] Care Management, less than 7 days readmission: [facility] plan  Patient screened for unplanned readmission score and for [place] Management post hospital follow up as scheduled.  Review of patient's medical record reveals patient is Chart review reveals per MD notes:  [name] is a 58 y.o. F with hx HTN, remote cutaneous melanoma and HELLP syndrome 2002 who presents with persistent worsening diarrhea, vomiting, and inability to take PO for several days. In the ER, CT abdomen was unremarkable except for pneumonia in lung fields, and SARS-CoV-2 positive.  Plan: Make inpatient [facility] team aware that [place] Management is following for disposition and needs.  For questions, please contact:  Natividad Brood, RN BSN [facility] Liaison  [phone] business mobile phone Toll free office [phone]  Fax number: [phone] [email or web address]

## 2019-03-08 NOTE — Progress Notes (Signed)
PROGRESS NOTE    Kathryn Torres  [id number] DOB: 10/09/1960 DOA: 03/05/2019 PCP: Crecencio Mc, MD      Brief Narrative:  Kathryn Torres is a 58 y.o. F with hx HTN, remote cutaneous melanoma and HELLP syndrome 2002 who presents with persistent worsening diarrhea, vomiting, and inability to take PO for several days.  In Kathryn ER, CT abdomen was unremarkable except for pneumonia in lung fields, and SARS-CoV-2 positive.      Assessment & Plan:  Coronavirus pneumonitis with acute hypoxic respiratory failure Gastroenteritis from coronavirus infection with intractable vomiting Kathryn Torres was admitted with N/V and bilateral pneumonia by radiography in Kathryn setting of Kathryn ongoing 2020 COVID-19 pandemic.  She was started on remdesivir and steroids, needed supplemental O2 briefly, but subsequently weaned off and stable from a respiratory standpoint.  -Continue remdesivir day 4 of 5 -Continue dexamethasone day 4 -Continue VTE PPx with Lovenox -Continue Zinc and Vitamin C   Hypertension BP controlled -Continue losartan, metoprolol   Transaminitis From COVID, mild, no change -Monitor on remdesivir  Leukopenia Mild, normal today     MDM and disposition: Kathryn below labs and imaging reports reviewed and summarized above.  Medication management as above.  For Kathryn Torres was admitted with COVID-19 pneumonitis.  We will continue IV remdesivir, until she is daily off oxygen, able to ambulate without dyspnea or dizziness, and reliably able to take oral intake.  Likely home in 24 hours     DVT prophylaxis: Lovenox Code Status: FULL Family Communication: Husband     Procedures:   CT abdomen and pelvis 9/11 -- unremarkable except pneumonia  Antimicrobials:   None   Culture data:   9/11 blood culture x2 NGTD      Subjective: Her GI symptoms are resolved, no more nausea or vomiting.  Appetite good, able to tolerate p.o.  No fever, no confusion.  She is very  tired and dyspneic with exertion.       Objective: Vitals:   03/08/19 0150 03/08/19 0400 03/08/19 0738 03/08/19 0903  BP:  130/71 125/63 125/78  Pulse: (!) 52  (!) 58   Resp:  19 20   Temp:  99 F (37.2 C) 98.5 F (36.9 C)   TempSrc:  Oral Oral   SpO2: 94%  92%   Weight:      Height:        Intake/Output Summary (Last 24 hours) at 03/08/2019 1117 Last data filed at 03/08/2019 1000 Gross per 24 hour  Intake 1900 ml  Output 1 ml  Net 1899 ml   Filed Weights   03/05/19 1651  Weight: 95.8 kg    Examination: General appearance: Adult female, sitting in chair, no acute distress, interactive HEENT: Anicteric, conjunctival pink, lids and lashes normal.  No nasal deformity, discharge, or epistaxis.  Hearing normal.   Skin: Skin warm and dry    Cardiac: Regular rate and rhythm, no murmurs, JVP normal, no lower extremity edema Respiratory: Normal respiratory rate and rhythm, no wheezes or rales. MSK: Normal muscle bulk and tone. Neuro: Awake and alert, extraocular movements intact, moves all extremities with normal strength and coordination, speech fluent.    Psych: Sensorium intact responding to questions, attention normal, affect normal, judgment and insight appear normal.       Data Reviewed: I have personally reviewed following labs and imaging studies:  CBC: Recent Labs  Lab 03/03/19 1533 03/05/19 0655 03/06/19 0156 03/07/19 0234 03/08/19 0355  WBC 7.8 5.6 4.5 3.4* 5.3  NEUTROABS  --  4.3 3.9 2.6 4.1  HGB 14.7 13.1 11.5* 11.9* 11.8*  HCT 43.7 39.4 35.6* 36.3 36.5  MCV 78.7* 80.1 82.6 81.2 82.6  PLT 205 184 173 198 [id number]   Basic Metabolic Panel: Recent Labs  Lab 03/03/19 1533 03/05/19 0655 03/06/19 0156 03/07/19 0234 03/08/19 0355  NA 139 138 141 142 141  K 3.6 3.6 3.9 3.9 4.0  CL 102 102 108 109 107  CO2 22 26 24 25 27   GLUCOSE 153* 115* 131* 170* 144*  BUN 13 12 12 17  22*  CREATININE 0.88 0.77 0.79 0.64 0.83  CALCIUM 9.6 8.9 8.4* 8.7* 8.7*  MG   --   --  2.1  --   --    GFR: Estimated Creatinine Clearance: 84.2 mL/min (by C-G formula based on SCr of 0.83 mg/dL). Liver Function Tests: Recent Labs  Lab 03/03/19 1533 03/05/19 0655 03/06/19 0156 03/07/19 0234 03/08/19 0355  AST 56* 80* 49* 31 24  ALT 48* 83* 69* 59* 48*  ALKPHOS 97 94 91 80 78  BILITOT 0.8 0.9 0.6 0.7 0.5  PROT 8.0 6.9 6.2* 6.6 6.5  ALBUMIN 4.0 3.5 2.9* 3.1* 3.0*   Recent Labs  Lab 03/03/19 1533 03/05/19 0655  LIPASE 20 22   No results for input(s): AMMONIA in Kathryn last 168 hours. Coagulation Profile: No results for input(s): INR, PROTIME in Kathryn last 168 hours. Cardiac Enzymes: No results for input(s): CKTOTAL, CKMB, CKMBINDEX, TROPONINI in Kathryn last 168 hours. BNP (last 3 results) No results for input(s): PROBNP in Kathryn last 8760 hours. HbA1C: No results for input(s): HGBA1C in Kathryn last 72 hours. CBG: No results for input(s): GLUCAP in Kathryn last 168 hours. Lipid Profile: No results for input(s): CHOL, HDL, LDLCALC, TRIG, CHOLHDL, LDLDIRECT in Kathryn last 72 hours. Thyroid Function Tests: No results for input(s): TSH, T4TOTAL, FREET4, T3FREE, THYROIDAB in Kathryn last 72 hours. Anemia Panel: Recent Labs    03/05/19 1620 03/06/19 0156  FERRITIN 435* 383*   Urine analysis:    Component Value Date/Time   COLORURINE YELLOW (A) 03/05/2019 1113   APPEARANCEUR CLEAR (A) 03/05/2019 1113   LABSPEC 1.016 03/05/2019 1113   PHURINE 6.0 03/05/2019 1113   GLUCOSEU NEGATIVE 03/05/2019 1113   GLUCOSEU NEGATIVE 06/22/2014 0902   HGBUR NEGATIVE 03/05/2019 1113   BILIRUBINUR NEGATIVE 03/05/2019 1113   KETONESUR 5 (A) 03/05/2019 1113   PROTEINUR NEGATIVE 03/05/2019 1113   UROBILINOGEN 0.2 06/22/2014 0902   NITRITE NEGATIVE 03/05/2019 1113   LEUKOCYTESUR NEGATIVE 03/05/2019 1113   Sepsis Labs: @LABRCNTIP (procalcitonin:4,lacticacidven:4)  ) Recent Results (from Kathryn past 240 hour(s))  Blood culture (routine x 2)     Status: None (Preliminary result)    Collection Time: 03/05/19 10:03 AM   Specimen: BLOOD  Result Value Ref [place] Status   Specimen Description BLOOD RIGHT ANTECUBITAL  Final   Special Requests   Final    BOTTLES DRAWN AEROBIC AND ANAEROBIC Blood Culture adequate volume   Culture   Final    NO GROWTH 3 DAYS Performed at [facility], [address] [place], [place] [address]    Report Status PENDING  Incomplete  Blood culture (routine x 2)     Status: None (Preliminary result)   Collection Time: 03/05/19 10:15 AM   Specimen: BLOOD  Result Value Ref [place] Status   Specimen Description BLOOD BLOOD RIGHT HAND  Final   Special Requests   Final    BOTTLES DRAWN AEROBIC ONLY Blood Culture results may not  be optimal due to an inadequate volume of blood received in culture bottles   Culture   Final    NO GROWTH 3 DAYS Performed at [facility], [address] [place], [place] [address]    Report Status PENDING  Incomplete  SARS Coronavirus 2 [facility] order, Performed in [facility] hospital lab) Nasopharyngeal Nasopharyngeal Swab     Status: Abnormal   Collection Time: 03/05/19 10:26 AM   Specimen: Nasopharyngeal Swab  Result Value Ref [place] Status   SARS Coronavirus 2 POSITIVE (A) NEGATIVE Final    Comment: RESULT CALLED TO, READ BACK BY AND VERIFIED WITH: BRIANNA CHATMON AT [id number] 03/05/2019 SDR (NOTE) If result is NEGATIVE SARS-CoV-2 target nucleic acids are NOT DETECTED. Kathryn SARS-CoV-2 RNA is generally detectable in upper and lower  respiratory specimens during Kathryn acute phase of infection. Kathryn lowest  concentration of SARS-CoV-2 viral copies this assay can detect is 250  copies / mL. A negative result does not preclude SARS-CoV-2 infection  and should not be used as Kathryn sole basis for treatment or other  Torres management decisions.  A negative result may occur with  improper specimen collection / handling, submission of specimen other  than nasopharyngeal swab, presence of viral mutation(s)  within Kathryn  areas targeted by this assay, and inadequate number of viral copies  (<250 copies / mL). A negative result must be combined with clinical  observations, Torres history, and epidemiological information. If result is POSITIVE SARS-CoV-2 target nucleic acids are DETECTED.  Kathryn SARS-CoV-2 RNA is generally detectable in upper and lower  respiratory specimens during Kathryn acute phase of infection.  Positive  results are indicative of active infection with SARS-CoV-2.  Clinical  correlation with Torres history and other diagnostic information is  necessary to determine Torres infection status.  Positive results do  not rule out bacterial infection or co-infection with other viruses. If result is PRESUMPTIVE POSTIVE SARS-CoV-2 nucleic acids MAY BE PRESENT.   A presumptive positive result was obtained on Kathryn submitted specimen  and confirmed on repeat testing.  While 2019 novel coronavirus  (SARS-CoV-2) nucleic acids may be present in Kathryn submitted sample  additional confirmatory testing may be necessary for epidemiological  and / or clinical management purposes  to differentiate between  SARS-CoV-2 and other Sarbecovirus currently known to infect humans.  If clinically indicated additional testing with an alternate test  methodology [phone])  is advised. Kathryn SARS-CoV-2 RNA is generally  detectable in upper and lower respiratory specimens during Kathryn acute  phase of infection. Kathryn expected result is Negative. Fact Sheet for Patients:  StrictlyIdeas.no Fact Sheet for Healthcare Providers: BankingDealers.co.za This test is not yet approved or cleared by Kathryn [place] FDA and has been authorized for detection and/or diagnosis of SARS-CoV-2 by FDA under an Emergency Use Authorization (EUA).  This EUA will remain in effect (meaning this test can be used) for Kathryn duration of Kathryn COVID-19 declaration under Section 564(b)(1) of Kathryn Act,  21 U.S.C. section 360bbb-3(b)(1), unless Kathryn authorization is terminated or revoked sooner. Performed at [facility], [address] [place], [place] [address]          Radiology Studies: No results found.      Scheduled Meds: . benzonatate  200 mg Oral TID  . cholecalciferol  1,000 Units Oral Daily  . dexamethasone (DECADRON) injection  6 mg Intravenous Q24H  . enoxaparin (LOVENOX) injection  0.5 mg/kg Subcutaneous Q24H  . losartan  50 mg Oral Daily  . metoprolol  succinate  25 mg Oral QHS  . vitamin C  500 mg Oral Daily  . zinc sulfate  220 mg Oral Daily   Continuous Infusions: . remdesivir 100 mg in NS 250 mL 100 mg (03/07/19 1610)     LOS: 3 days    Time spent: 25 minutes      Edwin Dada, MD [facility] 03/08/2019, 11:17 AM     Please page through [place]:  [email or web address] Contact charge nurse for password If 7PM-7AM, please contact night-coverage

## 2019-03-09 DIAGNOSIS — E559 Vitamin D deficiency, unspecified: Secondary | ICD-10-CM

## 2019-03-09 LAB — CBC WITH DIFFERENTIAL/PLATELET
Abs Immature Granulocytes: 0.18 10*3/uL — ABNORMAL HIGH (ref 0.00–0.07)
Basophils Absolute: 0 10*3/uL (ref 0.0–0.1)
Basophils Relative: 0 %
Eosinophils Absolute: 0 10*3/uL (ref 0.0–0.5)
Eosinophils Relative: 0 %
HCT: 35.3 % — ABNORMAL LOW (ref 36.0–46.0)
Hemoglobin: 11.6 g/dL — ABNORMAL LOW (ref 12.0–15.0)
Immature Granulocytes: 3 %
Lymphocytes Relative: 15 %
Lymphs Abs: 0.9 10*3/uL (ref 0.7–4.0)
MCH: 27 pg (ref 26.0–34.0)
MCHC: 32.9 g/dL (ref 30.0–36.0)
MCV: 82.1 fL (ref 80.0–100.0)
Monocytes Absolute: 0.3 10*3/uL (ref 0.1–1.0)
Monocytes Relative: 5 %
Neutro Abs: 4.5 10*3/uL (ref 1.7–7.7)
Neutrophils Relative %: 77 %
Platelets: 250 10*3/uL (ref 150–400)
RBC: 4.3 MIL/uL (ref 3.87–5.11)
RDW: 13.2 % (ref 11.5–15.5)
WBC: 5.9 10*3/uL (ref 4.0–10.5)
nRBC: 0 % (ref 0.0–0.2)

## 2019-03-09 LAB — COMPREHENSIVE METABOLIC PANEL
ALT: 62 U/L — ABNORMAL HIGH (ref 0–44)
AST: 43 U/L — ABNORMAL HIGH (ref 15–41)
Albumin: 2.9 g/dL — ABNORMAL LOW (ref 3.5–5.0)
Alkaline Phosphatase: 80 U/L (ref 38–126)
Anion gap: 7 (ref 5–15)
BUN: 20 mg/dL (ref 6–20)
CO2: 27 mmol/L (ref 22–32)
Calcium: 8.5 mg/dL — ABNORMAL LOW (ref 8.9–10.3)
Chloride: 106 mmol/L (ref 98–111)
Creatinine, Ser: 0.62 mg/dL (ref 0.44–1.00)
GFR calc Af Amer: >60 mL/min (ref >60)
GFR calc non Af Amer: >60 mL/min (ref >60)
Glucose, Bld: 167 mg/dL — ABNORMAL HIGH (ref 70–99)
Potassium: 4.5 mmol/L (ref 3.5–5.1)
Sodium: 140 mmol/L (ref 135–145)
Total Bilirubin: 0.5 mg/dL (ref 0.3–1.2)
Total Protein: 6.1 g/dL — ABNORMAL LOW (ref 6.5–8.1)

## 2019-03-09 MED ORDER — SODIUM CHLORIDE 0.9 % IV SOLN
100.0000 mg | INTRAVENOUS | Status: AC
  Administered 2019-03-09: 100 mg via INTRAVENOUS
  Filled 2019-03-09: qty 20

## 2019-03-09 NOTE — Progress Notes (Signed)
Pt is being dc home in zero distress. Pt will be picked up by husband at 1300

## 2019-03-09 NOTE — Plan of Care (Signed)
Pt dc home in zero distress

## 2019-03-09 NOTE — Discharge Summary (Signed)
Physician Discharge Summary  Kathryn Torres [id number] DOB: 01/05/1961 DOA: 03/05/2019  PCP: Crecencio Mc, MD  Admit date: 03/05/2019 Discharge date: 03/09/2019  Admitted From: Home  Disposition:  Home   Recommendations for Outpatient Follow-up:  1. Follow up with PCP in 1-2 weeks 2. Please repeat LFTs in 1 week to monitor resolution of transaminitis     Home Health: None  Equipment/Devices: None  Discharge Condition: Good  CODE STATUS: FULL Diet recommendation: Regular  Brief/Interim Summary: Kathryn Torres is a 58 y.o. F with hx HTN, remote cutaneous melanoma and HELLP syndrome 2002 who presents with persistent worsening diarrhea, vomiting, and inability to take PO for several days.  In the ER, CT abdomen was unremarkable except for pneumonia in lung fields, and SARS-CoV-2 positive.       PRINCIPAL HOSPITAL DIAGNOSIS: COVID-19    Discharge Diagnoses:   Coronavirus pneumonitis with acute hypoxic respiratory failure Gastroenteritis from coronavirus infection with intractable vomiting The patient was admitted with N/V and bilateral pneumonia by radiography in the setting of the ongoing 2020 COVID-19 pandemic.  She was started on remdesivir and steroids, needed supplemental O2 briefly, but subsequently weaned off and stable from a respiratory standpoint.    Hypertension BP controlled   Transaminitis From COVID, mild.   -Repeat LFTs in 1-2 weeks   Leukopenia Mild, normalized in hospital.           Discharge Instructions  Discharge Instructions    Diet general   Complete by: As directed    Discharge instructions   Complete by: As directed    From Dr. Loleta Torres: You were admitted for coronavirus (Also known as COVID-19)  You were treated with an anti-virus medicine ("remdesivir") and an anti-inflammatory (a "steroid") while you were here.  You completed both courses while you were here.  If you have any lingering cough, you should  take the cough syrup we gave you here, Robitussin (with the ingredients "GUIAFENESIN" and "DEXTROMETHORPHAN")  You should purchase a pulse oximeter at your pharmacy after you leave, if you don't already have one.   Use it to check your oxygen level once or twice daily until you see your primary care doctor. If your oxygen level is ever LESS than 88% and doesn't get better, you should call your primary care doctor immediately.   HOW LONG TO REMAIN IN QUARANTINE: There is no absolutely correct answer to this and so our best answer is to be on the cautious side.  Based on what we know of the virus, you should isolate strictly until 21 days from your first symptoms.  Until you end your quarantine: If you have anyone in the home who has NOT had coronavirus:    -do not be in the same room with them until your self isolation is over    -wear a mask and have them wear a mask if you MUST be in the same room    -clean all hard surfaces (counters, doors, tables) twice a day    -use a separate bathroom at all times   Increase activity slowly   Complete by: As directed    MyChart COVID-19 home monitoring program   Complete by: Mar 09, 2019    Is the patient willing to use the [place] for home monitoring?: Yes   Temperature monitoring   Complete by: Mar 09, 2019    After how many days would you like to receive a notification of this patient's flowsheet entries?: 1  Allergies as of 03/09/2019      Reactions   Contrast Media [iodinated Diagnostic Agents]       Medication List    TAKE these medications   cholecalciferol 1000 units tablet Commonly known as: VITAMIN D Take 1,000 Units by mouth daily.   ergocalciferol 1.25 MG (50000 UT) capsule Commonly known as: Drisdol Take 1 capsule (50,000 Units total) by mouth once a week.   losartan 50 MG tablet Commonly known as: COZAAR TAKE 1 TABLET BY MOUTH DAILY.   meclizine 25 MG tablet Commonly known as: ANTIVERT Take 1 tablet  (25 mg total) by mouth 3 (three) times daily as needed for dizziness.   metoprolol succinate 25 MG 24 hr tablet Commonly known as: TOPROL-XL TAKE 1 TABLET (25 MG TOTAL) BY MOUTH DAILY. What changed: See the new instructions.   promethazine 12.5 MG tablet Commonly known as: PHENERGAN Take 1 tablet (12.5 mg total) by mouth every 6 (six) hours as needed for nausea or vomiting.      Follow-up Information    Crecencio Mc, MD Follow up.   Specialty: Internal Medicine Why: Call Dr. Derrel Nip for a follow up appointment in 1-2 weeks Contact information: [address] [place] [address] [phone]          Allergies  Allergen Reactions  . Contrast Media [Iodinated Diagnostic Agents]     Consultations:  None   Procedures/Studies: Ct Abdomen Pelvis Wo Contrast  Result Date: 03/05/2019 CLINICAL DATA:  Abdominal distention with nausea and vomiting EXAM: CT ABDOMEN AND PELVIS WITHOUT CONTRAST TECHNIQUE: Multidetector CT imaging of the abdomen and pelvis was performed following the standard protocol without oral or IV contrast. COMPARISON:  July 22, 2008 FINDINGS: Lower chest: There is bibasilar atelectasis. There is apparent consolidation in a portion of the posterior segment right lower lobe. Hepatobiliary: A small portion of the dome of the liver is not imaged on this study. No focal liver lesions are evident on this noncontrast enhanced study. There is cholelithiasis. Gallbladder wall does not appear appreciably thickened by CT. There is no evident biliary duct dilatation. Pancreas: There is no pancreatic mass or inflammatory focus. Spleen: No splenic lesions are evident. Adrenals/Urinary Tract: Adrenals bilaterally appear normal. There are apparent parapelvic cysts within the left kidney, largest measuring 2.1 x 2.1 cm. There is no appreciable hydronephrosis on either side. There is no evident renal or ureteral calculus on either side. Note that there are  phleboliths in the pelvis which are near but separate from the distal left ureter, also present on previous study. Urinary bladder is midline with wall thickness within normal limits. Stomach/Bowel: There is no appreciable bowel wall or mesenteric thickening. Colon is largely collapsed. Terminal ileum appears normal. There is lipomatous infiltration of the ileocecal valve. No evident bowel obstruction. No free air or portal venous air. Vascular/Lymphatic: There is no abdominal aortic aneurysm. No vascular lesions are evident. There is no adenopathy in the abdomen or pelvis by size criteria. There are scattered subcentimeter mesenteric lymph nodes, considered nonspecific. Reproductive: Uterus is in the midline. No pelvic masses are evident. Other: Appendix is diminutive. There is no periappendiceal region inflammation. No abscess or ascites evident in the abdomen or pelvis. Musculoskeletal: There are foci of degenerative change in the lumbar spine. There are no blastic or lytic bone lesions. No intramuscular or abdominal wall lesion evident. IMPRESSION: 1. Area of apparent pneumonia in the posterior aspect of the right lower lobe, incompletely visualized. There is also patchy atelectatic change  in both lung bases posteriorly. 2.  Cholelithiasis. 3. No evident bowel obstruction. No abscess in the abdomen or pelvis. No periappendiceal region inflammatory change. 4. No evident renal or ureteral calculus. No hydronephrosis. Urinary bladder wall thickness within normal limits. 5. Subcentimeter mesenteric lymph nodes are considered nonspecific. In the appropriate clinical setting, a degree of mesenteric adenitis could present in this manner. Electronically Signed   By: Lowella Grip III M.D.   On: 03/05/2019 09:11   Ct Head Wo Contrast  Result Date: 03/03/2019 CLINICAL DATA:  58 year old female with head trauma. EXAM: CT HEAD WITHOUT CONTRAST TECHNIQUE: Contiguous axial images were obtained from the base of the  skull through the vertex without intravenous contrast. COMPARISON:  None. FINDINGS: Brain: The ventricular and sulci appropriate size for patient's age. The gray-white matter discrimination is preserved. There is no acute intracranial hemorrhage. No mass effect or midline shift. No extra-axial fluid collection. Vascular: No hyperdense vessel or unexpected calcification. Skull: Normal. Negative for fracture or focal lesion. Sinuses/Orbits: No acute finding. Other: None IMPRESSION: No acute intracranial pathology. Electronically Signed   By: Anner Crete M.D.   On: 03/03/2019 17:57   Dg Chest Portable 1 View  Result Date: 03/05/2019 CLINICAL DATA:  Shortness of breath. Nausea, vomiting, and diarrhea. EXAM: PORTABLE CHEST 1 VIEW COMPARISON:  None. FINDINGS: The heart size and pulmonary vascularity are normal. There are small areas of atelectasis in the left lower lung zone. Slight peribronchial thickening at the right lung base. Minimal atelectasis in the right midzone. No effusions. No bone abnormality. IMPRESSION: Bronchitic changes with small areas of atelectasis bilaterally. Electronically Signed   By: Lorriane Shire M.D.   On: 03/05/2019 12:43      Subjective: Feels well.  Abdominal symptoms resolved.  No vomiting.  No fever.  No severe dyspnea or fatigue with exertion.  Discharge Exam: Vitals:   03/09/19 0420 03/09/19 0744  BP: (!) 151/74 137/77  Pulse: (!) 48   Resp:  18  Temp: 97.8 F (36.6 C) 98 F (36.7 C)  SpO2: 97% 95%   Vitals:   03/08/19 1548 03/08/19 1925 03/09/19 0420 03/09/19 0744  BP: (!) 129/59 135/85 (!) 151/74 137/77  Pulse: 81 80 (!) 48   Resp: 19   18  Temp: 98.3 F (36.8 C) 98.4 F (36.9 C) 97.8 F (36.6 C) 98 F (36.7 C)  TempSrc: Oral Oral Oral Oral  SpO2: 97% 97% 97% 95%  Weight:      Height:        General: Pt is alert, awake, not in acute distress Cardiovascular: RRR, nl S1-S2, no murmurs appreciated.   No LE edema.   Respiratory: Normal  respiratory rate and rhythm.  CTAB without rales or wheezes. Abdominal: Abdomen soft and non-tender.  No distension or HSM.   Neuro/Psych: Strength symmetric in upper and lower extremities.  Judgment and insight appear normal.   The results of significant diagnostics from this hospitalization (including imaging, microbiology, ancillary and laboratory) are listed below for reference.     Microbiology: Recent Results (from the past 240 hour(s))  Blood culture (routine x 2)     Status: None (Preliminary result)   Collection Time: 03/05/19 10:03 AM   Specimen: BLOOD  Result Value Ref Range Status   Specimen Description BLOOD RIGHT ANTECUBITAL  Final   Special Requests   Final    BOTTLES DRAWN AEROBIC AND ANAEROBIC Blood Culture adequate volume   Culture   Final    NO GROWTH 4 DAYS Performed  at [facility], [address] [place], [place] [address]    Report Status PENDING  Incomplete  Blood culture (routine x 2)     Status: None (Preliminary result)   Collection Time: 03/05/19 10:15 AM   Specimen: BLOOD  Result Value Ref Range Status   Specimen Description BLOOD BLOOD RIGHT HAND  Final   Special Requests   Final    BOTTLES DRAWN AEROBIC ONLY Blood Culture results may not be optimal due to an inadequate volume of blood received in culture bottles   Culture   Final    NO GROWTH 4 DAYS Performed at [facility], [address], [place],  [address]    Report Status PENDING  Incomplete  SARS Coronavirus 2 [facility] order, Performed in [facility] hospital lab) Nasopharyngeal Nasopharyngeal Swab     Status: Abnormal   Collection Time: 03/05/19 10:26 AM   Specimen: Nasopharyngeal Swab  Result Value Ref Range Status   SARS Coronavirus 2 POSITIVE (A) NEGATIVE Final    Comment: RESULT CALLED TO, READ BACK BY AND VERIFIED WITH: BRIANNA CHATMON AT [id number] 03/05/2019 SDR (NOTE) If result is NEGATIVE SARS-CoV-2 target nucleic acids are NOT DETECTED. The SARS-CoV-2  RNA is generally detectable in upper and lower  respiratory specimens during the acute phase of infection. The lowest  concentration of SARS-CoV-2 viral copies this assay can detect is 250  copies / mL. A negative result does not preclude SARS-CoV-2 infection  and should not be used as the sole basis for treatment or other  patient management decisions.  A negative result may occur with  improper specimen collection / handling, submission of specimen other  than nasopharyngeal swab, presence of viral mutation(s) within the  areas targeted by this assay, and inadequate number of viral copies  (<250 copies / mL). A negative result must be combined with clinical  observations, patient history, and epidemiological information. If result is POSITIVE SARS-CoV-2 target nucleic acids are DETECTED.  The SARS-CoV-2 RNA is generally detectable in upper and lower  respiratory specimens during the acute phase of infection.  Positive  results are indicative of active infection with SARS-CoV-2.  Clinical  correlation with patient history and other diagnostic information is  necessary to determine patient infection status.  Positive results do  not rule out bacterial infection or co-infection with other viruses. If result is PRESUMPTIVE POSTIVE SARS-CoV-2 nucleic acids MAY BE PRESENT.   A presumptive positive result was obtained on the submitted specimen  and confirmed on repeat testing.  While 2019 novel coronavirus  (SARS-CoV-2) nucleic acids may be present in the submitted sample  additional confirmatory testing may be necessary for epidemiological  and / or clinical management purposes  to differentiate between  SARS-CoV-2 and other Sarbecovirus currently known to infect humans.  If clinically indicated additional testing with an alternate test  methodology [phone])  is advised. The SARS-CoV-2 RNA is generally  detectable in upper and lower respiratory specimens during the acute  phase of  infection. The expected result is Negative. Fact Sheet for Patients:  StrictlyIdeas.no Fact Sheet for Healthcare Providers: BankingDealers.co.za This test is not yet approved or cleared by the [place] FDA and has been authorized for detection and/or diagnosis of SARS-CoV-2 by FDA under an Emergency Use Authorization (EUA).  This EUA will remain in effect (meaning this test can be used) for the duration of the COVID-19 declaration under Section 564(b)(1) of the Act, 21 U.S.C. section 360bbb-3(b)(1), unless the authorization is terminated or revoked sooner.  Performed at [facility], [address] [place], [place] [address]      Labs: BNP (last 3 results) No results for input(s): BNP in the last 8760 hours. Basic Metabolic Panel: Recent Labs  Lab 03/05/19 0655 03/06/19 0156 03/07/19 0234 03/08/19 0355 03/09/19 0320  NA 138 141 142 141 140  K 3.6 3.9 3.9 4.0 4.5  CL 102 108 109 107 106  CO2 26 24 25 27 27   GLUCOSE 115* 131* 170* 144* 167*  BUN 12 12 17  22* 20  CREATININE 0.77 0.79 0.64 0.83 0.62  CALCIUM 8.9 8.4* 8.7* 8.7* 8.5*  MG  --  2.1  --   --   --    Liver Function Tests: Recent Labs  Lab 03/05/19 0655 03/06/19 0156 03/07/19 0234 03/08/19 0355 03/09/19 0320  AST 80* 49* 31 24 43*  ALT 83* 69* 59* 48* 62*  ALKPHOS 94 91 80 78 80  BILITOT 0.9 0.6 0.7 0.5 0.5  PROT 6.9 6.2* 6.6 6.5 6.1*  ALBUMIN 3.5 2.9* 3.1* 3.0* 2.9*   Recent Labs  Lab 03/03/19 1533 03/05/19 0655  LIPASE 20 22   No results for input(s): AMMONIA in the last 168 hours. CBC: Recent Labs  Lab 03/05/19 0655 03/06/19 0156 03/07/19 0234 03/08/19 0355 03/09/19 0320  WBC 5.6 4.5 3.4* 5.3 5.9  NEUTROABS 4.3 3.9 2.6 4.1 4.5  HGB 13.1 11.5* 11.9* 11.8* 11.6*  HCT 39.4 35.6* 36.3 36.5 35.3*  MCV 80.1 82.6 81.2 82.6 82.1  PLT 184 173 198 283 250   Cardiac Enzymes: No results for input(s): CKTOTAL, CKMB, CKMBINDEX,  TROPONINI in the last 168 hours. BNP: Invalid input(s): POCBNP CBG: No results for input(s): GLUCAP in the last 168 hours. D-Dimer No results for input(s): DDIMER in the last 72 hours. Hgb A1c No results for input(s): HGBA1C in the last 72 hours. Lipid Profile No results for input(s): CHOL, HDL, LDLCALC, TRIG, CHOLHDL, LDLDIRECT in the last 72 hours. Thyroid function studies No results for input(s): TSH, T4TOTAL, T3FREE, THYROIDAB in the last 72 hours.  Invalid input(s): FREET3 Anemia work up No results for input(s): VITAMINB12, FOLATE, FERRITIN, TIBC, IRON, RETICCTPCT in the last 72 hours. Urinalysis    Component Value Date/Time   COLORURINE YELLOW (A) 03/05/2019 1113   APPEARANCEUR CLEAR (A) 03/05/2019 1113   LABSPEC 1.016 03/05/2019 1113   PHURINE 6.0 03/05/2019 1113   GLUCOSEU NEGATIVE 03/05/2019 1113   GLUCOSEU NEGATIVE 06/22/2014 0902   HGBUR NEGATIVE 03/05/2019 1113   BILIRUBINUR NEGATIVE 03/05/2019 1113   KETONESUR 5 (A) 03/05/2019 1113   PROTEINUR NEGATIVE 03/05/2019 1113   UROBILINOGEN 0.2 06/22/2014 0902   NITRITE NEGATIVE 03/05/2019 1113   LEUKOCYTESUR NEGATIVE 03/05/2019 1113   Sepsis Labs Invalid input(s): PROCALCITONIN,  WBC,  LACTICIDVEN Microbiology Recent Results (from the past 240 hour(s))  Blood culture (routine x 2)     Status: None (Preliminary result)   Collection Time: 03/05/19 10:03 AM   Specimen: BLOOD  Result Value Ref Range Status   Specimen Description BLOOD RIGHT ANTECUBITAL  Final   Special Requests   Final    BOTTLES DRAWN AEROBIC AND ANAEROBIC Blood Culture adequate volume   Culture   Final    NO GROWTH 4 DAYS Performed at [facility], [address] [place],  [address]    Report Status PENDING  Incomplete  Blood culture (routine x 2)     Status: None (Preliminary result)   Collection Time: 03/05/19 10:15 AM   Specimen: BLOOD  Result Value Ref Range Status   Specimen Description BLOOD BLOOD RIGHT HAND  Final    Special Requests   Final    BOTTLES DRAWN AEROBIC ONLY Blood Culture results may not be optimal due to an inadequate volume of blood received in culture bottles   Culture   Final    NO GROWTH 4 DAYS Performed at [facility], [address] [place], [place] [address]    Report Status PENDING  Incomplete  SARS Coronavirus 2 [facility] order, Performed in [facility] hospital lab) Nasopharyngeal Nasopharyngeal Swab     Status: Abnormal   Collection Time: 03/05/19 10:26 AM   Specimen: Nasopharyngeal Swab  Result Value Ref Range Status   SARS Coronavirus 2 POSITIVE (A) NEGATIVE Final    Comment: RESULT CALLED TO, READ BACK BY AND VERIFIED WITH: BRIANNA CHATMON AT [id number] 03/05/2019 SDR (NOTE) If result is NEGATIVE SARS-CoV-2 target nucleic acids are NOT DETECTED. The SARS-CoV-2 RNA is generally detectable in upper and lower  respiratory specimens during the acute phase of infection. The lowest  concentration of SARS-CoV-2 viral copies this assay can detect is 250  copies / mL. A negative result does not preclude SARS-CoV-2 infection  and should not be used as the sole basis for treatment or other  patient management decisions.  A negative result may occur with  improper specimen collection / handling, submission of specimen other  than nasopharyngeal swab, presence of viral mutation(s) within the  areas targeted by this assay, and inadequate number of viral copies  (<250 copies / mL). A negative result must be combined with clinical  observations, patient history, and epidemiological information. If result is POSITIVE SARS-CoV-2 target nucleic acids are DETECTED.  The SARS-CoV-2 RNA is generally detectable in upper and lower  respiratory specimens during the acute phase of infection.  Positive  results are indicative of active infection with SARS-CoV-2.  Clinical  correlation with patient history and other diagnostic information is  necessary to determine patient infection  status.  Positive results do  not rule out bacterial infection or co-infection with other viruses. If result is PRESUMPTIVE POSTIVE SARS-CoV-2 nucleic acids MAY BE PRESENT.   A presumptive positive result was obtained on the submitted specimen  and confirmed on repeat testing.  While 2019 novel coronavirus  (SARS-CoV-2) nucleic acids may be present in the submitted sample  additional confirmatory testing may be necessary for epidemiological  and / or clinical management purposes  to differentiate between  SARS-CoV-2 and other Sarbecovirus currently known to infect humans.  If clinically indicated additional testing with an alternate test  methodology [phone]  is advised. The SARS-CoV-2 RNA is generally  detectable in upper and lower respiratory specimens during the acute  phase of infection. The expected result is Negative. Fact Sheet for Patients:  StrictlyIdeas.no Fact Sheet for Healthcare Providers: BankingDealers.co.za This test is not yet approved or cleared by the [place] FDA and has been authorized for detection and/or diagnosis of SARS-CoV-2 by FDA under an Emergency Use Authorization (EUA).  This EUA will remain in effect (meaning this test can be used) for the duration of the COVID-19 declaration under Section 564(b)(1) of the Act, 21 U.S.C. section 360bbb-3(b)(1), unless the authorization is terminated or revoked sooner. Performed at [facility], [address] [place], [place] [address]      Time coordinating discharge: 25 minutes     SIGNED:   Edwin Dada, MD  [facility] 03/09/2019, 11:10 AM

## 2019-03-09 NOTE — Progress Notes (Signed)
0700 received bedside report, introduced myself to pt  0800 assessment and vs completed and charted, gave fresh ice water  1000 gave morning meds, ambulated pt in room  1200 IV discontinued, discharge order given to pt, pt verbalized understanding of dc instructions. Pt husband will pick her up at 1300 in a black acura

## 2019-03-10 LAB — CULTURE, BLOOD (ROUTINE X 2)
Culture: NO GROWTH
Culture: NO GROWTH
Special Requests: ADEQUATE

## 2019-03-11 ENCOUNTER — Telehealth: Payer: Self-pay | Admitting: Internal Medicine

## 2019-03-11 ENCOUNTER — Other Ambulatory Visit: Payer: Self-pay | Admitting: *Deleted

## 2019-03-11 ENCOUNTER — Encounter: Payer: Self-pay | Admitting: *Deleted

## 2019-03-11 NOTE — Telephone Encounter (Signed)
Transition Care Management Follow-up Telephone Call  How have you been since you were released from the hospital?" I am doing fine as far as breathing, just the unrelenting fatigue." Patient is monitoring vitals and pulse ox has ben between 95- 92 ambulating.   Do you understand why you were in the hospital? yes   Do you understand the discharge instrcutions? yes  Items Reviewed:  Medications reviewed: yes  Allergies reviewed: yes  Dietary changes reviewed: yes  Referrals reviewed: yes   Functional Questionnaire:   Activities of Daily Living (ADLs):   She states they are independent in the following: ambulation, bathing and hygiene, feeding, continence, grooming, toileting and dressing States they require assistance with the following: No assistance required.   Any transportation issues/concerns?: no   Any patient concerns? no   Confirmed importance and date/time of follow-up visits scheduled: yes   Confirmed with patient if condition begins to worsen call PCP or go to the ER.  Patient was given the Call-a-Nurse line [phone]: yes

## 2019-03-11 NOTE — Patient Outreach (Signed)
[facility]) Care Management  03/11/2019  LYSA MARCELLO 02/08/1957 [id number]   Transition of care call/case closure   Referral received: 03/08/19 Initial outreach: 03/11/19 Insurance: Home Choice Plan   Subjective: Initial successful telephone call to patient's home number in order to complete transition of care assessment; 2 HIPAA identifiers verified. Explained purpose of call and completed transition of care assessment.  Mrs. Sabey states other than extreme fatigue, she is doing OK. She reports no diarrhea, nausea or vomiting for > 72 hours. Says she has an occasional nonproductive cough. She says she does not think it is necessary for her to participate in the [place] [facility] via My Chart as she has been in frequent contact with the health department and [facility] [employer].   Spouse and 67 year old daughter are assisting with her recovery.  She says her HTN is in good control and denies the need of a referral to one of the [place] chronic disease management programs.  She says she does not have the hospital indemnity She says she uses a [facility] outpatient pharmacy.  She says she understands her quarantine instructions and has no further educational needs related to COVID 19.   Objective:  Mrs. Sistare  was hospitalized at [facility] from 9/11-9/15 for positive COVID 19 infection. She presented with GI symptoms and pneumonia was found incidentally. Comorbidities include: HTN, allergic rhinitis, melanoma, obesity and Vit D deficiency She was discharged to home on 03/09/19 without the need for home health services or DME.   Assessment:  Patient voices good understanding of all discharge instructions.  See transition of care flowsheet for assessment details.   Plan:  Reviewed hospital discharge diagnosis of COVID 19 infection and treatment plan using hospital discharge instructions, assessing medication adherence, reviewing  problems requiring provider notification, and discussing the importance of follow up with her primary care provider. No ongoing care management needs identified so will close case to [facility] Management services and route successful outreach letter with [place] Management pamphlet and 24 Hour Nurse Line Magnet to [place] Management clinical pool to be mailed to patient's home address.   Barrington Ellison RN,CCM,CDE [place] Management Coordinator Office Phone [phone] Office Fax [phone]

## 2019-03-16 ENCOUNTER — Encounter (INDEPENDENT_AMBULATORY_CARE_PROVIDER_SITE_OTHER): Payer: Self-pay

## 2019-03-17 ENCOUNTER — Encounter: Payer: Self-pay | Admitting: Internal Medicine

## 2019-03-17 ENCOUNTER — Ambulatory Visit (INDEPENDENT_AMBULATORY_CARE_PROVIDER_SITE_OTHER): Payer: 59 | Admitting: Internal Medicine

## 2019-03-17 ENCOUNTER — Other Ambulatory Visit: Payer: Self-pay

## 2019-03-17 VITALS — Ht 63.5 in | Wt 211.0 lb

## 2019-03-17 DIAGNOSIS — R748 Abnormal levels of other serum enzymes: Secondary | ICD-10-CM

## 2019-03-17 DIAGNOSIS — R7401 Elevation of levels of liver transaminase levels: Secondary | ICD-10-CM

## 2019-03-17 DIAGNOSIS — Z09 Encounter for follow-up examination after completed treatment for conditions other than malignant neoplasm: Secondary | ICD-10-CM | POA: Diagnosis not present

## 2019-03-17 DIAGNOSIS — J1289 Other viral pneumonia: Secondary | ICD-10-CM | POA: Diagnosis not present

## 2019-03-17 DIAGNOSIS — U071 COVID-19: Secondary | ICD-10-CM

## 2019-03-17 DIAGNOSIS — R740 Nonspecific elevation of levels of transaminase and lactic acid dehydrogenase [LDH]: Secondary | ICD-10-CM

## 2019-03-17 DIAGNOSIS — J1282 Pneumonia due to coronavirus disease 2019: Secondary | ICD-10-CM

## 2019-03-17 NOTE — Assessment & Plan Note (Signed)
She is recovering from her illness that required hospitalization and will be out of isolation on Sept 26.  Will return to work on Sept 28th at reduced work (1/2 days) for 2 weeks.

## 2019-03-17 NOTE — Assessment & Plan Note (Signed)
Acute , during hospitalization.  Needs repeat lfts on Sept 29/30

## 2019-03-17 NOTE — Progress Notes (Signed)
Virtual Visit via [email or web address]  This visit type was conducted due to national recommendations for restrictions regarding the COVID-19 pandemic (e.g. social distancing).  This format is felt to be most appropriate for this patient at this time.  All issues noted in this document were discussed and addressed.  No physical exam was performed (except for noted visual exam findings with Video Visits).   I connected with@ on 03/17/19 at 11:00 AM EDT by a video enabled telemedicine application  and verified that I am speaking with the correct person using two identifiers. Location patient: home Location provider: work or home office Persons participating in the virtual visit: patient, provider  I discussed the limitations, risks, security and privacy concerns of performing an evaluation and management service by telephone and the availability of in person appointments. I also discussed with the patient that there may be a patient responsible charge related to this service. The patient expressed understanding and agreed to proceed.   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  She was evaluated with head CT and sent home from ER without COVID TESTING.  She returned to ER on Sept 11 with persistent symptoms and hypoxia with LLL PNA noted and tested for COVID 19.  ADMITTED TRANSFERRED TO [facility] for treatment with Remdesivir and Decadron.  Received 5 doses and sent home on Sept 15.  pO2 SATs at home have been 95 to 99%  ,  All symptoms improving including the fatigue  In isolation until Sept 26.  Husband Jenny Reichmann is presumed to be "patient zero" since he attended a [employer] and developed fevers  and chills several days later but tested negative.  Daughter Abby tested positive and has recovered uneventfully.      ROS: See pertinent positives and negatives per HPI.  Past Medical History:  Diagnosis Date  . Hypertension   . melanoma 2006   lower back  . Previous pregnancy with hemolysis, elevated liver enzymes, and low platelet (HELLP) syndrome, antepartum 2002   s/p c section     Past Surgical History:  Procedure Laterality Date  . BREAST BIOPSY Right 1998 and 2005   benign  . CESAREAN SECTION  2002    Family History  Problem Relation Age of Onset  . Cancer Mother        Breast and Lung Ca, social smoker   . Hypertension Mother   . Breast cancer Mother 4  . Cancer Father        Lung Ca, smoker  . Stroke Paternal Grandfather 42       CVA ,    . Deep vein thrombosis Maternal Grandmother     SOCIAL HX:  reports that she has never smoked. She has never used smokeless tobacco. She reports that she does not drink alcohol or use drugs.   Current Outpatient Medications:  .  cholecalciferol (VITAMIN D) 1000 units tablet, Take 1,000 Units by mouth daily., Disp: , Rfl:  .  losartan (COZAAR) 50 MG tablet, TAKE 1 TABLET BY MOUTH DAILY. (Patient taking differently: Take 50 mg by mouth daily. ), Disp: 90 tablet, Rfl: 1 .  metoprolol succinate (TOPROL-XL) 25 MG 24 hr  tablet, TAKE 1 TABLET (25 MG TOTAL) BY MOUTH DAILY. (Patient taking differently: Take 25 mg by mouth at bedtime. ), Disp: 90 tablet, Rfl: 1 .  meclizine (ANTIVERT) 25 MG tablet, Take 1 tablet (25 mg total) by mouth 3 (three) times daily as needed for dizziness. (Patient not taking: Reported on 03/11/2019), Disp: 30 tablet, Rfl: 0  EXAM:  VITALS per patient if applicable:  GENERAL: alert, oriented, appears well and in no acute distress  HEENT: atraumatic, conjunttiva clear, no obvious abnormalities on inspection of external nose and ears  NECK: normal movements of the head and neck  LUNGS: on inspection no  signs of respiratory distress, breathing rate appears normal, no obvious gross SOB, gasping or wheezing  CV: no obvious cyanosis  MS: moves all visible extremities without noticeable abnormality  PSYCH/NEURO: pleasant and cooperative, no obvious depression or anxiety, speech and thought processing grossly intact  ASSESSMENT AND PLAN:  Discussed the following assessment and plan:  Elevated liver enzymes - Plan: Comprehensive metabolic panel  Pneumonia due to COVID-19 virus  Transaminitis  Hospital discharge follow-up  Pneumonia due to COVID-19 virus She is recovering from her illness that required hospitalization and will be out of isolation on Sept 26.  Will return to work on Sept 28th at reduced work (1/2 days) for 2 weeks.    Transaminitis Acute , during hospitalization.  Needs repeat lfts on Sept 29/30  Hospital discharge follow-up Patient is stable post discharge and has no new issues or questions about her discharge plans    I discussed the assessment and treatment plan with the patient. The patient was provided an opportunity to ask questions and all were answered. The patient agreed with the plan and demonstrated an understanding of the instructions.   The patient was advised to call back or seek an in-person evaluation if the symptoms worsen or if the condition fails to improve as anticipated.  I provided  25 minutes of non-face-to-face time during this encounter reviewing patient's current problems and post surgeries.  Providing counseling on the above mentioned problems , and coordination  of care . Crecencio Mc, MD

## 2019-03-17 NOTE — Assessment & Plan Note (Addendum)
Patient is stable post discharge on Sept 15 for acute gastroenteritis and respiratory failure secondary to [place]. and has no new issues or questions about her discharge plans

## 2019-03-18 ENCOUNTER — Encounter (INDEPENDENT_AMBULATORY_CARE_PROVIDER_SITE_OTHER): Payer: Self-pay

## 2019-03-18 ENCOUNTER — Telehealth: Payer: Self-pay | Admitting: Internal Medicine

## 2019-03-18 DIAGNOSIS — Z0279 Encounter for issue of other medical certificate: Secondary | ICD-10-CM

## 2019-03-18 NOTE — Telephone Encounter (Signed)
FMLA for recent hospitalization for COVID 19 INFECTIO Ovando SIGNED NAD RETUREND TO JESSICA IN RED FOLDER

## 2019-03-18 NOTE — Telephone Encounter (Signed)
Paper work has been faxed

## 2019-03-24 ENCOUNTER — Other Ambulatory Visit (INDEPENDENT_AMBULATORY_CARE_PROVIDER_SITE_OTHER): Payer: 59

## 2019-03-24 ENCOUNTER — Other Ambulatory Visit: Payer: Self-pay | Admitting: Internal Medicine

## 2019-03-24 ENCOUNTER — Other Ambulatory Visit: Payer: Self-pay

## 2019-03-24 DIAGNOSIS — R748 Abnormal levels of other serum enzymes: Secondary | ICD-10-CM

## 2019-03-24 DIAGNOSIS — R7401 Elevation of levels of liver transaminase levels: Secondary | ICD-10-CM

## 2019-03-24 LAB — COMPREHENSIVE METABOLIC PANEL
ALT: 70 U/L — ABNORMAL HIGH (ref 0–35)
AST: 41 U/L — ABNORMAL HIGH (ref 0–37)
Albumin: 4 g/dL (ref 3.5–5.2)
Alkaline Phosphatase: 106 U/L (ref 39–117)
BUN: 11 mg/dL (ref 6–23)
CO2: 24 mEq/L (ref 19–32)
Calcium: 9.9 mg/dL (ref 8.4–10.5)
Chloride: 107 mEq/L (ref 96–112)
Creatinine, Ser: 0.78 mg/dL (ref 0.40–1.20)
GFR: 76.33 mL/min (ref >60.00)
Glucose, Bld: 90 mg/dL (ref 70–99)
Potassium: 4.1 mEq/L (ref 3.5–5.1)
Sodium: 140 mEq/L (ref 135–145)
Total Bilirubin: 0.6 mg/dL (ref 0.2–1.2)
Total Protein: 7.3 g/dL (ref 6.0–8.3)

## 2019-03-31 ENCOUNTER — Other Ambulatory Visit: Payer: Self-pay

## 2019-03-31 ENCOUNTER — Other Ambulatory Visit (INDEPENDENT_AMBULATORY_CARE_PROVIDER_SITE_OTHER): Payer: 59

## 2019-03-31 DIAGNOSIS — R7401 Elevation of levels of liver transaminase levels: Secondary | ICD-10-CM

## 2019-03-31 LAB — HEPATIC FUNCTION PANEL
ALT: 47 U/L — ABNORMAL HIGH (ref 0–35)
AST: 27 U/L (ref 0–37)
Albumin: 4.1 g/dL (ref 3.5–5.2)
Alkaline Phosphatase: 112 U/L (ref 39–117)
Bilirubin, Direct: 0.1 mg/dL (ref 0.0–0.3)
Total Bilirubin: 0.6 mg/dL (ref 0.2–1.2)
Total Protein: 6.9 g/dL (ref 6.0–8.3)

## 2019-04-01 ENCOUNTER — Other Ambulatory Visit: Payer: Self-pay | Admitting: Internal Medicine

## 2019-04-01 DIAGNOSIS — R7401 Elevation of levels of liver transaminase levels: Secondary | ICD-10-CM

## 2019-04-16 ENCOUNTER — Other Ambulatory Visit: Payer: Self-pay

## 2019-04-16 ENCOUNTER — Other Ambulatory Visit (INDEPENDENT_AMBULATORY_CARE_PROVIDER_SITE_OTHER): Payer: 59

## 2019-04-16 DIAGNOSIS — R7401 Elevation of levels of liver transaminase levels: Secondary | ICD-10-CM

## 2019-04-16 LAB — HEPATIC FUNCTION PANEL
ALT: 33 U/L (ref 0–35)
AST: 23 U/L (ref 0–37)
Albumin: 4.2 g/dL (ref 3.5–5.2)
Alkaline Phosphatase: 95 U/L (ref 39–117)
Bilirubin, Direct: 0.1 mg/dL (ref 0.0–0.3)
Total Bilirubin: 0.6 mg/dL (ref 0.2–1.2)
Total Protein: 7.2 g/dL (ref 6.0–8.3)

## 2019-06-28 DIAGNOSIS — C4359 Malignant melanoma of other part of trunk: Secondary | ICD-10-CM | POA: Diagnosis not present

## 2019-06-28 DIAGNOSIS — L718 Other rosacea: Secondary | ICD-10-CM | POA: Diagnosis not present

## 2019-06-28 DIAGNOSIS — L905 Scar conditions and fibrosis of skin: Secondary | ICD-10-CM | POA: Diagnosis not present

## 2019-06-28 DIAGNOSIS — L650 Telogen effluvium: Secondary | ICD-10-CM | POA: Diagnosis not present

## 2019-06-28 DIAGNOSIS — D1801 Hemangioma of skin and subcutaneous tissue: Secondary | ICD-10-CM | POA: Diagnosis not present

## 2019-06-28 DIAGNOSIS — L821 Other seborrheic keratosis: Secondary | ICD-10-CM | POA: Diagnosis not present

## 2019-06-30 ENCOUNTER — Other Ambulatory Visit: Payer: Self-pay | Admitting: Internal Medicine

## 2019-06-30 DIAGNOSIS — Z1231 Encounter for screening mammogram for malignant neoplasm of breast: Secondary | ICD-10-CM

## 2019-07-26 ENCOUNTER — Other Ambulatory Visit: Payer: Self-pay | Admitting: Internal Medicine

## 2019-07-28 ENCOUNTER — Ambulatory Visit
Admission: RE | Admit: 2019-07-28 | Discharge: 2019-07-28 | Disposition: A | Discharge status: Home / Self Care | Payer: 59 | Source: Ambulatory Visit | Attending: Internal Medicine | Admitting: Internal Medicine

## 2019-07-28 DIAGNOSIS — Z1231 Encounter for screening mammogram for malignant neoplasm of breast: Secondary | ICD-10-CM | POA: Diagnosis not present

## 2019-10-13 ENCOUNTER — Other Ambulatory Visit: Payer: Self-pay | Admitting: Internal Medicine

## 2019-10-19 ENCOUNTER — Other Ambulatory Visit: Payer: Self-pay

## 2019-10-21 ENCOUNTER — Encounter: Payer: Self-pay | Admitting: Internal Medicine

## 2019-10-21 ENCOUNTER — Other Ambulatory Visit: Payer: Self-pay

## 2019-10-21 ENCOUNTER — Ambulatory Visit (INDEPENDENT_AMBULATORY_CARE_PROVIDER_SITE_OTHER): Payer: 59 | Admitting: Internal Medicine

## 2019-10-21 VITALS — BP 128/82 | HR 98 | Temp 97.5°F | Resp 15 | Ht 63.5 in | Wt 211.2 lb

## 2019-10-21 DIAGNOSIS — Z9889 Other specified postprocedural states: Secondary | ICD-10-CM

## 2019-10-21 DIAGNOSIS — K8020 Calculus of gallbladder without cholecystitis without obstruction: Secondary | ICD-10-CM

## 2019-10-21 DIAGNOSIS — Z0000 Encounter for general adult medical examination without abnormal findings: Secondary | ICD-10-CM

## 2019-10-21 DIAGNOSIS — N281 Cyst of kidney, acquired: Secondary | ICD-10-CM

## 2019-10-21 DIAGNOSIS — C801 Malignant (primary) neoplasm, unspecified: Secondary | ICD-10-CM | POA: Diagnosis not present

## 2019-10-21 DIAGNOSIS — E559 Vitamin D deficiency, unspecified: Secondary | ICD-10-CM | POA: Diagnosis not present

## 2019-10-21 DIAGNOSIS — R5383 Other fatigue: Secondary | ICD-10-CM

## 2019-10-21 DIAGNOSIS — I10 Essential (primary) hypertension: Secondary | ICD-10-CM

## 2019-10-21 DIAGNOSIS — Z85820 Personal history of malignant melanoma of skin: Secondary | ICD-10-CM

## 2019-10-21 DIAGNOSIS — E785 Hyperlipidemia, unspecified: Secondary | ICD-10-CM

## 2019-10-21 DIAGNOSIS — U071 COVID-19: Secondary | ICD-10-CM | POA: Diagnosis not present

## 2019-10-21 DIAGNOSIS — E669 Obesity, unspecified: Secondary | ICD-10-CM

## 2019-10-21 DIAGNOSIS — J22 Unspecified acute lower respiratory infection: Secondary | ICD-10-CM

## 2019-10-21 LAB — COMPREHENSIVE METABOLIC PANEL
ALT: 20 U/L (ref 0–35)
AST: 18 U/L (ref 0–37)
Albumin: 4.6 g/dL (ref 3.5–5.2)
Alkaline Phosphatase: 111 U/L (ref 39–117)
BUN: 13 mg/dL (ref 6–23)
CO2: 29 mEq/L (ref 19–32)
Calcium: 10.3 mg/dL (ref 8.4–10.5)
Chloride: 101 mEq/L (ref 96–112)
Creatinine, Ser: 0.89 mg/dL (ref 0.40–1.20)
GFR: 65.41 mL/min (ref >60.00)
Glucose, Bld: 101 mg/dL — ABNORMAL HIGH (ref 70–99)
Potassium: 4.1 mEq/L (ref 3.5–5.1)
Sodium: 138 mEq/L (ref 135–145)
Total Bilirubin: 0.6 mg/dL (ref 0.2–1.2)
Total Protein: 8.1 g/dL (ref 6.0–8.3)

## 2019-10-21 LAB — CBC WITH DIFFERENTIAL/PLATELET
Basophils Absolute: 0.1 10*3/uL (ref 0.0–0.1)
Basophils Relative: 1 % (ref 0.0–3.0)
Eosinophils Absolute: 0.1 10*3/uL (ref 0.0–0.7)
Eosinophils Relative: 1.1 % (ref 0.0–5.0)
HCT: 41.1 % (ref 36.0–46.0)
Hemoglobin: 14.1 g/dL (ref 12.0–15.0)
Lymphocytes Relative: 24.5 % (ref 12.0–46.0)
Lymphs Abs: 2.1 10*3/uL (ref 0.7–4.0)
MCHC: 34.2 g/dL (ref 30.0–36.0)
MCV: 81.5 fl (ref 78.0–100.0)
Monocytes Absolute: 0.5 10*3/uL (ref 0.1–1.0)
Monocytes Relative: 6.1 % (ref 3.0–12.0)
Neutro Abs: 5.7 10*3/uL (ref 1.4–7.7)
Neutrophils Relative %: 67.3 % (ref 43.0–77.0)
Platelets: 305 10*3/uL (ref 150.0–400.0)
RBC: 5.05 Mil/uL (ref 3.87–5.11)
RDW: 14 % (ref 11.5–15.5)
WBC: 8.5 10*3/uL (ref 4.0–10.5)

## 2019-10-21 LAB — URINALYSIS, ROUTINE W REFLEX MICROSCOPIC
Bilirubin Urine: NEGATIVE
Hgb urine dipstick: NEGATIVE
Ketones, ur: NEGATIVE
Leukocytes,Ua: NEGATIVE
Nitrite: NEGATIVE
RBC / HPF: NONE SEEN (ref 0–?)
Specific Gravity, Urine: 1.02 (ref 1.000–1.030)
Total Protein, Urine: NEGATIVE
Urine Glucose: NEGATIVE
Urobilinogen, UA: 0.2 (ref 0.0–1.0)
pH: 6 (ref 5.0–8.0)

## 2019-10-21 LAB — LIPID PANEL
Cholesterol: 207 mg/dL — ABNORMAL HIGH (ref 0–200)
HDL: 59.8 mg/dL (ref >39.00)
LDL Cholesterol: 127 mg/dL — ABNORMAL HIGH (ref 0–99)
NonHDL: 146.72
Total CHOL/HDL Ratio: 3
Triglycerides: 99 mg/dL (ref 0.0–149.0)
VLDL: 19.8 mg/dL (ref 0.0–40.0)

## 2019-10-21 LAB — VITAMIN D 25 HYDROXY (VIT D DEFICIENCY, FRACTURES): VITD: 30.75 ng/mL (ref 30.00–100.00)

## 2019-10-21 LAB — TSH: TSH: 1.62 u[IU]/mL (ref 0.35–4.50)

## 2019-10-21 NOTE — Assessment & Plan Note (Addendum)
Last derm follow up with dawn kleinman in January 2021  No recurrence.

## 2019-10-21 NOTE — Assessment & Plan Note (Signed)
I have addressed  BMI and recommended wt loss of 10% of body weight over the next 6 months using a low glycemic index diet and regular exercise a minimum of 5 days per week.   

## 2019-10-21 NOTE — Patient Instructions (Signed)
Your right upper quadrant pain may be coming from thoracic spine  If it becomes more bothersome   We can obtain plain films   The 2 dose Shingles vaccine can be done (2 months apart) 30 days after the COVID 19 vaccination    Next PAP smear due in 2023   Health Maintenance for Postmenopausal Women Menopause is a normal process in which your ability to get pregnant comes to an end. This process happens slowly over many months or years, usually between the ages of 7 and 65. Menopause is complete when you have missed your menstrual periods for 12 months. It is important to talk with your health care provider about some of the most common conditions that affect women after menopause (postmenopausal women). These include heart disease, cancer, and bone loss (osteoporosis). Adopting a healthy lifestyle and getting preventive care can help to promote your health and wellness. The actions you take can also lower your chances of developing some of these common conditions. What should I know about menopause? During menopause, you may get a number of symptoms, such as:  Hot flashes. These can be moderate or severe.  Night sweats.  Decrease in sex drive.  Mood swings.  Headaches.  Tiredness.  Irritability.  Memory problems.  Insomnia. Choosing to treat or not to treat these symptoms is a decision that you make with your health care provider. Do I need hormone replacement therapy?  Hormone replacement therapy is effective in treating symptoms that are caused by menopause, such as hot flashes and night sweats.  Hormone replacement carries certain risks, especially as you become older. If you are thinking about using estrogen or estrogen with progestin, discuss the benefits and risks with your health care provider. What is my risk for heart disease and stroke? The risk of heart disease, heart attack, and stroke increases as you age. One of the causes may be a change in the body's hormones  during menopause. This can affect how your body uses dietary fats, triglycerides, and cholesterol. Heart attack and stroke are medical emergencies. There are many things that you can do to help prevent heart disease and stroke. Watch your blood pressure  High blood pressure causes heart disease and increases the risk of stroke. This is more likely to develop in people who have high blood pressure readings, are of African descent, or are overweight.  Have your blood pressure checked: ? Every 3-5 years if you are 24-73 years of age. ? Every year if you are 37 years old or older. Eat a healthy diet   Eat a diet that includes plenty of vegetables, fruits, low-fat dairy products, and lean protein.  Do not eat a lot of foods that are high in solid fats, added sugars, or sodium. Get regular exercise Get regular exercise. This is one of the most important things you can do for your health. Most adults should:  Try to exercise for at least 150 minutes each week. The exercise should increase your heart rate and make you sweat (moderate-intensity exercise).  Try to do strengthening exercises at least twice each week. Do these in addition to the moderate-intensity exercise.  Spend less time sitting. Even light physical activity can be beneficial. Other tips  Work with your health care provider to achieve or maintain a healthy weight.  Do not use any products that contain nicotine or tobacco, such as cigarettes, e-cigarettes, and chewing tobacco. If you need help quitting, ask your health care provider.  Know your numbers.  Ask your health care provider to check your cholesterol and your blood sugar (glucose). Continue to have your blood tested as directed by your health care provider. Do I need screening for cancer? Depending on your health history and family history, you may need to have cancer screening at different stages of your life. This may include screening for:  Breast cancer.  Cervical  cancer.  Lung cancer.  Colorectal cancer. What is my risk for osteoporosis? After menopause, you may be at increased risk for osteoporosis. Osteoporosis is a condition in which bone destruction happens more quickly than new bone creation. To help prevent osteoporosis or the bone fractures that can happen because of osteoporosis, you may take the following actions:  If you are 71-77 years old, get at least 1,000 mg of calcium and at least 600 mg of vitamin D per day.  If you are older than age 17 but younger than age 5, get at least 1,200 mg of calcium and at least 600 mg of vitamin D per day.  If you are older than age 5, get at least 1,200 mg of calcium and at least 800 mg of vitamin D per day. Smoking and drinking excessive alcohol increase the risk of osteoporosis. Eat foods that are rich in calcium and vitamin D, and do weight-bearing exercises several times each week as directed by your health care provider. How does menopause affect my mental health? Depression may occur at any age, but it is more common as you become older. Common symptoms of depression include:  Low or sad mood.  Changes in sleep patterns.  Changes in appetite or eating patterns.  Feeling an overall lack of motivation or enjoyment of activities that you previously enjoyed.  Frequent crying spells. Talk with your health care provider if you think that you are experiencing depression. General instructions See your health care provider for regular wellness exams and vaccines. This may include:  Scheduling regular health, dental, and eye exams.  Getting and maintaining your vaccines. These include: ? Influenza vaccine. Get this vaccine each year before the flu season begins. ? Pneumonia vaccine. ? Shingles vaccine. ? Tetanus, diphtheria, and pertussis (Tdap) booster vaccine. Your health care provider may also recommend other immunizations. Tell your health care provider if you have ever been abused or do  not feel safe at home. Summary  Menopause is a normal process in which your ability to get pregnant comes to an end.  This condition causes hot flashes, night sweats, decreased interest in sex, mood swings, headaches, or lack of sleep.  Treatment for this condition may include hormone replacement therapy.  Take actions to keep yourself healthy, including exercising regularly, eating a healthy diet, watching your weight, and checking your blood pressure and blood sugar levels.  Get screened for cancer and depression. Make sure that you are up to date with all your vaccines. This information is not intended to replace advice given to you by your health care provider. Make sure you discuss any questions you have with your health care provider. Document Revised: 06/03/2018 Document Reviewed: 06/03/2018 Elsevier Patient Education  2020 [employer].

## 2019-10-21 NOTE — Assessment & Plan Note (Signed)
Prolonged by avoidance of sun due to history of melanoma excision

## 2019-10-21 NOTE — Progress Notes (Signed)
Patient ID: Kathryn Torres, female    DOB: 04/16/1961  Age: 59 y.o. MRN: [id number]  The patient is here for annual physical examination and management of other chronic and acute problems.  This visit occurred during the SARS-CoV-2 public health emergency.  Safety protocols were in place, including screening questions prior to the visit, additional usage of staff PPE, and extensive cleaning of exam room while observing appropriate contact time as indicated for disinfecting solutions.    Patient has received both doses of the [place] 19 vaccine without complications.  Patient continues to mask when outside of the home except when walking in yard or at safe distances from others .  Patient denies any change in mood or development of unhealthy behaviors resuting from the pandemic's restriction of activities and socialization.     The risk factors are reflected in the social history.  The roster of all physicians providing medical care to patient - is listed in the Snapshot section of the chart.  Activities of daily living:  The patient is 100% independent in all ADLs: dressing, toileting, feeding as well as independent mobility  Home safety : The patient has smoke detectors in the home. They wear seatbelts.  There are no firearms at home. There is no violence in the home.   There is no risks for hepatitis, STDs or HIV. There is no   history of blood transfusion. They have no travel history to infectious disease endemic areas of the world.  The patient has seen their dentist in the last six month. They have seen their eye doctor in the last year.   They do not  have excessive sun exposure. Discussed the need for sun protection: hats, long sleeves and use of sunscreen if there is significant sun exposure.   Diet: the importance of a healthy diet is discussed. They do have a healthy diet.  The benefits of regular aerobic exercise were discussed. She walks 3 times per week ,  20 minutes.    Depression screen: there are no signs or vegative symptoms of depression- irritability, change in appetite, anhedonia, sadness/tearfullness.   The following portions of the patient's history were reviewed and updated as appropriate: allergies, current medications, past family history, past medical history,  past surgical history, past social history  and problem list.  Visual acuity was not assessed per patient preference since she has regular follow up with her ophthalmologist. Hearing and body mass index were assessed and reviewed.   During the course of the visit the patient was educated and counseled about appropriate screening and preventive services including : fall prevention , diabetes screening, nutrition counseling, colorectal cancer screening, and recommended immunizations.    CC: The primary encounter diagnosis was Fatigue, unspecified type. Diagnoses of History of melanoma excision, Lower respiratory tract infection due to COVID-19 virus, Vitamin D deficiency, Hyperlipidemia LDL goal <130, Renal cyst, acquired, left, Cancer ([place]), Obesity (BMI 30-39.9), Symptomatic cholelithiasis, Visit for preventive health examination, and Essential hypertension were also pertinent to this visit.  1) still fatigued post COVID 19 INFECTION requiring hospitalization in Sept 2020   2) continues to have LUQ pain , mil, d brought on by lying flat in bed at night.  Lasts about 15 min, no nausea, early satiety,  History Kathryn Torres has a past medical history of Hypertension, Lower respiratory tract infection due to COVID-19 virus (03/05/2019), melanoma (2006), and Previous pregnancy with hemolysis, elevated liver enzymes, and low platelet (HELLP) syndrome, antepartum (2002).   She has a  past surgical history that includes Cesarean section (2002) and Breast biopsy (Right, 1998 and 2005).   Her family history includes Breast cancer (age of onset: 16) in her mother; Cancer in her father and mother; Deep vein  thrombosis in her maternal grandmother; Hypertension in her mother; Stroke (age of onset: 58) in her paternal grandfather.She reports that she has never smoked. She has never used smokeless tobacco. She reports that she does not drink alcohol or use drugs.  Outpatient Medications Prior to Visit  Medication Sig Dispense Refill  . cholecalciferol (VITAMIN D) 1000 units tablet Take 1,000 Units by mouth daily.    Marland Kitchen losartan (COZAAR) 50 MG tablet TAKE 1 TABLET BY MOUTH DAILY. 90 tablet 1  . metoprolol succinate (TOPROL-XL) 25 MG 24 hr tablet TAKE 1 TABLET BY MOUTH DAILY 90 tablet 1  . meclizine (ANTIVERT) 25 MG tablet Take 1 tablet (25 mg total) by mouth 3 (three) times daily as needed for dizziness. (Patient not taking: Reported on 03/11/2019) 30 tablet 0   No facility-administered medications prior to visit.    Review of Systems   Patient denies headache, fevers, malaise, unintentional weight loss, skin rash, eye pain, sinus congestion and sinus pain, sore throat, dysphagia,  hemoptysis , cough, dyspnea, wheezing, chest pain, palpitations, orthopnea, edema, abdominal pain, nausea, melena, diarrhea, constipation, flank pain, dysuria, hematuria, urinary  Frequency, nocturia, numbness, tingling, seizures,  Focal weakness, Loss of consciousness,  Tremor, insomnia, depression, anxiety, and suicidal ideation.      Objective:  BP 128/82 (BP Location: Left Arm, Patient Position: Sitting, Cuff Size: Large)   Pulse 98   Temp (!) 97.5 F (36.4 C) (Temporal)   Resp 15   Ht 5' 3.5" (1.613 m)   Wt 211 lb 3.2 oz (95.8 kg)   SpO2 99%   BMI 36.83 kg/m   Physical Exam  General appearance: alert, cooperative and appears stated age Head: Normocephalic, without obvious abnormality, atraumatic Eyes: conjunctivae/corneas clear. PERRL, EOM's intact. Fundi benign. Ears: normal TM's and external ear canals both ears Nose: Nares normal. Septum midline. Mucosa normal. No drainage or sinus tenderness. Throat:  lips, mucosa, and tongue normal; teeth and gums normal Neck: no adenopathy, no carotid bruit, no JVD, supple, symmetrical, trachea midline and thyroid not enlarged, symmetric, no tenderness/mass/nodules Lungs: clear to auscultation bilaterally Breasts: normal appearance, no masses or tenderness Heart: regular rate and rhythm, S1, S2 normal, no murmur, click, rub or gallop Abdomen: soft, non-tenr to deep palpation in LUQ; bowel sounds normal; no masses,  no organomegaly Extremities: extremities normal, atraumatic, no cyanosis or edema Pulses: 2+ and symmetric Skin: Skin color, texture, turgor normal. No rashes or lesions Neurologic: Alert and oriented X 3, normal strength and tone. Normal symmetric reflexes. Normal coordination and gait.    Assessment & Plan:   Problem List Items Addressed This Visit      Unprioritized   RESOLVED: Cancer ([place])   History of melanoma excision    Last derm follow up with dawn kleinman in January 2021  No recurrence.       Hypertension    Well controlled on current regimen. Renal function stable, no changes today.  Lab Results  Component Value Date   CREATININE 0.89 10/21/2019   Lab Results  Component Value Date   NA 138 10/21/2019   K 4.1 10/21/2019   CL 101 10/21/2019   CO2 29 10/21/2019         RESOLVED: Lower respiratory tract infection due to COVID-19 virus  Hospitalized with N/VD in September,  RLL pneumonia,  COVID 19 positive,  Given remdesivir , high dose steroids ,  48 hours improved,  Was discharged after 5 days       Obesity (BMI 30-39.9) (Chronic)    I have addressed  BMI and recommended wt loss of 10% of body weight over the next 6 months using a low glycemic index diet and regular exercise a minimum of 5 days per week.        Renal cyst, acquired, left    Incidental finding on prior CT.  Checking urinalysis for hematuria; negative         Relevant Orders   Urinalysis, Routine w reflex microscopic (Completed)    Symptomatic cholelithiasis    No symptoms in months  Avoiding fatty foods.       Visit for preventive health examination    age appropriate education and counseling updated, referrals for preventative services and immunizations addressed, dietary and smoking counseling addressed, most recent labs reviewed.  I have personally reviewed and have noted:  1) the patient's medical and social history 2) The pt's use of alcohol, tobacco, and illicit drugs 3) The patient's current medications and supplements 4) Functional ability including ADL's, fall risk, home safety risk, hearing and visual impairment 5) Diet and physical activities 6) Evidence for depression or mood disorder 7) The patient's height, weight, and BMI have been recorded in the chart  I have made referrals, and provided counseling and education based on review of the above      Vitamin D deficiency    Prolonged by avoidance of sun due to history of melanoma excision       Relevant Orders   VITAMIN D 25 Hydroxy (Vit-D Deficiency, Fractures) (Completed)    Other Visit Diagnoses    Fatigue, unspecified type    -  Primary   Relevant Orders   Comprehensive metabolic panel (Completed)   TSH (Completed)   CBC with Differential/Platelet (Completed)   Hyperlipidemia LDL goal <130       Relevant Orders   Lipid panel (Completed)      I am having Kathryn Torres maintain her cholecalciferol, meclizine, losartan, and metoprolol succinate.  No orders of the defined types were placed in this encounter.   There are no discontinued medications.  Follow-up: No follow-ups on file.   Crecencio Mc, MD

## 2019-10-21 NOTE — Assessment & Plan Note (Signed)

## 2019-10-21 NOTE — Assessment & Plan Note (Addendum)
Hospitalized with N/VD in September,  RLL pneumonia,  COVID 19 positive,  Given remdesivir , high dose steroids ,  48 hours improved,  Was discharged after 5 days

## 2019-10-21 NOTE — Assessment & Plan Note (Signed)
No symptoms in months  Avoiding fatty foods.

## 2019-10-23 ENCOUNTER — Encounter: Payer: Self-pay | Admitting: Internal Medicine

## 2019-10-23 DIAGNOSIS — N281 Cyst of kidney, acquired: Secondary | ICD-10-CM | POA: Insufficient documentation

## 2019-10-23 NOTE — Assessment & Plan Note (Signed)
Well controlled on current regimen. Renal function stable, no changes today.  Lab Results  Component Value Date   CREATININE 0.89 10/21/2019   Lab Results  Component Value Date   NA 138 10/21/2019   K 4.1 10/21/2019   CL 101 10/21/2019   CO2 29 10/21/2019

## 2019-10-23 NOTE — Assessment & Plan Note (Addendum)
Incidental finding on prior CT.  Checking urinalysis for hematuria; negative

## 2020-01-03 DIAGNOSIS — H52203 Unspecified astigmatism, bilateral: Secondary | ICD-10-CM | POA: Diagnosis not present

## 2020-01-03 DIAGNOSIS — L821 Other seborrheic keratosis: Secondary | ICD-10-CM | POA: Diagnosis not present

## 2020-01-03 DIAGNOSIS — L718 Other rosacea: Secondary | ICD-10-CM | POA: Diagnosis not present

## 2020-01-03 DIAGNOSIS — C4359 Malignant melanoma of other part of trunk: Secondary | ICD-10-CM | POA: Diagnosis not present

## 2020-02-07 ENCOUNTER — Other Ambulatory Visit: Payer: Self-pay | Admitting: Internal Medicine

## 2020-05-09 IMAGING — MG DIGITAL SCREENING BILAT W/ TOMO W/ CAD
8 series · 8 of 24 positions shown · non-contrast
Comparison: Previous exam(s).

CLINICAL DATA: Screening.

EXAM:
DIGITAL SCREENING BILATERAL MAMMOGRAM WITH TOMO AND CAD

[R CC synth-2D]
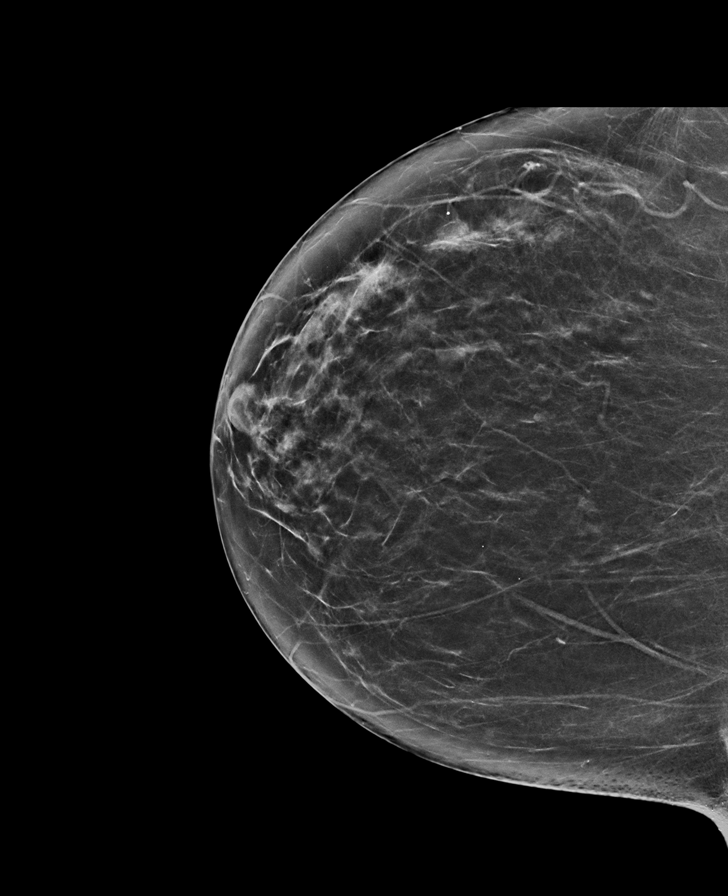

[L MLO synth-2D]
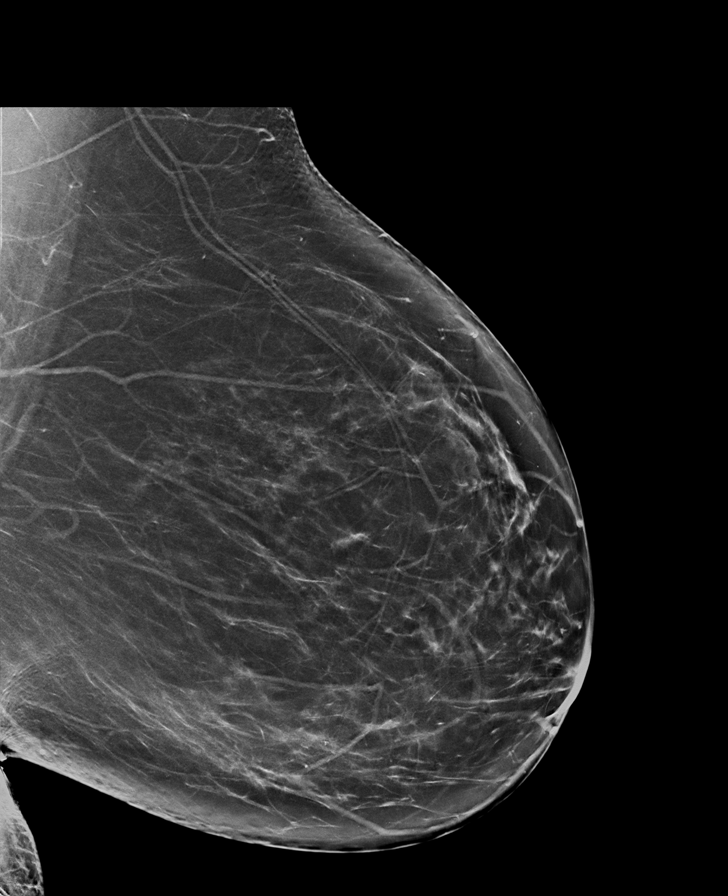

[L CC synth-2D]
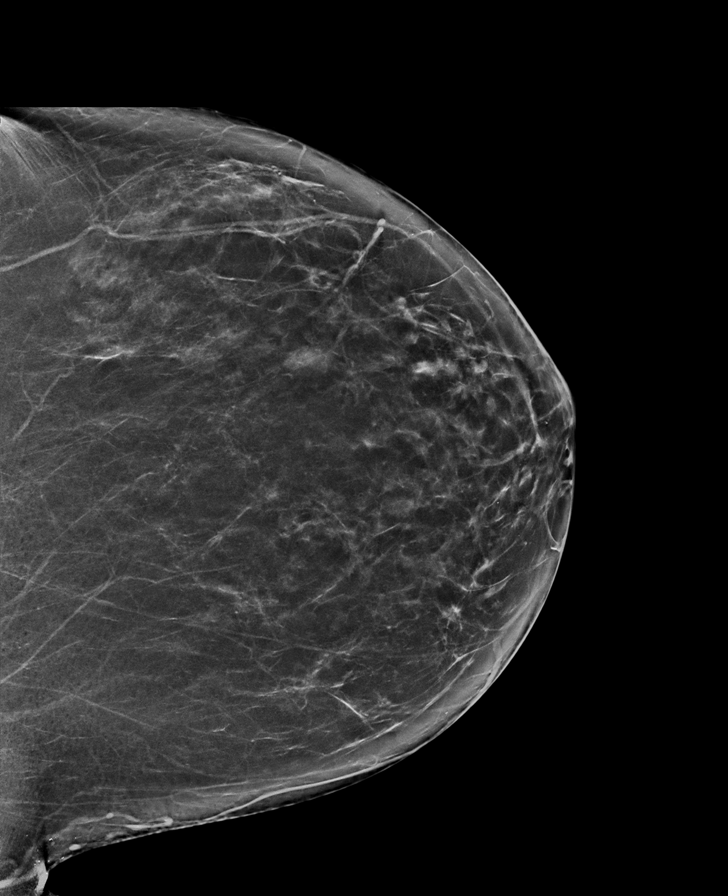

[R MLO synth-2D]
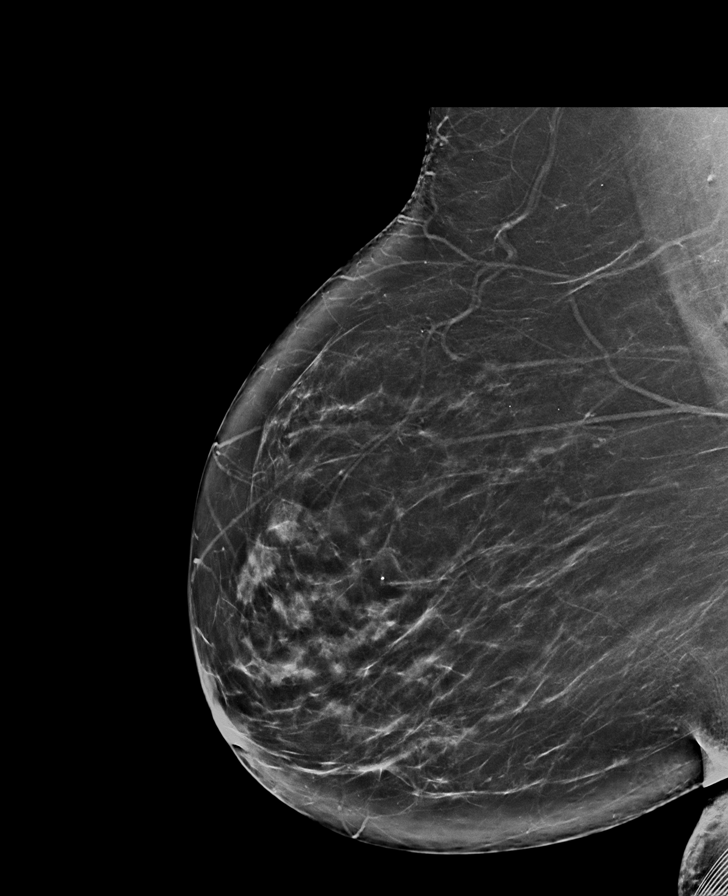

[R MLO tomo · tomo slice 41/80.0]
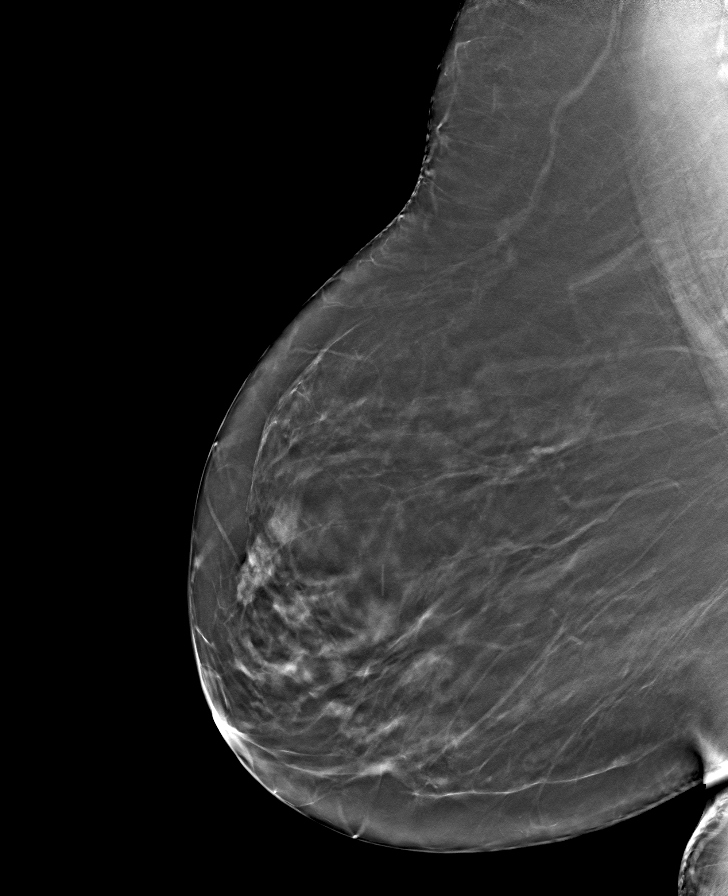

[L MLO tomo · tomo slice 41/82.0]
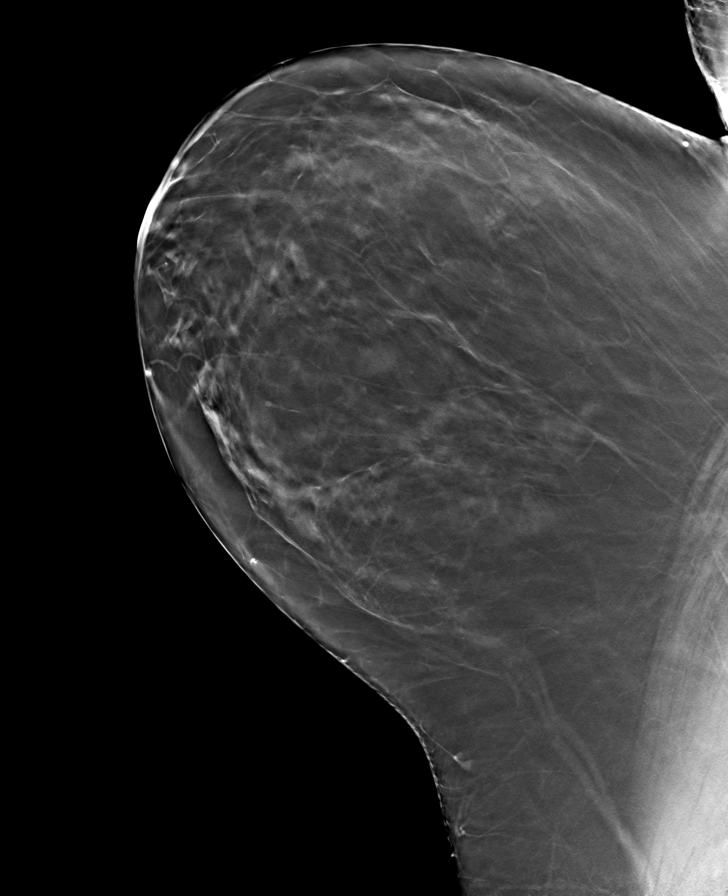

[L CC tomo · tomo slice 37/73.0]
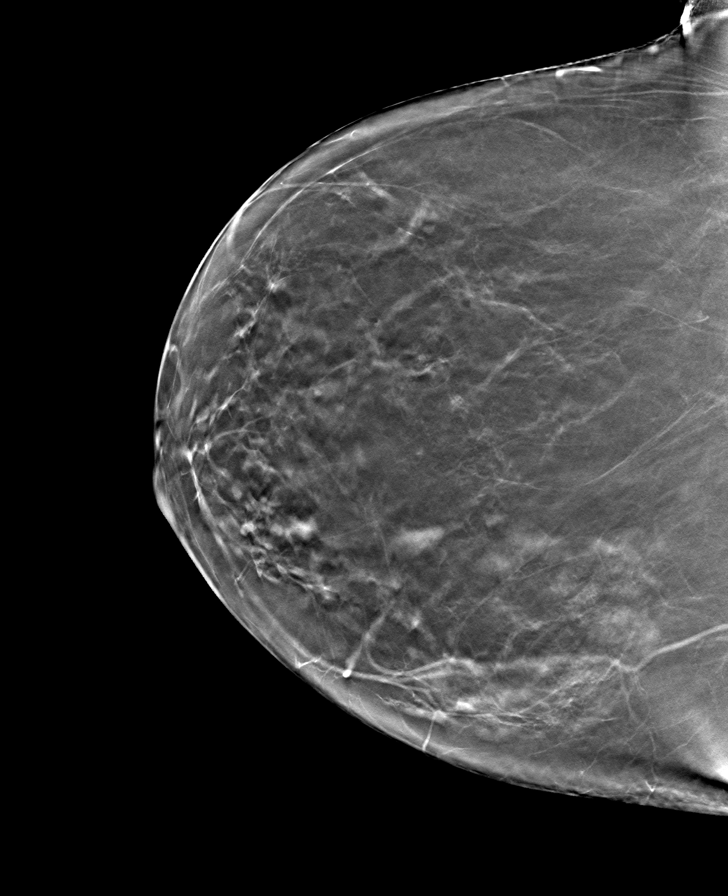

[R CC tomo · tomo slice 37/73.0]
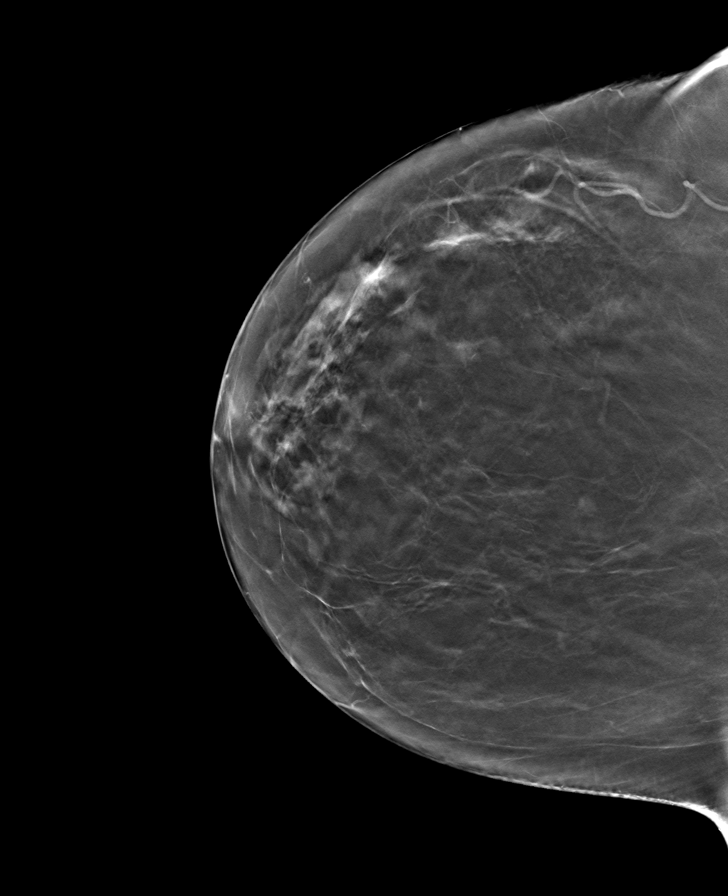

[8 of 24 positions shown; findings below may reference images not displayed]

ACR Breast Density Category b: There are scattered areas of
fibroglandular density.
FINDINGS: There are no findings suspicious for malignancy. Images were
processed with CAD.
IMPRESSION: No mammographic evidence of malignancy. A result letter of this
screening mammogram will be mailed directly to the patient.

RECOMMENDATION:
Screening mammogram in one year. (Code:[id number])

BI-RADS CATEGORY  1: Negative.

## 2020-05-15 ENCOUNTER — Other Ambulatory Visit: Payer: Self-pay | Admitting: Family Medicine

## 2020-07-03 DIAGNOSIS — L814 Other melanin hyperpigmentation: Secondary | ICD-10-CM | POA: Diagnosis not present

## 2020-07-03 DIAGNOSIS — C4359 Malignant melanoma of other part of trunk: Secondary | ICD-10-CM | POA: Diagnosis not present

## 2020-07-03 DIAGNOSIS — L570 Actinic keratosis: Secondary | ICD-10-CM | POA: Diagnosis not present

## 2020-07-03 DIAGNOSIS — D225 Melanocytic nevi of trunk: Secondary | ICD-10-CM | POA: Diagnosis not present

## 2020-07-03 DIAGNOSIS — D1801 Hemangioma of skin and subcutaneous tissue: Secondary | ICD-10-CM | POA: Diagnosis not present

## 2020-07-25 ENCOUNTER — Other Ambulatory Visit: Payer: Self-pay | Admitting: Internal Medicine

## 2020-07-25 DIAGNOSIS — Z1231 Encounter for screening mammogram for malignant neoplasm of breast: Secondary | ICD-10-CM

## 2020-08-03 ENCOUNTER — Other Ambulatory Visit: Payer: Self-pay

## 2020-08-03 ENCOUNTER — Ambulatory Visit
Admission: RE | Admit: 2020-08-03 | Discharge: 2020-08-03 | Disposition: A | Discharge status: Home / Self Care | Payer: 59 | Source: Ambulatory Visit | Attending: Internal Medicine | Admitting: Internal Medicine

## 2020-08-03 DIAGNOSIS — Z1231 Encounter for screening mammogram for malignant neoplasm of breast: Secondary | ICD-10-CM | POA: Diagnosis not present

## 2020-09-11 ENCOUNTER — Other Ambulatory Visit: Payer: Self-pay | Admitting: Family Medicine

## 2020-09-11 ENCOUNTER — Other Ambulatory Visit: Payer: Self-pay | Admitting: Internal Medicine

## 2020-10-25 ENCOUNTER — Encounter: Payer: 59 | Admitting: Internal Medicine

## 2020-11-30 ENCOUNTER — Encounter: Payer: Self-pay | Admitting: Internal Medicine

## 2020-11-30 ENCOUNTER — Other Ambulatory Visit: Payer: Self-pay

## 2020-11-30 ENCOUNTER — Ambulatory Visit (INDEPENDENT_AMBULATORY_CARE_PROVIDER_SITE_OTHER): Payer: 59 | Admitting: Internal Medicine

## 2020-11-30 VITALS — BP 118/86 | HR 86 | Temp 96.3°F | Resp 15 | Ht 63.0 in | Wt 210.8 lb

## 2020-11-30 DIAGNOSIS — E669 Obesity, unspecified: Secondary | ICD-10-CM | POA: Diagnosis not present

## 2020-11-30 DIAGNOSIS — Z0000 Encounter for general adult medical examination without abnormal findings: Secondary | ICD-10-CM

## 2020-11-30 DIAGNOSIS — E559 Vitamin D deficiency, unspecified: Secondary | ICD-10-CM | POA: Diagnosis not present

## 2020-11-30 DIAGNOSIS — E538 Deficiency of other specified B group vitamins: Secondary | ICD-10-CM

## 2020-11-30 DIAGNOSIS — I10 Essential (primary) hypertension: Secondary | ICD-10-CM | POA: Diagnosis not present

## 2020-11-30 DIAGNOSIS — R5383 Other fatigue: Secondary | ICD-10-CM

## 2020-11-30 LAB — LIPID PANEL
Cholesterol: 209 mg/dL — ABNORMAL HIGH (ref 0–200)
HDL: 65.4 mg/dL (ref >39.00)
LDL Cholesterol: 123 mg/dL — ABNORMAL HIGH (ref 0–99)
NonHDL: 143.9
Total CHOL/HDL Ratio: 3
Triglycerides: 103 mg/dL (ref 0.0–149.0)
VLDL: 20.6 mg/dL (ref 0.0–40.0)

## 2020-11-30 LAB — VITAMIN B12: Vitamin B-12: 184 pg/mL — ABNORMAL LOW (ref 211–911)

## 2020-11-30 LAB — COMPREHENSIVE METABOLIC PANEL
ALT: 18 U/L (ref 0–35)
AST: 16 U/L (ref 0–37)
Albumin: 4.7 g/dL (ref 3.5–5.2)
Alkaline Phosphatase: 118 U/L — ABNORMAL HIGH (ref 39–117)
BUN: 17 mg/dL (ref 6–23)
CO2: 27 mEq/L (ref 19–32)
Calcium: 10.4 mg/dL (ref 8.4–10.5)
Chloride: 100 mEq/L (ref 96–112)
Creatinine, Ser: 0.92 mg/dL (ref 0.40–1.20)
GFR: 68.91 mL/min (ref >60.00)
Glucose, Bld: 87 mg/dL (ref 70–99)
Potassium: 4.4 mEq/L (ref 3.5–5.1)
Sodium: 138 mEq/L (ref 135–145)
Total Bilirubin: 0.8 mg/dL (ref 0.2–1.2)
Total Protein: 7.9 g/dL (ref 6.0–8.3)

## 2020-11-30 LAB — TSH: TSH: 1.12 u[IU]/mL (ref 0.35–4.50)

## 2020-11-30 LAB — CBC WITH DIFFERENTIAL/PLATELET
Basophils Absolute: 0.1 10*3/uL (ref 0.0–0.1)
Basophils Relative: 1.1 % (ref 0.0–3.0)
Eosinophils Absolute: 0.1 10*3/uL (ref 0.0–0.7)
Eosinophils Relative: 1.4 % (ref 0.0–5.0)
HCT: 44.3 % (ref 36.0–46.0)
Hemoglobin: 15 g/dL (ref 12.0–15.0)
Lymphocytes Relative: 20.7 % (ref 12.0–46.0)
Lymphs Abs: 1.6 10*3/uL (ref 0.7–4.0)
MCHC: 33.8 g/dL (ref 30.0–36.0)
MCV: 82.8 fl (ref 78.0–100.0)
Monocytes Absolute: 0.5 10*3/uL (ref 0.1–1.0)
Monocytes Relative: 5.7 % (ref 3.0–12.0)
Neutro Abs: 5.6 10*3/uL (ref 1.4–7.7)
Neutrophils Relative %: 71.1 % (ref 43.0–77.0)
Platelets: 296 10*3/uL (ref 150.0–400.0)
RBC: 5.35 Mil/uL — ABNORMAL HIGH (ref 3.87–5.11)
RDW: 14 % (ref 11.5–15.5)
WBC: 7.9 10*3/uL (ref 4.0–10.5)

## 2020-11-30 LAB — VITAMIN D 25 HYDROXY (VIT D DEFICIENCY, FRACTURES): VITD: 20.25 ng/mL — ABNORMAL LOW (ref 30.00–100.00)

## 2020-11-30 MED ORDER — ZOSTER VAC RECOMB ADJUVANTED 50 MCG/0.5ML IM SUSR: 0.5000 mL | Freq: Once | INTRAMUSCULAR | 1 refills | Status: AC

## 2020-11-30 NOTE — Patient Instructions (Signed)

## 2020-11-30 NOTE — Progress Notes (Signed)
Patient ID: Kathryn Torres, female    DOB: [birth year]  Age: 60 y.o. MRN: [id number]  The patient is here for annual preventive examination and management of other chronic and acute problems.   The risk factors are reflected in the social history.  The roster of all physicians providing medical care to patient - is listed in the Snapshot section of the chart.  Activities of daily living:  The patient is 100% independent in all ADLs: dressing, toileting, feeding as well as independent mobility  Home safety : The patient has smoke detectors in the home. They wear seatbelts.  There are no firearms at home. There is no violence in the home.   There is no risks for hepatitis, STDs or HIV. There is no   history of blood transfusion. They have no travel history to infectious disease endemic areas of the world.  The patient has seen their dentist in the last six month. They have seen their eye doctor in the last year. She denies slight hearing difficulty with regard to whispered voices and some television programs.  They have deferred audiologic testing in the last year.  They do not  have excessive sun exposure. Discussed the need for sun protection: hats, long sleeves and use of sunscreen if there is significant sun exposure.   Diet: the importance of a healthy diet is discussed. They do have a healthy diet.  The benefits of regular aerobic exercise were discussed. She walks 4 times per week ,  20 minutes.   Depression screen: there are no signs or vegative symptoms of depression- irritability, change in appetite, anhedonia, sadness/tearfullness.  Cognitive assessment: the patient manages all their financial and personal affairs and is actively engaged. They could relate day,date,year and events; recalled 2/3 objects at 3 minutes; performed clock-face test normally.  The following portions of the patient's history were reviewed and updated as appropriate: allergies, current medications, past family  history, past medical history,  past surgical history, past social history  and problem list.  Visual acuity was not assessed per patient preference since she has regular follow up with her ophthalmologist. Hearing and body mass index were assessed and reviewed.   During the course of the visit the patient was educated and counseled about appropriate screening and preventive services including : fall prevention , diabetes screening, nutrition counseling, colorectal cancer screening, and recommended immunizations.    CC: The primary encounter diagnosis was Fatigue, unspecified type. Diagnoses of Vitamin D deficiency, Primary hypertension, Obesity (BMI 30-39.9), Visit for preventive health examination, and Vitamin B12 deficiency were also pertinent to this visit.  No issues today.  History Kathryn Torres has a past medical history of Hypertension, Lower respiratory tract infection due to COVID-19 virus (03/05/2019), melanoma (2006), and Previous pregnancy with hemolysis, elevated liver enzymes, and low platelet (HELLP) syndrome, antepartum (2002).   She has a past surgical history that includes Cesarean section (2002); Breast biopsy (Right, 2005); and Breast excisional biopsy (Right, 1998).   Her family history includes Breast cancer (age of onset: 60) in her mother; Cancer in her father and mother; Deep vein thrombosis in her maternal grandmother; Hypertension in her mother; Stroke (age of onset: 64) in her paternal grandfather.She reports that she has never smoked. She has never used smokeless tobacco. She reports that she does not drink alcohol and does not use drugs.  Outpatient Medications Prior to Visit  Medication Sig Dispense Refill   losartan (COZAAR) 50 MG tablet TAKE 1 TABLET BY MOUTH DAILY. 90 tablet  1   metoprolol succinate (TOPROL-XL) 25 MG 24 hr tablet TAKE 1 TABLET BY MOUTH DAILY 90 tablet 1   meclizine (ANTIVERT) 25 MG tablet Take 1 tablet (25 mg total) by mouth 3 (three) times daily as  needed for dizziness. 30 tablet 0   cholecalciferol (VITAMIN D) 1000 units tablet Take 1,000 Units by mouth daily. (Patient not taking: Reported on 11/30/2020)     No facility-administered medications prior to visit.    Review of Systems  Patient denies headache, fevers, malaise, unintentional weight loss, skin rash, eye pain, sinus congestion and sinus pain, sore throat, dysphagia,  hemoptysis , cough, dyspnea, wheezing, chest pain, palpitations, orthopnea, edema, abdominal pain, nausea, melena, diarrhea, constipation, flank pain, dysuria, hematuria, urinary  Frequency, nocturia, numbness, tingling, seizures,  Focal weakness, Loss of consciousness,  Tremor, insomnia, depression, anxiety, and suicidal ideation.     Objective:  BP 118/86 (BP Location: Left Arm, Patient Position: Sitting, Cuff Size: Large)   Pulse 86   Temp (!) 96.3 F (35.7 C) (Temporal)   Resp 15   Ht 5\' 3"  (1.6 m)   Wt 210 lb 12.8 oz (95.6 kg)   LMP 06/14/2016 (Exact Date)   SpO2 95%   BMI 37.34 kg/m   Physical Exam  General appearance: alert, cooperative and appears stated age Head: Normocephalic, without obvious abnormality, atraumatic Eyes: conjunctivae/corneas clear. PERRL, EOM's intact. Fundi benign. Ears: normal TM's and external ear canals both ears Nose: Nares normal. Septum midline. Mucosa normal. No drainage or sinus tenderness. Throat: lips, mucosa, and tongue normal; teeth and gums normal Neck: no adenopathy, no carotid bruit, no JVD, supple, symmetrical, trachea midline and thyroid not enlarged, symmetric, no tenderness/mass/nodules Lungs: clear to auscultation bilaterally Breasts: normal appearance, no masses or tenderness Heart: regular rate and rhythm, S1, S2 normal, no murmur, click, rub or gallop Abdomen: soft, non-tender; bowel sounds normal; no masses,  no organomegaly Extremities: extremities normal, atraumatic, no cyanosis or edema Pulses: 2+ and symmetric Skin: Skin color, texture, turgor  normal. No rashes or lesions Neurologic: Alert and oriented X 3, normal strength and tone. Normal symmetric reflexes. Normal coordination and gait.     Assessment & Plan:   Problem List Items Addressed This Visit       Unprioritized   Obesity (BMI 30-39.9) (Chronic)    I have addressed  BMI and recommended wt loss of 10% of body weight over the next 6 months using a low glycemic index diet and regular exercise a minimum of 5 days per week.         Hypertension    Well controlled on current regimen of metoprolol and losartan . Renal function stable, no changes today.  Lab Results  Component Value Date   CREATININE 0.92 11/30/2020   Lab Results  Component Value Date   NA 138 11/30/2020   K 4.4 11/30/2020   CL 100 11/30/2020   CO2 27 11/30/2020          Relevant Orders   Lipid panel (Completed)   Visit for preventive health examination    age appropriate education and counseling updated, referrals for preventative services and immunizations addressed, dietary and smoking counseling addressed, most recent labs reviewed.  I have personally reviewed and have noted:   1) the patient's medical and social history 2) The pt's use of alcohol, tobacco, and illicit drugs 3) The patient's current medications and supplements 4) Functional ability including ADL's, fall risk, home safety risk, hearing and visual impairment 5) Diet and physical  activities 6) Evidence for depression or mood disorder 7) The patient's height, weight, and BMI have been recorded in the chart   I have made referrals, and provided counseling and education based on review of the above       Vitamin D deficiency    Recurrent,  Due to sun avoidance (history of melanoma).  Resume megadose weekly        Relevant Orders   VITAMIN D 25 Hydroxy (Vit-D Deficiency, Fractures) (Completed)   Vitamin B12 deficiency    Her B12 is low and her folate level needs to be redrawn.  She has been asked to supplement  orally until she can be scheduled for  b12 injections weekly for 3 weeks, .  Folate and IF ab needed.        Relevant Orders   RBC Folate   Intrinsic Factor Antibodies   Other Visit Diagnoses     Fatigue, unspecified type    -  Primary   Relevant Orders   TSH (Completed)   CBC with Differential/Platelet (Completed)   Comprehensive metabolic panel (Completed)   Vitamin B12 (Completed)       I have discontinued Shawniece H. Closs's meclizine. I am also having her start on Zoster Vaccine Adjuvanted and ergocalciferol. Additionally, I am having her maintain her cholecalciferol, losartan, and metoprolol succinate.  Meds ordered this encounter  Medications   Zoster Vaccine Adjuvanted [facility]) injection    Sig: Inject 0.5 mLs into the muscle once for 1 dose.    Dispense:  1 each    Refill:  1   ergocalciferol (DRISDOL) 1.25 MG (50000 UT) capsule    Sig: Take 1 capsule (50,000 Units total) by mouth once a week.    Dispense:  12 capsule    Refill:  0    Medications Discontinued During This Encounter  Medication Reason   meclizine (ANTIVERT) 25 MG tablet     Follow-up: No follow-ups on file.   Crecencio Mc, MD

## 2020-12-03 DIAGNOSIS — E538 Deficiency of other specified B group vitamins: Secondary | ICD-10-CM | POA: Insufficient documentation

## 2020-12-03 MED ORDER — ERGOCALCIFEROL 1.25 MG (50000 UT) PO CAPS
50000.0000 [IU] | ORAL_CAPSULE | ORAL | 0 refills | Status: DC
  Filled 2020-12-03: qty 12, 84d supply, fill #0

## 2020-12-03 NOTE — Assessment & Plan Note (Signed)
Recurrent,  Due to sun avoidance (history of melanoma).  Resume megadose weekly

## 2020-12-03 NOTE — Assessment & Plan Note (Signed)

## 2020-12-03 NOTE — Assessment & Plan Note (Signed)
I have addressed  BMI and recommended wt loss of 10% of body weight over the next 6 months using a low glycemic index diet and regular exercise a minimum of 5 days per week.   

## 2020-12-03 NOTE — Assessment & Plan Note (Signed)
Her B12 is low and her folate level needs to be redrawn.  She has been asked to supplement orally until she can be scheduled for  b12 injections weekly for 3 weeks, .  Folate and IF ab needed.

## 2020-12-03 NOTE — Assessment & Plan Note (Signed)
Well controlled on current regimen of metoprolol and losartan . Renal function stable, no changes today.  Lab Results  Component Value Date   CREATININE 0.92 11/30/2020   Lab Results  Component Value Date   NA 138 11/30/2020   K 4.4 11/30/2020   CL 100 11/30/2020   CO2 27 11/30/2020

## 2020-12-04 ENCOUNTER — Other Ambulatory Visit: Payer: Self-pay

## 2020-12-06 ENCOUNTER — Other Ambulatory Visit: Payer: Self-pay

## 2020-12-06 ENCOUNTER — Ambulatory Visit (INDEPENDENT_AMBULATORY_CARE_PROVIDER_SITE_OTHER): Payer: 59

## 2020-12-06 DIAGNOSIS — E538 Deficiency of other specified B group vitamins: Secondary | ICD-10-CM | POA: Diagnosis not present

## 2020-12-06 MED ORDER — CYANOCOBALAMIN 1000 MCG/ML IJ SOLN
1000.0000 ug | Freq: Once | INTRAMUSCULAR | Status: AC
  Administered 2020-12-06: 1000 ug via INTRAMUSCULAR

## 2020-12-06 NOTE — Progress Notes (Signed)
Patient presented for B 12 injection to right deltoid, patient voiced no concerns nor showed any signs of distress during injection. 

## 2020-12-10 LAB — INTRINSIC FACTOR ANTIBODIES: Intrinsic Factor: NEGATIVE

## 2020-12-10 LAB — FOLATE RBC: RBC Folate: 565 ng/mL RBC (ref >280)

## 2020-12-11 NOTE — Assessment & Plan Note (Signed)
Her  intrinisic factor antibody was negative,  So she will  be able to continue treating B12 deficiency  with oral tablets  

## 2020-12-13 ENCOUNTER — Other Ambulatory Visit: Payer: Self-pay

## 2020-12-13 ENCOUNTER — Ambulatory Visit (INDEPENDENT_AMBULATORY_CARE_PROVIDER_SITE_OTHER): Payer: 59

## 2020-12-13 DIAGNOSIS — E538 Deficiency of other specified B group vitamins: Secondary | ICD-10-CM | POA: Diagnosis not present

## 2020-12-13 MED ORDER — CYANOCOBALAMIN 1000 MCG/ML IJ SOLN
1000.0000 ug | Freq: Once | INTRAMUSCULAR | Status: AC
  Administered 2020-12-13: 1000 ug via INTRAMUSCULAR

## 2020-12-13 NOTE — Progress Notes (Signed)
Patient presented for B 12 injection to right deltoid, patient voiced no concerns nor showed any signs of distress during injection. 

## 2020-12-18 ENCOUNTER — Other Ambulatory Visit: Payer: Self-pay

## 2020-12-18 MED FILL — Losartan Potassium Tab 50 MG: ORAL | 90 days supply | Qty: 90 | Fill #0 | Status: AC

## 2020-12-20 ENCOUNTER — Other Ambulatory Visit: Payer: Self-pay

## 2020-12-20 ENCOUNTER — Ambulatory Visit (INDEPENDENT_AMBULATORY_CARE_PROVIDER_SITE_OTHER): Payer: 59 | Admitting: *Deleted

## 2020-12-20 DIAGNOSIS — E538 Deficiency of other specified B group vitamins: Secondary | ICD-10-CM | POA: Diagnosis not present

## 2020-12-20 MED ORDER — CYANOCOBALAMIN 1000 MCG/ML IJ SOLN
1000.0000 ug | Freq: Once | INTRAMUSCULAR | Status: AC
  Administered 2020-12-20: 1000 ug via INTRAMUSCULAR

## 2020-12-20 NOTE — Progress Notes (Signed)
Patient presented for B 12 injection to left deltoid, patient voiced no concerns nor showed any signs of distress during injection. 

## 2021-01-01 ENCOUNTER — Other Ambulatory Visit: Payer: Self-pay | Admitting: Family Medicine

## 2021-01-01 ENCOUNTER — Other Ambulatory Visit: Payer: Self-pay

## 2021-01-01 DIAGNOSIS — C4359 Malignant melanoma of other part of trunk: Secondary | ICD-10-CM | POA: Diagnosis not present

## 2021-01-01 DIAGNOSIS — L821 Other seborrheic keratosis: Secondary | ICD-10-CM | POA: Diagnosis not present

## 2021-01-01 DIAGNOSIS — D1801 Hemangioma of skin and subcutaneous tissue: Secondary | ICD-10-CM | POA: Diagnosis not present

## 2021-01-01 MED ORDER — METOPROLOL SUCCINATE ER 25 MG PO TB24
ORAL_TABLET | Freq: Every day | ORAL | 1 refills | Status: DC
  Filled 2021-01-01: qty 90, 90d supply, fill #0
  Filled 2021-04-24: qty 90, 90d supply, fill #1

## 2021-01-04 DIAGNOSIS — H5203 Hypermetropia, bilateral: Secondary | ICD-10-CM | POA: Diagnosis not present

## 2021-03-26 ENCOUNTER — Other Ambulatory Visit: Payer: Self-pay

## 2021-03-26 ENCOUNTER — Other Ambulatory Visit: Payer: Self-pay | Admitting: Family Medicine

## 2021-03-27 ENCOUNTER — Other Ambulatory Visit: Payer: Self-pay

## 2021-03-27 MED FILL — Losartan Potassium Tab 50 MG: ORAL | 90 days supply | Qty: 90 | Fill #0 | Status: AC

## 2021-04-24 ENCOUNTER — Other Ambulatory Visit: Payer: Self-pay

## 2021-07-04 ENCOUNTER — Other Ambulatory Visit: Payer: Self-pay

## 2021-07-04 MED FILL — Losartan Potassium Tab 50 MG: ORAL | 90 days supply | Qty: 90 | Fill #1 | Status: AC

## 2021-07-18 ENCOUNTER — Other Ambulatory Visit: Payer: Self-pay | Admitting: Internal Medicine

## 2021-07-18 DIAGNOSIS — Z1231 Encounter for screening mammogram for malignant neoplasm of breast: Secondary | ICD-10-CM

## 2021-07-26 DIAGNOSIS — L821 Other seborrheic keratosis: Secondary | ICD-10-CM | POA: Diagnosis not present

## 2021-07-26 DIAGNOSIS — D1801 Hemangioma of skin and subcutaneous tissue: Secondary | ICD-10-CM | POA: Diagnosis not present

## 2021-07-26 DIAGNOSIS — L570 Actinic keratosis: Secondary | ICD-10-CM | POA: Diagnosis not present

## 2021-07-26 DIAGNOSIS — Z85820 Personal history of malignant melanoma of skin: Secondary | ICD-10-CM | POA: Diagnosis not present

## 2021-08-06 ENCOUNTER — Other Ambulatory Visit: Payer: Self-pay

## 2021-08-06 ENCOUNTER — Ambulatory Visit
Admission: RE | Admit: 2021-08-06 | Discharge: 2021-08-06 | Disposition: A | Discharge status: Home / Self Care | Payer: 59 | Source: Ambulatory Visit | Attending: Internal Medicine | Admitting: Internal Medicine

## 2021-08-06 ENCOUNTER — Other Ambulatory Visit: Payer: Self-pay | Admitting: Family Medicine

## 2021-08-06 ENCOUNTER — Other Ambulatory Visit: Payer: Self-pay | Admitting: Internal Medicine

## 2021-08-06 DIAGNOSIS — Z1231 Encounter for screening mammogram for malignant neoplasm of breast: Secondary | ICD-10-CM | POA: Insufficient documentation

## 2021-08-06 MED ORDER — METOPROLOL SUCCINATE ER 25 MG PO TB24
25.0000 mg | ORAL_TABLET | Freq: Every day | ORAL | 2 refills | Status: DC
  Filled 2021-08-06: qty 90, 90d supply, fill #0
  Filled 2021-11-14: qty 90, 90d supply, fill #1
  Filled 2022-02-25: qty 90, 90d supply, fill #2

## 2021-08-21 ENCOUNTER — Other Ambulatory Visit: Payer: Self-pay

## 2021-10-11 ENCOUNTER — Other Ambulatory Visit: Payer: Self-pay | Admitting: Family Medicine

## 2021-10-11 ENCOUNTER — Other Ambulatory Visit: Payer: Self-pay | Admitting: Internal Medicine

## 2021-10-11 ENCOUNTER — Other Ambulatory Visit: Payer: Self-pay

## 2021-10-11 MED FILL — Losartan Potassium Tab 50 MG: ORAL | 90 days supply | Qty: 90 | Fill #0 | Status: AC

## 2021-11-14 ENCOUNTER — Other Ambulatory Visit: Payer: Self-pay

## 2021-12-03 ENCOUNTER — Encounter: Payer: 59 | Admitting: Internal Medicine

## 2022-01-09 ENCOUNTER — Other Ambulatory Visit: Payer: Self-pay

## 2022-01-09 MED FILL — Losartan Potassium Tab 50 MG: ORAL | 90 days supply | Qty: 90 | Fill #1 | Status: AC

## 2022-01-14 ENCOUNTER — Encounter: Payer: Self-pay | Admitting: Internal Medicine

## 2022-01-14 ENCOUNTER — Ambulatory Visit (INDEPENDENT_AMBULATORY_CARE_PROVIDER_SITE_OTHER): Payer: 59 | Admitting: Internal Medicine

## 2022-01-14 ENCOUNTER — Other Ambulatory Visit (HOSPITAL_COMMUNITY)
Admission: RE | Admit: 2022-01-14 | Discharge: 2022-01-14 | Disposition: A | Discharge status: Home / Self Care | Payer: 59 | Source: Ambulatory Visit | Attending: Internal Medicine | Admitting: Internal Medicine

## 2022-01-14 VITALS — BP 130/78 | HR 89 | Temp 98.4°F | Ht 63.0 in | Wt 212.0 lb

## 2022-01-14 DIAGNOSIS — E785 Hyperlipidemia, unspecified: Secondary | ICD-10-CM

## 2022-01-14 DIAGNOSIS — R5383 Other fatigue: Secondary | ICD-10-CM

## 2022-01-14 DIAGNOSIS — R7301 Impaired fasting glucose: Secondary | ICD-10-CM

## 2022-01-14 DIAGNOSIS — Z124 Encounter for screening for malignant neoplasm of cervix: Secondary | ICD-10-CM | POA: Diagnosis not present

## 2022-01-14 DIAGNOSIS — Z0000 Encounter for general adult medical examination without abnormal findings: Secondary | ICD-10-CM

## 2022-01-14 DIAGNOSIS — E538 Deficiency of other specified B group vitamins: Secondary | ICD-10-CM

## 2022-01-14 DIAGNOSIS — E782 Mixed hyperlipidemia: Secondary | ICD-10-CM | POA: Diagnosis not present

## 2022-01-14 DIAGNOSIS — I10 Essential (primary) hypertension: Secondary | ICD-10-CM | POA: Diagnosis not present

## 2022-01-14 DIAGNOSIS — E559 Vitamin D deficiency, unspecified: Secondary | ICD-10-CM | POA: Diagnosis not present

## 2022-01-14 MED ORDER — ZOSTER VAC RECOMB ADJUVANTED 50 MCG/0.5ML IM SUSR: 0.5000 mL | Freq: Once | INTRAMUSCULAR | 1 refills | Status: AC

## 2022-01-15 ENCOUNTER — Other Ambulatory Visit: Payer: Self-pay

## 2022-01-15 DIAGNOSIS — E782 Mixed hyperlipidemia: Secondary | ICD-10-CM | POA: Insufficient documentation

## 2022-01-15 DIAGNOSIS — E538 Deficiency of other specified B group vitamins: Secondary | ICD-10-CM | POA: Insufficient documentation

## 2022-01-15 LAB — CBC WITH DIFFERENTIAL/PLATELET
Basophils Absolute: 0.1 10*3/uL (ref 0.0–0.1)
Basophils Relative: 1 % (ref 0.0–3.0)
Eosinophils Absolute: 0.1 10*3/uL (ref 0.0–0.7)
Eosinophils Relative: 1.1 % (ref 0.0–5.0)
HCT: 41.3 % (ref 36.0–46.0)
Hemoglobin: 13.8 g/dL (ref 12.0–15.0)
Lymphocytes Relative: 17.8 % (ref 12.0–46.0)
Lymphs Abs: 1.6 10*3/uL (ref 0.7–4.0)
MCHC: 33.5 g/dL (ref 30.0–36.0)
MCV: 82.9 fl (ref 78.0–100.0)
Monocytes Absolute: 0.6 10*3/uL (ref 0.1–1.0)
Monocytes Relative: 6.3 % (ref 3.0–12.0)
Neutro Abs: 6.7 10*3/uL (ref 1.4–7.7)
Neutrophils Relative %: 73.8 % (ref 43.0–77.0)
Platelets: 325 10*3/uL (ref 150.0–400.0)
RBC: 4.98 Mil/uL (ref 3.87–5.11)
RDW: 13.7 % (ref 11.5–15.5)
WBC: 9.1 10*3/uL (ref 4.0–10.5)

## 2022-01-15 LAB — COMPREHENSIVE METABOLIC PANEL
ALT: 22 U/L (ref 0–35)
AST: 21 U/L (ref 0–37)
Albumin: 4.4 g/dL (ref 3.5–5.2)
Alkaline Phosphatase: 106 U/L (ref 39–117)
BUN: 20 mg/dL (ref 6–23)
CO2: 27 mEq/L (ref 19–32)
Calcium: 10.3 mg/dL (ref 8.4–10.5)
Chloride: 100 mEq/L (ref 96–112)
Creatinine, Ser: 1.01 mg/dL (ref 0.40–1.20)
GFR: 61.12 mL/min (ref >60.00)
Glucose, Bld: 101 mg/dL — ABNORMAL HIGH (ref 70–99)
Potassium: 4.4 mEq/L (ref 3.5–5.1)
Sodium: 138 mEq/L (ref 135–145)
Total Bilirubin: 0.6 mg/dL (ref 0.2–1.2)
Total Protein: 7.2 g/dL (ref 6.0–8.3)

## 2022-01-15 LAB — B12 AND FOLATE PANEL
Folate: 5.7 ng/mL — ABNORMAL LOW (ref >5.9)
Vitamin B-12: 259 pg/mL (ref 211–911)

## 2022-01-15 LAB — HEMOGLOBIN A1C: Hgb A1c MFr Bld: 5.6 % (ref 4.6–6.5)

## 2022-01-15 LAB — MICROALBUMIN / CREATININE URINE RATIO
Creatinine,U: 167.9 mg/dL
Microalb Creat Ratio: 0.4 mg/g (ref 0.0–30.0)
Microalb, Ur: <0.7 mg/dL (ref 0.0–1.9)

## 2022-01-15 LAB — VITAMIN D 25 HYDROXY (VIT D DEFICIENCY, FRACTURES): VITD: 28.08 ng/mL — ABNORMAL LOW (ref 30.00–100.00)

## 2022-01-15 LAB — TSH: TSH: 0.89 u[IU]/mL (ref 0.35–5.50)

## 2022-01-15 LAB — LIPID PANEL W/REFLEX DIRECT LDL
Cholesterol: 205 mg/dL — ABNORMAL HIGH (ref <200)
HDL: 59 mg/dL (ref >=50)
LDL Cholesterol (Calc): 120 mg/dL (calc) — ABNORMAL HIGH
Non-HDL Cholesterol (Calc): 146 mg/dL (calc) — ABNORMAL HIGH (ref <130)
Total CHOL/HDL Ratio: 3.5 (calc) (ref <5.0)
Triglycerides: 149 mg/dL (ref <150)

## 2022-01-15 MED ORDER — ZOSTER VAC RECOMB ADJUVANTED 50 MCG/0.5ML IM SUSR
INTRAMUSCULAR | 1 refills | Status: DC
  Filled 2022-01-17: qty 1, fill #0
  Filled 2022-01-22: qty 1, 1d supply, fill #0
  Filled 2022-04-09 – 2022-04-12 (×2): qty 1, 1d supply, fill #1

## 2022-01-15 MED ORDER — FOLIC ACID 1 MG PO TABS
1.0000 mg | ORAL_TABLET | Freq: Every day | ORAL | 1 refills | Status: DC
  Filled 2022-01-15: qty 90, 90d supply, fill #0
  Filled 2022-04-23: qty 90, 90d supply, fill #1

## 2022-01-15 MED ORDER — VITAMIN B-12 1000 MCG PO TABS
1000.0000 ug | ORAL_TABLET | Freq: Every day | ORAL | 3 refills | Status: DC
  Filled 2022-01-15 (×2): qty 90, 90d supply, fill #0

## 2022-01-15 NOTE — Assessment & Plan Note (Signed)
propogated by sun avoidance due to history of melanoma

## 2022-01-15 NOTE — Assessment & Plan Note (Signed)
Well controlled on current regimen of metoprolol and losartan . Renal function stable, no changes today.

## 2022-01-15 NOTE — Addendum Note (Signed)
Addended by: Crecencio Mc on: 01/15/2022 05:10 PM   Modules accepted: Orders

## 2022-01-15 NOTE — Assessment & Plan Note (Signed)
Supplementation advised.

## 2022-01-15 NOTE — Assessment & Plan Note (Signed)
10 yr risk is 4% using AHA risk calculator. No treatment advised except exercise and weight loss.   Lab Results  Component Value Date   CHOL 205 (H) 01/14/2022   HDL 59 01/14/2022   LDLCALC 120 (H) 01/14/2022   TRIG 149 01/14/2022   CHOLHDL 3.5 01/14/2022

## 2022-01-15 NOTE — Progress Notes (Addendum)
The patient is here for annual preventive examination and management of other chronic and acute problems.   The risk factors are reflected in the social history.   The roster of all physicians providing medical care to patient - is listed in the Snapshot section of the chart.   Activities of daily living:  The patient is 100% independent in all ADLs: dressing, toileting, feeding as well as independent mobility   Home safety : The patient has smoke detectors in the home. They wear seatbelts.  There are no unsecured firearms at home. There is no violence in the home.    There is no risks for hepatitis, STDs or HIV. There is no   history of blood transfusion. They have no travel history to infectious disease endemic areas of the world.   The patient has seen their dentist in the last six month. They have seen their eye doctor in the last year. The patinet  denies slight hearing difficulty with regard to whispered voices and some television programs.  They have deferred audiologic testing in the last year.  They do not  have excessive sun exposure. Discussed the need for sun protection: hats, long sleeves and use of sunscreen if there is significant sun exposure.    Diet: the importance of a healthy diet is discussed. They do have a healthy diet.   The benefits of regular aerobic exercise were discussed. The patient is walking daily for exercise    Depression screen: there are no signs or vegative symptoms of depression- irritability, change in appetite, anhedonia, sadness/tearfullness.   The following portions of the patient's history were reviewed and updated as appropriate: allergies, current medications, past family history, past medical history,  past surgical history, past social history  and problem list.   Visual acuity was not assessed per patient preference since the patient has regular follow up with an  ophthalmologist. Hearing and body mass index were assessed and reviewed.    During  the course of the visit the patient was educated and counseled about appropriate screening and preventive services including : fall prevention , diabetes screening, nutrition counseling, colorectal cancer screening, and recommended immunizations.    Chief Complaint:  none   Review of Symptoms  Patient denies headache, fevers, malaise, unintentional weight loss, skin rash, eye pain, sinus congestion and sinus pain, sore throat, dysphagia,  hemoptysis , cough, dyspnea, wheezing, chest pain, palpitations, orthopnea, edema, abdominal pain, nausea, melena, diarrhea, constipation, flank pain, dysuria, hematuria, urinary  Frequency, nocturia, numbness, tingling, seizures,  Focal weakness, Loss of consciousness,  Tremor, insomnia, depression, anxiety, and suicidal ideation.    Physical Exam:  BP 130/78 (BP Location: Left Arm, Patient Position: Sitting, Cuff Size: Large)   Pulse 89   Temp 98.4 F (36.9 C) (Oral)   Ht '5\' 3"'$  (1.6 m)   Wt 212 lb (96.2 kg)   LMP 06/14/2016 (Exact Date)   SpO2 96%   BMI 37.55 kg/m    General appearance: alert, cooperative and appears stated age Head: Normocephalic, without obvious abnormality, atraumatic Eyes: conjunctivae/corneas clear. PERRL, EOM's intact. Fundi benign. Ears: normal TM's and external ear canals both ears Nose: Nares normal. Septum midline. Mucosa normal. No drainage or sinus tenderness. Throat: lips, mucosa, and tongue normal; teeth and gums normal Neck: no adenopathy, no carotid bruit, no JVD, supple, symmetrical, trachea midline and thyroid not enlarged, symmetric, no tenderness/mass/nodules Lungs: clear to auscultation bilaterally Breasts: normal appearance, no masses or tenderness Heart: regular rate and rhythm, S1, S2 normal,  no murmur, click, rub or gallop Abdomen: soft, non-tender; bowel sounds normal; no masses,  no organomegaly Extremities: extremities normal, atraumatic, no cyanosis or edema Pulses: 2+ and symmetric Skin: Skin  color, texture, turgor normal. No rashes or lesions Neurologic: Alert and oriented X 3, normal strength and tone. Normal symmetric reflexes. Normal coordination and gait.      Assessment and Plan:  Visit for preventive health examination age appropriate education and counseling updated, referrals for preventative services and immunizations addressed, dietary and smoking counseling addressed, most recent labs reviewed.  I have personally reviewed and have noted:   1) the patient's medical and social history 2) The pt's use of alcohol, tobacco, and illicit drugs 3) The patient's current medications and supplements 4) Functional ability including ADL's, fall risk, home safety risk, hearing and visual impairment 5) Diet and physical activities 6) Evidence for depression or mood disorder 7) The patient's height, weight, and BMI have been recorded in the chart  I have made referrals, and provided counseling and education based on review of the above  Vitamin D deficiency propogated by sun avoidance due to history of melanoma   Hypertension Well controlled on current regimen of metoprolol and losartan . Renal function stable, no changes today.    Hyperlipidemia, mixed 10 yr risk is 4% using AHA risk calculator. No treatment advised except exercise and weight loss.   Lab Results  Component Value Date   CHOL 205 (H) 01/14/2022   HDL 59 01/14/2022   LDLCALC 120 (H) 01/14/2022   TRIG 149 01/14/2022   CHOLHDL 3.5 01/14/2022     Vitamin B12 deficiency Her  intrinisic factor antibody was negative,  So she will  be able to continue treating B12 deficiency  with oral tablets   Folic acid deficiency Supplementation advised.    Updated Medication List Outpatient Encounter Medications as of 01/14/2022  Medication Sig   cholecalciferol (VITAMIN D) 1000 units tablet Take 1,000 Units by mouth daily.   folic acid (FOLVITE) 1 MG tablet Take 1 tablet (1 mg total) by mouth daily.   losartan  (COZAAR) 50 MG tablet TAKE 1 TABLET BY MOUTH DAILY.   metoprolol succinate (TOPROL-XL) 25 MG 24 hr tablet Take 1 tablet (25 mg total) by mouth daily.   vitamin B-12 (CYANOCOBALAMIN) 1000 MCG tablet Take 1 tablet (1,000 mcg total) by mouth daily.   [EXPIRED] Zoster Vaccine Adjuvanted [facility]) injection Inject 0.5 mLs into the muscle once for 1 dose.   [DISCONTINUED] ergocalciferol (DRISDOL) 1.25 MG (50000 UT) capsule Take 1 capsule (50,000 Units total) by mouth once a week. (Patient not taking: Reported on 01/14/2022)   No facility-administered encounter medications on file as of 01/14/2022.

## 2022-01-15 NOTE — Assessment & Plan Note (Signed)

## 2022-01-15 NOTE — Assessment & Plan Note (Signed)
Her  intrinisic factor antibody was negative,  So she will  be able to continue treating B12 deficiency  with oral tablets

## 2022-01-16 ENCOUNTER — Other Ambulatory Visit: Payer: Self-pay

## 2022-01-17 ENCOUNTER — Other Ambulatory Visit: Payer: Self-pay

## 2022-01-17 LAB — CYTOLOGY - PAP
Comment: NEGATIVE
Diagnosis: NEGATIVE
High risk HPV: NEGATIVE

## 2022-01-22 ENCOUNTER — Other Ambulatory Visit: Payer: Self-pay

## 2022-01-23 ENCOUNTER — Encounter: Payer: Self-pay | Admitting: Internal Medicine

## 2022-02-04 DIAGNOSIS — L814 Other melanin hyperpigmentation: Secondary | ICD-10-CM | POA: Diagnosis not present

## 2022-02-04 DIAGNOSIS — Z85820 Personal history of malignant melanoma of skin: Secondary | ICD-10-CM | POA: Diagnosis not present

## 2022-02-04 DIAGNOSIS — L821 Other seborrheic keratosis: Secondary | ICD-10-CM | POA: Diagnosis not present

## 2022-02-04 DIAGNOSIS — L570 Actinic keratosis: Secondary | ICD-10-CM | POA: Diagnosis not present

## 2022-02-04 DIAGNOSIS — D1801 Hemangioma of skin and subcutaneous tissue: Secondary | ICD-10-CM | POA: Diagnosis not present

## 2022-02-25 ENCOUNTER — Other Ambulatory Visit: Payer: Self-pay

## 2022-02-26 ENCOUNTER — Other Ambulatory Visit: Payer: Self-pay

## 2022-02-27 ENCOUNTER — Telehealth: Payer: Self-pay | Admitting: Internal Medicine

## 2022-02-27 NOTE — Telephone Encounter (Signed)
Pt came into office to drop off health form to fill out. Placed in provider folder

## 2022-02-28 DIAGNOSIS — Z0279 Encounter for issue of other medical certificate: Secondary | ICD-10-CM

## 2022-02-28 NOTE — Telephone Encounter (Signed)
Placed in red folder for completion.  

## 2022-03-06 NOTE — Telephone Encounter (Signed)
Pt called about

## 2022-03-14 NOTE — Telephone Encounter (Signed)
Pt is aware that form is complete and ready for pick up.

## 2022-03-25 ENCOUNTER — Telehealth: Payer: 59 | Admitting: Physician Assistant

## 2022-03-25 ENCOUNTER — Other Ambulatory Visit: Payer: Self-pay

## 2022-03-25 DIAGNOSIS — U071 COVID-19: Secondary | ICD-10-CM

## 2022-03-25 MED ORDER — ONDANSETRON 4 MG PO TBDP
4.0000 mg | ORAL_TABLET | Freq: Three times a day (TID) | ORAL | 0 refills | Status: DC | PRN
  Filled 2022-03-25: qty 20, 7d supply, fill #0

## 2022-03-25 MED ORDER — NIRMATRELVIR/RITONAVIR (PAXLOVID)TABLET
3.0000 | ORAL_TABLET | Freq: Two times a day (BID) | ORAL | 0 refills | Status: AC
  Filled 2022-03-25: qty 30, 5d supply, fill #0

## 2022-03-25 NOTE — Progress Notes (Signed)
Virtual Visit Consent   Kathryn Torres, you are scheduled for a virtual visit with a [facility] provider today. Just as with appointments in the office, your consent must be obtained to participate. Your consent will be active for this visit and any virtual visit you may have with one of our providers in the next 365 days. If you have a MyChart account, a copy of this consent can be sent to you electronically.  As this is a virtual visit, video technology does not allow for your provider to perform a traditional examination. This may limit your provider's ability to fully assess your condition. If your provider identifies any concerns that need to be evaluated in person or the need to arrange testing (such as labs, EKG, etc.), we will make arrangements to do so. Although advances in technology are sophisticated, we cannot ensure that it will always work on either your end or our end. If the connection with a video visit is poor, the visit may have to be switched to a telephone visit. With either a video or telephone visit, we are not always able to ensure that we have a secure connection.  By engaging in this virtual visit, you consent to the provision of healthcare and authorize for your insurance to be billed (if applicable) for the services provided during this visit. Depending on your insurance coverage, you may receive a charge related to this service.  I need to obtain your verbal consent now. Are you willing to proceed with your visit today? Kathryn Torres has provided verbal consent on 03/25/2022 for a virtual visit (video or telephone). Mar Daring, PA-C  Date: 03/25/2022 8:46 AM  Virtual Visit via Video Note   I, Mar Daring, connected with  Kathryn Torres  ([id number], [birth year]) on 03/25/22 at  8:30 AM EDT by a video-enabled telemedicine application and verified that I am speaking with the correct person using two identifiers.  Location: Patient: Virtual Visit Location  Patient: Home Provider: Virtual Visit Location Provider: Home Office   I discussed the limitations of evaluation and management by telemedicine and the availability of in person appointments. The patient expressed understanding and agreed to proceed.    History of Present Illness: Kathryn Torres is a 61 y.o. who identifies as a female who was assigned female at birth, and is being seen today for Covid 75.  HPI: URI  This is a new problem. The current episode started in the past 7 days (Symptoms started Saturday; Tested positive on at home test this morning). The problem has been gradually worsening. Maximum temperature: subjective fevers. The fever has been present for Less than 1 day. Associated symptoms include congestion, coughing, headaches, nausea (mild), a plugged ear sensation, rhinorrhea, sinus pain, a sore throat and vomiting (once). Pertinent negatives include no diarrhea or ear pain. Associated symptoms comments: Myalgias. Treatments tried: advil-tylenol combination, nyquil (generic) The treatment provided no relief.     Problems:  Patient Active Problem List   Diagnosis Date Noted   Hyperlipidemia, mixed 69/48/5462   Folic acid deficiency 70/35/0093   Vitamin B12 deficiency 12/03/2020   Renal cyst, acquired, left 10/23/2019   Hospital discharge follow-up 03/17/2019   Allergic rhinitis 03/30/2017   Vitamin D deficiency 05/25/2013   Visit for preventive health examination 05/18/2012   Symptomatic cholelithiasis 05/18/2012   Obesity (BMI 30-39.9) 11/18/2011   Previous pregnancy with hemolysis, elevated liver enzymes, and low platelet (HELLP) syndrome, antepartum    Hypertension  History of melanoma excision     Allergies:  Allergies  Allergen Reactions   Contrast Media [Iodinated Contrast Media]    Medications:  Current Outpatient Medications:    nirmatrelvir/ritonavir EUA (PAXLOVID) 20 x 150 MG & 10 x '100MG'$  TABS, Take 3 tablets by mouth 2 (two) times daily for 5 days.  (Take nirmatrelvir 150 mg two tablets twice daily for 5 days and ritonavir 100 mg one tablet twice daily for 5 days) Patient GFR is 61.25, Disp: 30 tablet, Rfl: 0   ondansetron (ZOFRAN-ODT) 4 MG disintegrating tablet, Take 1 tablet (4 mg total) by mouth every 8 (eight) hours as needed for nausea or vomiting., Disp: 20 tablet, Rfl: 0   cholecalciferol (VITAMIN D) 1000 units tablet, Take 1,000 Units by mouth daily., Disp: , Rfl:    folic acid (FOLVITE) 1 MG tablet, Take 1 tablet (1 mg total) by mouth daily., Disp: 90 tablet, Rfl: 1   losartan (COZAAR) 50 MG tablet, TAKE 1 TABLET BY MOUTH DAILY., Disp: 90 tablet, Rfl: 1   metoprolol succinate (TOPROL-XL) 25 MG 24 hr tablet, Take 1 tablet (25 mg total) by mouth daily., Disp: 90 tablet, Rfl: 2   vitamin B-12 (CYANOCOBALAMIN) 1000 MCG tablet, Take 1 tablet (1,000 mcg total) by mouth daily., Disp: 90 tablet, Rfl: 3   Zoster Vaccine Adjuvanted [facility]) injection, Inject into the muscle., Disp: 1 each, Rfl: 1  Observations/Objective: Patient is well-developed, well-nourished in no acute distress.  Resting comfortably at home.  Head is normocephalic, atraumatic.  No labored breathing.  Speech is clear and coherent with logical content.  Patient is alert and oriented at baseline.    Assessment and Plan: 1. COVID-19 - nirmatrelvir/ritonavir EUA (PAXLOVID) 20 x 150 MG & 10 x '100MG'$  TABS; Take 3 tablets by mouth 2 (two) times daily for 5 days. (Take nirmatrelvir 150 mg two tablets twice daily for 5 days and ritonavir 100 mg one tablet twice daily for 5 days) Patient GFR is 61.25  Dispense: 30 tablet; Refill: 0 - ondansetron (ZOFRAN-ODT) 4 MG disintegrating tablet; Take 1 tablet (4 mg total) by mouth every 8 (eight) hours as needed for nausea or vomiting.  Dispense: 20 tablet; Refill: 0  - Continue OTC symptomatic management of choice - Will send OTC vitamins and supplement information through AVS - Paxlovid and Zofran prescribed - Patient enrolled in  MyChart symptom monitoring - Push fluids - Rest as needed - Discussed return precautions and when to seek in-person evaluation, sent via AVS as well   Follow Up Instructions: I discussed the assessment and treatment plan with the patient. The patient was provided an opportunity to ask questions and all were answered. The patient agreed with the plan and demonstrated an understanding of the instructions.  A copy of instructions were sent to the patient via MyChart unless otherwise noted below.    The patient was advised to call back or seek an in-person evaluation if the symptoms worsen or if the condition fails to improve as anticipated.  Time:  I spent 15 minutes with the patient via telehealth technology discussing the above problems/concerns.    Mar Daring, PA-C

## 2022-03-25 NOTE — Patient Instructions (Signed)
Ciro Backer, thank you for joining Mar Daring, PA-C for today's virtual visit.  While this provider is not your primary care provider (PCP), if your PCP is located in our provider database this encounter information will be shared with them immediately following your visit.  Consent: (Patient) Ciro Backer provided verbal consent for this virtual visit at the beginning of the encounter.  Current Medications:  Current Outpatient Medications:    nirmatrelvir/ritonavir EUA (PAXLOVID) 20 x 150 MG & 10 x '100MG'$  TABS, Take 3 tablets by mouth 2 (two) times daily for 5 days. (Take nirmatrelvir 150 mg two tablets twice daily for 5 days and ritonavir 100 mg one tablet twice daily for 5 days) Patient GFR is 61.25, Disp: 30 tablet, Rfl: 0   ondansetron (ZOFRAN-ODT) 4 MG disintegrating tablet, Take 1 tablet (4 mg total) by mouth every 8 (eight) hours as needed for nausea or vomiting., Disp: 20 tablet, Rfl: 0   cholecalciferol (VITAMIN D) 1000 units tablet, Take 1,000 Units by mouth daily., Disp: , Rfl:    folic acid (FOLVITE) 1 MG tablet, Take 1 tablet (1 mg total) by mouth daily., Disp: 90 tablet, Rfl: 1   losartan (COZAAR) 50 MG tablet, TAKE 1 TABLET BY MOUTH DAILY., Disp: 90 tablet, Rfl: 1   metoprolol succinate (TOPROL-XL) 25 MG 24 hr tablet, Take 1 tablet (25 mg total) by mouth daily., Disp: 90 tablet, Rfl: 2   vitamin B-12 (CYANOCOBALAMIN) 1000 MCG tablet, Take 1 tablet (1,000 mcg total) by mouth daily., Disp: 90 tablet, Rfl: 3   Zoster Vaccine Adjuvanted [facility]) injection, Inject into the muscle., Disp: 1 each, Rfl: 1   Medications ordered in this encounter:  Meds ordered this encounter  Medications   nirmatrelvir/ritonavir EUA (PAXLOVID) 20 x 150 MG & 10 x '100MG'$  TABS    Sig: Take 3 tablets by mouth 2 (two) times daily for 5 days. (Take nirmatrelvir 150 mg two tablets twice daily for 5 days and ritonavir 100 mg one tablet twice daily for 5 days) Patient GFR is 61.25    Dispense:   30 tablet    Refill:  0    Order Specific Question:   Supervising Provider    Answer:   Chase Picket [[id number]]   ondansetron (ZOFRAN-ODT) 4 MG disintegrating tablet    Sig: Take 1 tablet (4 mg total) by mouth every 8 (eight) hours as needed for nausea or vomiting.    Dispense:  20 tablet    Refill:  0    Order Specific Question:   Supervising Provider    Answer:   Chase Picket [id number]     *If you need refills on other medications prior to your next appointment, please contact your pharmacy*  Follow-Up: Call back or seek an in-person evaluation if the symptoms worsen or if the condition fails to improve as anticipated.  [place] [phone]  Other Instructions  Can take to lessen severity: Vit C '500mg'$  twice daily Quercertin 250-'500mg'$  twice daily Zinc 75-'100mg'$  daily Melatonin 3-6 mg at bedtime Vit D3 1000-2000 IU daily Aspirin 81 mg daily with food Optional: Famotidine '20mg'$  daily Also can add tylenol/ibuprofen as needed for fevers and body aches May add Mucinex or Mucinex DM as needed for cough/congestion   COVID-19 COVID-19, or coronavirus disease 2019, is an infection that is caused by a new (novel) coronavirus called SARS-CoV-2. COVID-19 can cause many symptoms. In some people, the virus may not cause any symptoms. In others, it may  cause mild or severe symptoms. Some people with severe infection develop severe disease. What are the causes? This illness is caused by a virus. The virus may be in the air as tiny specks of fluid (aerosols) or droplets, or it may be on surfaces. You may catch the virus by: Breathing in droplets from an infected person. Droplets can be spread by a person breathing, speaking, singing, coughing, or sneezing. Touching something, like a table or a doorknob, that has virus on it (is contaminated) and then touching your mouth, nose, or eyes. What increases the risk? Risk for infection: You are more likely to get infected  with the COVID-19 virus if: You are within 6 ft (1.8 m) of a person with COVID-19 for 15 minutes or longer. You are providing care for a person who is infected with COVID-19. You are in close personal contact with other people. Close personal contact includes hugging, kissing, or sharing eating or drinking utensils. Risk for serious illness caused by COVID-19: You are more likely to get seriously ill from the COVID-19 virus if: You have cancer. You have a long-term (chronic) disease, such as: Chronic lung disease. This includes pulmonary embolism, chronic obstructive pulmonary disease, and cystic fibrosis. Long-term disease that lowers your body's ability to fight infection (immunocompromise). Serious cardiac conditions, such as heart failure, coronary artery disease, or cardiomyopathy. Diabetes. Chronic kidney disease. Liver diseases. These include cirrhosis, nonalcoholic fatty liver disease, alcoholic liver disease, or autoimmune hepatitis. You have obesity. You are pregnant or were recently pregnant. You have sickle cell disease. What are the signs or symptoms? Symptoms of this condition can range from mild to severe. Symptoms may appear any time from 2 to 14 days after being exposed to the virus. They include: Fever or chills. Shortness of breath or trouble breathing. Feeling tired or very tired. Headaches, body aches, or muscle aches. Runny or stuffy nose, sneezing, coughing, or sore throat. New loss of taste or smell. This is rare. Some people may also have stomach problems, such as nausea, vomiting, or diarrhea. Other people may not have any symptoms of COVID-19. How is this diagnosed? This condition may be diagnosed by testing samples to check for the COVID-19 virus. The most common tests are the PCR test and the antigen test. Tests may be done in the lab or at home. They include: Using a swab to take a sample of fluid from the back of your nose and throat (nasopharyngeal fluid),  from your nose, or from your throat. Testing a sample of saliva from your mouth. Testing a sample of coughed-up mucus from your lungs (sputum). How is this treated? Treatment for COVID-19 infection depends on the severity of the condition. Mild symptoms can be managed at home with rest, fluids, and over-the-counter medicines. Serious symptoms may be treated in a hospital intensive care unit (ICU). Treatment in the ICU may include: Supplemental oxygen. Extra oxygen is given through a tube in the nose, a face mask, or a hood. Medicines. These may include: Antivirals, such as monoclonal antibodies. These help your body fight off certain viruses that can cause disease. Anti-inflammatories, such as corticosteroids. These reduce inflammation and suppress the immune system. Antithrombotics. These prevent or treat blood clots, if they develop. Convalescent plasma. This helps boost your immune system, if you have an underlying immunosuppressive condition or are getting immunosuppressive treatments. Prone positioning. This means you will lie on your stomach. This helps oxygen to get into your lungs. Infection control measures. If you are at risk  for more serious illness caused by COVID-19, your health care provider may prescribe two long-acting monoclonal antibodies, given together every 6 months. How is this prevented? To protect yourself: Use preventive medicine (pre-exposure prophylaxis). You may get pre-exposure prophylaxis if you have moderate or severe immunocompromise. Get vaccinated. Anyone 27 months old or older who meets guidelines can get a COVID-19 vaccine or vaccine series. This includes people who are pregnant or making breast milk (lactating). Get an added dose of COVID-19 vaccine after your first vaccine or vaccine series if you have moderate to severe immunocompromise. This applies if you have had a solid organ transplant or have been diagnosed with an immunocompromising condition. You  should get the added dose 4 weeks after you got the first COVID-19 vaccine or vaccine series. If you get an mRNA vaccine, you will need a 3-dose primary series. If you get the J&J/Janssen vaccine, you will need a 2-dose primary series, with the second dose being an mRNA vaccine. Talk to your health care provider about getting experimental monoclonal antibodies. This treatment is approved under emergency use authorization to prevent severe illness before or after being exposed to the COVID-19 virus. You may be given monoclonal antibodies if: You have moderate or severe immunocompromise. This includes treatments that lower your immune response. People with immunocompromise may not develop protection against COVID-19 when they are vaccinated. You cannot be vaccinated. You may not get a vaccine if you have a severe allergic reaction to the vaccine or its components. You are not fully vaccinated. You are in a facility where COVID-19 is present and: Are in close contact with a person who is infected with the COVID-19 virus. Are at high risk of being exposed to the COVID-19 virus. You are at risk of illness from new variants of the COVID-19 virus. To protect others: If you have symptoms of COVID-19, take steps to prevent the virus from spreading to others. Stay home. Leave your house only to get medical care. Do not use public transit, if possible. Do not travel while you are sick. Wash your hands often with soap and water for at least 20 seconds. If soap and water are not available, use alcohol-based hand sanitizer. Make sure that all people in your household wash their hands well and often. Cough or sneeze into a tissue or your sleeve or elbow. Do not cough or sneeze into your hand or into the air. Where to find more information Centers for Disease Control and Prevention: CharmCourses.be World Health Organization: https://www.castaneda.info/ Get help right away if: You have trouble  breathing. You have pain or pressure in your chest. You are confused. You have bluish lips and fingernails. You have trouble waking from sleep. You have symptoms that get worse. These symptoms may be an emergency. Get help right away. Call 911. Do not wait to see if the symptoms will go away. Do not drive yourself to the hospital. Summary COVID-19 is an infection that is caused by a new coronavirus. Sometimes, there are no symptoms. Other times, symptoms range from mild to severe. Some people with a severe COVID-19 infection develop severe disease. The virus that causes COVID-19 can spread from person to person through droplets or aerosols from breathing, speaking, singing, coughing, or sneezing. Mild symptoms of COVID-19 can be managed at home with rest, fluids, and over-the-counter medicines. This information is not intended to replace advice given to you by your health care provider. Make sure you discuss any questions you have with your health care provider.  Document Revised: 05/31/2021 Document Reviewed: 05/31/2021 Elsevier Patient Education  [place].    If you have been instructed to have an in-person evaluation today at a local Urgent Care facility, please use the link below. It will take you to a list of all of our available [facility], including address, phone number and hours of operation. Please do not delay care.  [facility]  If you or a family member do not have a primary care provider, use the link below to schedule a visit and establish care. When you choose a [facility] primary care physician or advanced practice provider, you gain a long-term partner in health. Find a Primary Care Provider  Learn more about [facility] in-office and virtual care options: [facility]

## 2022-04-09 ENCOUNTER — Other Ambulatory Visit: Payer: Self-pay | Admitting: Internal Medicine

## 2022-04-10 ENCOUNTER — Other Ambulatory Visit: Payer: Self-pay

## 2022-04-11 ENCOUNTER — Other Ambulatory Visit: Payer: Self-pay

## 2022-04-11 MED ORDER — LOSARTAN POTASSIUM 50 MG PO TABS
ORAL_TABLET | Freq: Every day | ORAL | 1 refills | Status: DC
  Filled 2022-04-11: qty 90, 90d supply, fill #0
  Filled 2022-07-06: qty 90, 90d supply, fill #1

## 2022-04-12 ENCOUNTER — Other Ambulatory Visit: Payer: Self-pay

## 2022-04-23 ENCOUNTER — Other Ambulatory Visit: Payer: Self-pay

## 2022-04-30 ENCOUNTER — Other Ambulatory Visit: Payer: Self-pay

## 2022-05-08 ENCOUNTER — Other Ambulatory Visit: Payer: Self-pay

## 2022-05-21 ENCOUNTER — Other Ambulatory Visit: Payer: Self-pay | Admitting: Internal Medicine

## 2022-05-22 ENCOUNTER — Other Ambulatory Visit: Payer: Self-pay

## 2022-05-22 MED ORDER — METOPROLOL SUCCINATE ER 25 MG PO TB24
25.0000 mg | ORAL_TABLET | Freq: Every day | ORAL | 2 refills | Status: DC
  Filled 2022-05-22: qty 90, 90d supply, fill #0
  Filled 2022-08-24: qty 90, 90d supply, fill #1

## 2022-08-27 ENCOUNTER — Other Ambulatory Visit: Payer: Self-pay | Admitting: Internal Medicine

## 2022-08-27 DIAGNOSIS — Z1231 Encounter for screening mammogram for malignant neoplasm of breast: Secondary | ICD-10-CM

## 2022-09-26 ENCOUNTER — Other Ambulatory Visit: Payer: Self-pay

## 2022-09-26 ENCOUNTER — Ambulatory Visit
Admission: RE | Admit: 2022-09-26 | Discharge: 2022-09-26 | Disposition: A | Discharge status: Home / Self Care | Payer: BC Managed Care – PPO | Source: Ambulatory Visit | Attending: Internal Medicine | Admitting: Internal Medicine

## 2022-09-26 DIAGNOSIS — Z1231 Encounter for screening mammogram for malignant neoplasm of breast: Secondary | ICD-10-CM | POA: Insufficient documentation

## 2022-09-26 MED ORDER — TRIAMCINOLONE ACETONIDE 0.1 % EX CREA
1.0000 | TOPICAL_CREAM | Freq: Two times a day (BID) | CUTANEOUS | 1 refills | Status: DC
  Filled 2022-09-26: qty 60, 30d supply, fill #0

## 2022-10-01 ENCOUNTER — Other Ambulatory Visit: Payer: Self-pay

## 2022-10-21 ENCOUNTER — Other Ambulatory Visit: Payer: Self-pay

## 2022-10-21 ENCOUNTER — Other Ambulatory Visit: Payer: Self-pay | Admitting: Internal Medicine

## 2022-10-21 MED FILL — Losartan Potassium Tab 50 MG: ORAL | 90 days supply | Qty: 90 | Fill #0 | Status: AC

## 2022-12-17 ENCOUNTER — Telehealth: Payer: Self-pay

## 2022-12-17 ENCOUNTER — Inpatient Hospital Stay
Admission: EM | Admit: 2022-12-17 | Discharge: 2022-12-21 | DRG: 418 | Disposition: A | Discharge status: Home / Self Care | Payer: BC Managed Care – PPO | Attending: Internal Medicine | Admitting: Internal Medicine

## 2022-12-17 ENCOUNTER — Ambulatory Visit (INDEPENDENT_AMBULATORY_CARE_PROVIDER_SITE_OTHER)
Admit: 2022-12-17 | Discharge: 2022-12-17 | Disposition: A | Discharge status: Home / Self Care | Payer: BC Managed Care – PPO | Attending: Emergency Medicine | Admitting: Emergency Medicine

## 2022-12-17 ENCOUNTER — Encounter: Payer: Self-pay | Admitting: Emergency Medicine

## 2022-12-17 ENCOUNTER — Other Ambulatory Visit: Payer: Self-pay

## 2022-12-17 ENCOUNTER — Ambulatory Visit
Admission: EM | Admit: 2022-12-17 | Discharge: 2022-12-17 | Disposition: A | Discharge status: Other Acute Inpatient Hospital | Payer: BC Managed Care – PPO | Source: Home / Self Care

## 2022-12-17 DIAGNOSIS — N139 Obstructive and reflux uropathy, unspecified: Secondary | ICD-10-CM | POA: Diagnosis present

## 2022-12-17 DIAGNOSIS — N390 Urinary tract infection, site not specified: Secondary | ICD-10-CM | POA: Clinically undetermined

## 2022-12-17 DIAGNOSIS — Z91041 Radiographic dye allergy status: Secondary | ICD-10-CM | POA: Diagnosis not present

## 2022-12-17 DIAGNOSIS — Z8249 Family history of ischemic heart disease and other diseases of the circulatory system: Secondary | ICD-10-CM

## 2022-12-17 DIAGNOSIS — Z79899 Other long term (current) drug therapy: Secondary | ICD-10-CM

## 2022-12-17 DIAGNOSIS — Z8616 Personal history of COVID-19: Secondary | ICD-10-CM

## 2022-12-17 DIAGNOSIS — N179 Acute kidney failure, unspecified: Secondary | ICD-10-CM | POA: Insufficient documentation

## 2022-12-17 DIAGNOSIS — Z9842 Cataract extraction status, left eye: Secondary | ICD-10-CM | POA: Diagnosis not present

## 2022-12-17 DIAGNOSIS — K8067 Calculus of gallbladder and bile duct with acute and chronic cholecystitis with obstruction: Secondary | ICD-10-CM | POA: Diagnosis present

## 2022-12-17 DIAGNOSIS — L299 Pruritus, unspecified: Secondary | ICD-10-CM | POA: Diagnosis present

## 2022-12-17 DIAGNOSIS — E785 Hyperlipidemia, unspecified: Secondary | ICD-10-CM | POA: Diagnosis present

## 2022-12-17 DIAGNOSIS — E871 Hypo-osmolality and hyponatremia: Secondary | ICD-10-CM | POA: Diagnosis present

## 2022-12-17 DIAGNOSIS — K8001 Calculus of gallbladder with acute cholecystitis with obstruction: Secondary | ICD-10-CM

## 2022-12-17 DIAGNOSIS — Z85820 Personal history of malignant melanoma of skin: Secondary | ICD-10-CM

## 2022-12-17 DIAGNOSIS — E669 Obesity, unspecified: Secondary | ICD-10-CM | POA: Diagnosis present

## 2022-12-17 DIAGNOSIS — I10 Essential (primary) hypertension: Secondary | ICD-10-CM | POA: Diagnosis present

## 2022-12-17 DIAGNOSIS — K8050 Calculus of bile duct without cholangitis or cholecystitis without obstruction: Secondary | ICD-10-CM | POA: Diagnosis present

## 2022-12-17 DIAGNOSIS — E860 Dehydration: Secondary | ICD-10-CM | POA: Diagnosis present

## 2022-12-17 DIAGNOSIS — R160 Hepatomegaly, not elsewhere classified: Secondary | ICD-10-CM | POA: Diagnosis present

## 2022-12-17 DIAGNOSIS — Z6835 Body mass index (BMI) 35.0-35.9, adult: Secondary | ICD-10-CM

## 2022-12-17 DIAGNOSIS — Z9841 Cataract extraction status, right eye: Secondary | ICD-10-CM

## 2022-12-17 DIAGNOSIS — Z85828 Personal history of other malignant neoplasm of skin: Secondary | ICD-10-CM

## 2022-12-17 DIAGNOSIS — K8046 Calculus of bile duct with acute and chronic cholecystitis without obstruction: Secondary | ICD-10-CM | POA: Diagnosis not present

## 2022-12-17 LAB — LIPASE, BLOOD
Lipase: 65 U/L — ABNORMAL HIGH (ref 11–51)
Lipase: 62 U/L — ABNORMAL HIGH (ref 11–51)

## 2022-12-17 LAB — COMPREHENSIVE METABOLIC PANEL
ALT: 335 U/L — ABNORMAL HIGH (ref 0–44)
ALT: 350 U/L — ABNORMAL HIGH (ref 0–44)
AST: 172 U/L — ABNORMAL HIGH (ref 15–41)
AST: 183 U/L — ABNORMAL HIGH (ref 15–41)
Albumin: 3.8 g/dL (ref 3.5–5.0)
Albumin: 4.3 g/dL (ref 3.5–5.0)
Alkaline Phosphatase: 322 U/L — ABNORMAL HIGH (ref 38–126)
Alkaline Phosphatase: 349 U/L — ABNORMAL HIGH (ref 38–126)
Anion gap: 9 (ref 5–15)
Anion gap: 15 (ref 5–15)
BUN: 25 mg/dL — ABNORMAL HIGH (ref 6–20)
BUN: 26 mg/dL — ABNORMAL HIGH (ref 6–20)
CO2: 25 mmol/L (ref 22–32)
CO2: 20 mmol/L — ABNORMAL LOW (ref 22–32)
Calcium: 9.6 mg/dL (ref 8.9–10.3)
Calcium: 10.2 mg/dL (ref 8.9–10.3)
Chloride: 99 mmol/L (ref 98–111)
Chloride: 99 mmol/L (ref 98–111)
Creatinine, Ser: 1.3 mg/dL — ABNORMAL HIGH (ref 0.44–1.00)
Creatinine, Ser: 1.29 mg/dL — ABNORMAL HIGH (ref 0.44–1.00)
GFR, Estimated: 47 mL/min — ABNORMAL LOW (ref >60)
GFR, Estimated: 48 mL/min — ABNORMAL LOW (ref >60)
Glucose, Bld: 93 mg/dL (ref 70–99)
Glucose, Bld: 101 mg/dL — ABNORMAL HIGH (ref 70–99)
Potassium: 4.3 mmol/L (ref 3.5–5.1)
Potassium: 3.8 mmol/L (ref 3.5–5.1)
Sodium: 133 mmol/L — ABNORMAL LOW (ref 135–145)
Sodium: 134 mmol/L — ABNORMAL LOW (ref 135–145)
Total Bilirubin: 2.9 mg/dL — ABNORMAL HIGH (ref 0.3–1.2)
Total Bilirubin: 3.2 mg/dL — ABNORMAL HIGH (ref 0.3–1.2)
Total Protein: 8.1 g/dL (ref 6.5–8.1)
Total Protein: 8.6 g/dL — ABNORMAL HIGH (ref 6.5–8.1)

## 2022-12-17 LAB — CBC
HCT: 46.6 % — ABNORMAL HIGH (ref 36.0–46.0)
HCT: 46.9 % — ABNORMAL HIGH (ref 36.0–46.0)
Hemoglobin: 15.3 g/dL — ABNORMAL HIGH (ref 12.0–15.0)
Hemoglobin: 15.1 g/dL — ABNORMAL HIGH (ref 12.0–15.0)
MCH: 28.1 pg (ref 26.0–34.0)
MCH: 27.7 pg (ref 26.0–34.0)
MCHC: 32.8 g/dL (ref 30.0–36.0)
MCHC: 32.2 g/dL (ref 30.0–36.0)
MCV: 85.5 fL (ref 80.0–100.0)
MCV: 85.9 fL (ref 80.0–100.0)
Platelets: 365 10*3/uL (ref 150–400)
Platelets: 371 10*3/uL (ref 150–400)
RBC: 5.45 MIL/uL — ABNORMAL HIGH (ref 3.87–5.11)
RBC: 5.46 MIL/uL — ABNORMAL HIGH (ref 3.87–5.11)
RDW: 13.2 % (ref 11.5–15.5)
RDW: 13.2 % (ref 11.5–15.5)
WBC: 10.1 10*3/uL (ref 4.0–10.5)
WBC: 9.9 10*3/uL (ref 4.0–10.5)
nRBC: 0 % (ref 0.0–0.2)
nRBC: 0 % (ref 0.0–0.2)

## 2022-12-17 MED ORDER — DEXTROSE-SODIUM CHLORIDE 5-0.45 % IV SOLN: INTRAVENOUS | Status: DC

## 2022-12-17 MED ORDER — ENOXAPARIN SODIUM 40 MG/0.4ML IJ SOSY
40.0000 mg | PREFILLED_SYRINGE | INTRAMUSCULAR | Status: DC
  Administered 2022-12-17: 40 mg via SUBCUTANEOUS
  Filled 2022-12-17 (×2): qty 0.4

## 2022-12-17 MED ORDER — SODIUM CHLORIDE 0.9 % IV SOLN
12.5000 mg | Freq: Once | INTRAVENOUS | Status: AC
  Administered 2022-12-17: 12.5 mg via INTRAVENOUS
  Filled 2022-12-17: qty 0.5

## 2022-12-17 MED ORDER — METOPROLOL SUCCINATE ER 25 MG PO TB24
25.0000 mg | ORAL_TABLET | Freq: Every day | ORAL | Status: DC
  Administered 2022-12-18 – 2022-12-21 (×3): 25 mg via ORAL
  Filled 2022-12-17 (×3): qty 1

## 2022-12-17 MED ORDER — SODIUM CHLORIDE 0.9 % IV BOLUS
1000.0000 mL | Freq: Once | INTRAVENOUS | Status: AC
  Administered 2022-12-17: 1000 mL via INTRAVENOUS

## 2022-12-17 MED ORDER — SODIUM CHLORIDE 0.9 % IV SOLN
12.5000 mg | Freq: Four times a day (QID) | INTRAVENOUS | Status: DC | PRN
  Administered 2022-12-18 (×2): 12.5 mg via INTRAVENOUS
  Filled 2022-12-17: qty 12.5
  Filled 2022-12-17: qty 0.5
  Filled 2022-12-17: qty 12.5

## 2022-12-17 MED ORDER — ONDANSETRON HCL 4 MG/2ML IJ SOLN
4.0000 mg | Freq: Four times a day (QID) | INTRAMUSCULAR | Status: DC | PRN
  Filled 2022-12-17: qty 2

## 2022-12-17 NOTE — Consult Note (Signed)
Patient ID: SACHE SANE, female   DOB: 14-Oct-1960, 62 y.o.   MRN: [id number]  HPI Kathryn Torres is a 62 y.o. female seen in consultation at the request of Dr. Ashok Pall, case d/w him in detail. She presents with 3-day history of epigastric pain that was severe sharp.  Also reports developing jaundice.  She also admits to nausea and some emesis.  No fevers no chills.  She did have eye ultrasound today showing evidence of choledocholithiasis.  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 Lab work showed increased BUN and creatinine and a total bilirubin of 3.2 with AST of 183 ALT of 350 and calling phosphatase 149. Ultrasound personally reviewed showing evidence of dilation of the common bile duct 9mm likely from choledocholithiasis.  There is also gallbladder thickening. Prior history of C-section.  Also prior history of breast biopsies by Dr Michela Pitcher.  HPI  Past Medical History:  Diagnosis Date   Hypertension    Lower respiratory tract infection due to COVID-19 virus 03/05/2019   melanoma 2006   lower back   Previous pregnancy with hemolysis, elevated liver enzymes, and low platelet (HELLP) syndrome, antepartum 2002   s/p c section     Past Surgical History:  Procedure Laterality Date   BREAST BIOPSY Right 2005   benign   BREAST EXCISIONAL BIOPSY Right 1998   CATARACT EXTRACTION BILATERAL W/ ANTERIOR VITRECTOMY Bilateral    Illene Bolus,  2017   CESAREAN SECTION  2002    Family History  Problem Relation Age of Onset   Cancer Mother        Breast and Lung Ca, social smoker    Hypertension Mother    Breast cancer Mother 59   Cancer Father        Lung Ca, smoker   Stroke Paternal Grandfather 27       CVA ,     Deep vein thrombosis Maternal Grandmother     Social History Social History   Tobacco Use   Smoking status: Never   Smokeless tobacco: Never  Vaping Use   Vaping Use: Never used  Substance Use Topics   Alcohol use: No   Drug use: No     Allergies  Allergen Reactions   Contrast Media [Iodinated Contrast Media]     No current facility-administered medications for this encounter.   Current Outpatient Medications  Medication Sig Dispense Refill   losartan (COZAAR) 50 MG tablet Take 1 tablet (50 mg total) by mouth daily. 90 tablet 1   metoprolol succinate (TOPROL-XL) 25 MG 24 hr tablet Take 1 tablet (25 mg total) by mouth daily. 90 tablet 2   cholecalciferol (VITAMIN D) 1000 units tablet Take 1,000 Units by mouth daily.     folic acid (FOLVITE) 1 MG tablet Take 1 tablet (1 mg total) by mouth daily. (Patient not taking: Reported on 12/17/2022) 90 tablet 1   ondansetron (ZOFRAN-ODT) 4 MG disintegrating tablet Take 1 tablet (4 mg total) by mouth every 8 (eight) hours as needed for nausea or vomiting. (Patient not taking: Reported on 12/17/2022) 20 tablet 0   triamcinolone cream (KENALOG) 0.1 % Apply 1 Application to affected area topically 2 (two) times daily. (Patient not taking: Reported on 12/17/2022) 60 g 1   vitamin B-12 (CYANOCOBALAMIN) 1000 MCG tablet Take 1 tablet (1,000 mcg total) by mouth daily. 90 tablet 3   Zoster Vaccine Adjuvanted [facility]) injection Inject into the muscle. (Patient not taking: Reported on 12/17/2022)  1 each 1     Review of Systems Full ROS  was asked and was negative except for the information on the HPI  Physical Exam Blood pressure 132/82, pulse (!) 111, temperature 98.1 F (36.7 C), temperature source Oral, resp. rate 18, height 5\' 3"  (1.6 m), weight 89.8 kg, last menstrual period 06/14/2016, SpO2 97 %. CONSTITUTIONAL: NAD. EYES: Pupils are equal, round, and reactive to light, Sclera icteric. EARS, NOSE, MOUTH AND THROAT: The oropharynx is clear. The oral mucosa is pink and moist. Hearing is intact to voice. LYMPH NODES:  Lymph nodes in the neck are normal. RESPIRATORY:  Lungs are clear. There is normal respiratory effort, with equal breath sounds bilaterally, and without pathologic use of  accessory muscles. CARDIOVASCULAR: Heart is regular without murmurs, gallops, or rubs. GI: The abdomen is  soft, minimal tenderness on epigastrium w/o peritonitis or rebound, . There are no palpable masses. There is no hepatosplenomegaly. There are normal bowel sounds  GU: Rectal deferred.   MUSCULOSKELETAL: Normal muscle strength and tone. No cyanosis or edema.   SKIN: Turgor is good and there are no pathologic skin lesions or ulcers. NEUROLOGIC: Motor and sensation is grossly normal. Cranial nerves are grossly intact. PSYCH:  Oriented to person, place and time. Affect is normal.  Data Reviewed  I have personally reviewed the patient's imaging, laboratory findings and medical records.    Assessment/Plan Biliary colic and choledocholithiasis. In need for ERCP and interval cholecystectomy. No evidence of cholangitis or peritonitis. We will plan to tentatively do robotic chole Thursday No need for emergent surgical interventions. The risks, benefits, complications, treatment options, and expected outcomes were discussed with the patient. The possibilities of bleeding, recurrent infection, finding a normal gallbladder, perforation of viscus organs, damage to surrounding structures, bile leak, abscess formation, needing a drain placed, the need for additional procedures, reaction to medication, pulmonary aspiration,  failure to diagnose a condition, the possible need to convert to an open procedure, and creating a complication requiring transfusion or operation were discussed with the patient. The patient and/or family concurred with the proposed plan, giving informed consent.  We will continue to follow her   Sterling Big, MD FACS General Surgeon 12/17/2022, 5:20 PM

## 2022-12-17 NOTE — ED Triage Notes (Signed)
Patient to ED via POV from UC for abd pain with N/V since Friday. States she had [place] that showed gallstones.

## 2022-12-17 NOTE — H&P (Addendum)
History and Physical    Kathryn Torres:[id number] DOB: 01-20-1961 DOA: 12/17/2022  PCP: Sherlene Shams, MD  Patient coming from: home   Chief Complaint: abdominal pain  HPI: Kathryn Torres is a 61 y.o. female with medical history significant for htn, obesity, and gallstones, presents with the above.  Hx biliary colic, gets the occasional attack but it is mild and has avoided surgery thus far. Several days ago developed severe epigastric abdominal pain that has come and gone, currently just a nagging pain. Also with nausea and nbnb emesis. Is passing stool and flatus, no diarrhea. No fever or chills. Went to an urgent care today where labs were abnormal and u/s consistent w/ choledocholithiasis, referred to our ER.     Review of Systems: As per HPI otherwise 10 point review of systems negative.    Past Medical History:  Diagnosis Date   Hypertension    Lower respiratory tract infection due to COVID-19 virus 03/05/2019   melanoma 2006   lower back   Previous pregnancy with hemolysis, elevated liver enzymes, and low platelet (HELLP) syndrome, antepartum 2002   s/p c section     Past Surgical History:  Procedure Laterality Date   BREAST BIOPSY Right 2005   benign   BREAST EXCISIONAL BIOPSY Right 1998   CATARACT EXTRACTION BILATERAL W/ ANTERIOR VITRECTOMY Bilateral    Illene Bolus,  2017   CESAREAN SECTION  2002     reports that she has never smoked. She has never used smokeless tobacco. She reports that she does not drink alcohol and does not use drugs.  Allergies  Allergen Reactions   Contrast Media [Iodinated Contrast Media]     Family History  Problem Relation Age of Onset   Cancer Mother        Breast and Lung Ca, social smoker    Hypertension Mother    Breast cancer Mother 58   Cancer Father        Lung Ca, smoker   Stroke Paternal Grandfather 25       CVA ,     Deep vein thrombosis Maternal Grandmother     Prior to Admission medications   Medication  Sig Start Date End Date Taking? Authorizing Provider  cholecalciferol (VITAMIN D) 1000 units tablet Take 1,000 Units by mouth daily.    [provider]  folic acid (FOLVITE) 1 MG tablet Take 1 tablet (1 mg total) by mouth daily. 01/15/22   Sherlene Shams, MD  losartan (COZAAR) 50 MG tablet Take 1 tablet (50 mg total) by mouth daily. 10/21/22 10/21/23  Sherlene Shams, MD  metoprolol succinate (TOPROL-XL) 25 MG 24 hr tablet Take 1 tablet (25 mg total) by mouth daily. 05/22/22 05/22/23  Sherlene Shams, MD  ondansetron (ZOFRAN-ODT) 4 MG disintegrating tablet Take 1 tablet (4 mg total) by mouth every 8 (eight) hours as needed for nausea or vomiting. 03/25/22   Margaretann Loveless, PA-C  triamcinolone cream (KENALOG) 0.1 % Apply 1 Application to affected area topically 2 (two) times daily. 09/26/22     vitamin B-12 (CYANOCOBALAMIN) 1000 MCG tablet Take 1 tablet (1,000 mcg total) by mouth daily. 01/15/22   Sherlene Shams, MD  Zoster Vaccine Adjuvanted [facility]) injection Inject into the muscle. 01/14/22       Physical Exam: Vitals:   12/17/22 1403 12/17/22 1404  BP: 132/82   Pulse: (!) 111   Resp: 18   Temp: 98.1 F (36.7 C)   TempSrc: Oral  SpO2: 97%   Weight:  89.8 kg  Height:  5\' 3"  (1.6 m)    Constitutional: No acute distress Head: Atraumatic Eyes: Conjunctiva clear ENM: Moist mucous membranes.  Respiratory: normal wob Cardiovascular: Regular rate and rhythm.   Abdomen: Non-tender, non-distended. No masses. No rebound or guarding. Positive bowel sounds. Musculoskeletal: No joint deformity upper and lower extremities.   Skin: No rashes, lesions, or ulcers.  Extremities: No peripheral edema. Palpable peripheral pulses. Neurologic: Alert, moving all 4 extremities. Psychiatric: Normal insight and judgement.   Labs on Admission: I have personally reviewed following labs and imaging studies  CBC: Recent Labs  Lab 12/17/22 1406  WBC 10.1  HGB 15.3*  HCT 46.6*  MCV 85.5   PLT 365   Basic Metabolic Panel: Recent Labs  Lab 12/17/22 1102 12/17/22 1406  NA 133* 134*  K 4.3 3.8  CL 99 99  CO2 25 20*  GLUCOSE 93 101*  BUN 25* 26*  CREATININE 1.30* 1.29*  CALCIUM 9.6 10.2   GFR: Estimated Creatinine Clearance: 50 mL/min (A) (by C-G formula based on SCr of 1.29 mg/dL (H)). Liver Function Tests: Recent Labs  Lab 12/17/22 1102 12/17/22 1406  AST 172* 183*  ALT 335* 350*  ALKPHOS 322* 349*  BILITOT 2.9* 3.2*  PROT 8.1 8.6*  ALBUMIN 3.8 4.3   Recent Labs  Lab 12/17/22 1102 12/17/22 1406  LIPASE 65* 62*   No results for input(s): "AMMONIA" in the last 168 hours. Coagulation Profile: No results for input(s): "INR", "PROTIME" in the last 168 hours. Cardiac Enzymes: No results for input(s): "CKTOTAL", "CKMB", "CKMBINDEX", "TROPONINI" in the last 168 hours. BNP (last 3 results) No results for input(s): "PROBNP" in the last 8760 hours. HbA1C: No results for input(s): "HGBA1C" in the last 72 hours. CBG: No results for input(s): "GLUCAP" in the last 168 hours. Lipid Profile: No results for input(s): "CHOL", "HDL", "LDLCALC", "TRIG", "CHOLHDL", "LDLDIRECT" in the last 72 hours. Thyroid Function Tests: No results for input(s): "TSH", "T4TOTAL", "FREET4", "T3FREE", "THYROIDAB" in the last 72 hours. Anemia Panel: No results for input(s): "VITAMINB12", "FOLATE", "FERRITIN", "TIBC", "IRON", "RETICCTPCT" in the last 72 hours. Urine analysis:    Component Value Date/Time   COLORURINE YELLOW 10/21/2019 0921   APPEARANCEUR CLEAR 10/21/2019 0921   LABSPEC 1.020 10/21/2019 0921   PHURINE 6.0 10/21/2019 0921   GLUCOSEU NEGATIVE 10/21/2019 0921   HGBUR NEGATIVE 10/21/2019 0921   BILIRUBINUR NEGATIVE 10/21/2019 0921   KETONESUR NEGATIVE 10/21/2019 0921   PROTEINUR NEGATIVE 03/05/2019 1113   UROBILINOGEN 0.2 10/21/2019 0921   NITRITE NEGATIVE 10/21/2019 0921   LEUKOCYTESUR NEGATIVE 10/21/2019 0921    Radiological Exams on Admission: US Abdomen  Complete  Result Date: 12/17/2022 CLINICAL DATA:  Abdominal pain, known gallstones EXAM: ABDOMEN ULTRASOUND COMPLETE COMPARISON:  03/05/2019 CT abdomen pelvis FINDINGS: Gallbladder: Thickening of the gallbladder wall, which measures 4 mm, just above the upper limit of normal. No pericholecystic fluid. No sonographic Murphy sign noted by sonographer. Multiple calculi, the largest of which measures up to 13 mm. Common bile duct: Diameter: 9 mm within the liver, above the upper limit of normal. Intrahepatic biliary ductal dilatation. Possible stone in the common bile duct near the pancreatic head, at which point the duct measures up to 21 mm. Liver: No focal lesion identified. Within normal limits in parenchymal echogenicity. Portal vein is patent on color Doppler imaging with normal direction of blood flow towards the liver. IVC: No abnormality visualized. Pancreas: Visualized portion unremarkable. Spleen: Size and appearance within normal  limits. Right Kidney: Length: 10.4 cm. Echogenicity within normal limits. No mass or hydronephrosis visualized. Left Kidney: Length: 11.9 cm. Echogenicity within normal limits. No mass or hydronephrosis visualized. Abdominal aorta: No aneurysm visualized. Other findings: None. IMPRESSION: 1. Likely obstructing stone in the common bile duct near the pancreatic head, with intrahepatic and extrahepatic biliary ductal dilatation. A CT abdomen pelvis with contrast is recommended for further evaluation. 2. Cholelithiasis with gallbladder wall thickening, possibly related to the aforementioned obstruction. No pericholecystic fluid or sonographic Murphy sign. These results will be called to the ordering clinician or representative by the Radiologist Assistant, and communication documented in the PACS or [employer]. Electronically Signed   By: Wiliam Ke M.D.   On: 12/17/2022 13:15      Assessment/Plan Principal Problem:   Choledocholithiasis Active Problems:    Hypertension   Obesity (BMI 30-39.9)    # Choledocholithiasis # Elevated LFTs Symptomatic, with elevated LFTs and mildly elevated lipase. Large obstructing stone in the CBD near the pancreatic head with biliary ductal dilatation. Does not appear to have cholecystitis or cholangitis. Possible pancreatitis given location of pain though symptoms very mild currently. - holding on abx - npo - fluids - GI to see, tentative plan for ERCP tomorrow - gen surg ([place]), will see, per secure chat communication, tentative plan for cholecystectomy 6/27  # AKI Mild, cr 1.29 from baseline of around one likely 2/2 dehydration from reduced po - fluids - trend  # HTN Here bp wnl - cont home metop, holding remainder of home meds  # Obesity noted  DVT prophylaxis: lovenox Code Status: full  Family Communication: husband at bedside  Consults called: GI   Level of care: Med-Surg Status is: Inpatient Remains inpatient appropriate because: severity of illness    Silvano Bilis MD [facility] Pager [phone]  If 7PM-7AM, please contact night-coverage [email or web address] Password [facility]  12/17/2022, 4:19 PM

## 2022-12-17 NOTE — Telephone Encounter (Signed)
Pt is currently at [facility] in [place].

## 2022-12-17 NOTE — ED Triage Notes (Signed)
Pt presents with abdominal pain, nausea and vomiting after eating that started on 12/13/22. The next day her abdominal pain subsided, but she continues to have nausea and vomiting. She has tried zofran and pepto bismol for her symptoms.

## 2022-12-17 NOTE — Telephone Encounter (Signed)
Patient states she has been experiencing abdominal pain, nausea, vomiting since Friday (12/13/2022).  We do not have any in-office visits available today.  Patient declined other  locations.  Patient states she believes she will need to see someone today in person or she may have to go to the ED.  Patient declined offer to speak with Access Nurse (patient is a Engineer, civil (consulting)).  Patient states she will go to Urgent Care.

## 2022-12-17 NOTE — ED Notes (Signed)
Patient is being discharged from the Urgent Care and sent to the Emergency Department via POV . Per Becky Augusta, NP, patient is in need of higher level of care due to Calculus of gallbladder with acute cholecystitis and obstruction and AKI (acute kidney injury). Patient is aware and verbalizes understanding of plan of care.  Vitals:   12/17/22 1029  BP: 123/81  Pulse: 93  Resp: 16  SpO2: 98%

## 2022-12-17 NOTE — ED Provider Notes (Signed)
[facility] Provider Note    Event Date/Time   First MD Initiated Contact with Patient 12/17/22 1507     (approximate)   History   Chief Complaint Abdominal Pain   HPI  Kathryn Torres is a 62 y.o. female with past medical history of hypertension, hyperlipidemia, and gallstones who presents to the ED complaining of abdominal pain.  Patient reports that she has had waxing and waning pain in her epigastrium for the past 4 days.  Pain initially seem to come on after she ate dinner 4 days ago, has worsened whenever she attempts to eat since then.  It has also been associated with nausea and multiple episodes of vomiting, patient has been unable to keep down much solid food over the past 4 days.  She reports tolerating small sips of water and ginger ale, allowing her to take her medications.  She does describe subjective fevers and chills, but has not taken her temperature.  She is initially evaluated at urgent care, referred to the ED due to concern for stone in her common bile duct.     Physical Exam   Triage Vital Signs: ED Triage Vitals  Enc Vitals Group     BP 12/17/22 1403 132/82     Pulse Rate 12/17/22 1403 (!) 111     Resp 12/17/22 1403 18     Temp 12/17/22 1403 98.1 F (36.7 C)     Temp Source 12/17/22 1403 Oral     SpO2 12/17/22 1403 97 %     Weight 12/17/22 1404 198 lb (89.8 kg)     Height 12/17/22 1404 5\' 3"  (1.6 m)     Head Circumference --      Peak Flow --      Pain Score 12/17/22 1404 2     Pain Loc --      Pain Edu? --      Excl. in GC? --     Most recent vital signs: Vitals:   12/17/22 1403  BP: 132/82  Pulse: (!) 111  Resp: 18  Temp: 98.1 F (36.7 C)  SpO2: 97%    Constitutional: Alert and oriented. Eyes: Conjunctivae are normal. Head: Atraumatic. Nose: No congestion/rhinnorhea. Mouth/Throat: Mucous membranes are moist.  Cardiovascular: Normal rate, regular rhythm. Grossly normal heart sounds.  2+ radial pulses  bilaterally. Respiratory: Normal respiratory effort.  No retractions. Lungs CTAB. Gastrointestinal: Soft and tender to palpation in the epigastrium with no rebound or guarding. No distention. Musculoskeletal: No lower extremity tenderness nor edema.  Neurologic:  Normal speech and language. No gross focal neurologic deficits are appreciated.    ED Results / Procedures / Treatments   Labs (all labs ordered are listed, but only abnormal results are displayed) Labs Reviewed  LIPASE, BLOOD - Abnormal; Notable for the following components:      Result Value   Lipase 62 (*)    All other components within normal limits  COMPREHENSIVE METABOLIC PANEL - Abnormal; Notable for the following components:   Sodium 134 (*)    CO2 20 (*)    Glucose, Bld 101 (*)    BUN 26 (*)    Creatinine, Ser 1.29 (*)    Total Protein 8.6 (*)    AST 183 (*)    ALT 350 (*)    Alkaline Phosphatase 349 (*)    Total Bilirubin 3.2 (*)    GFR, Estimated 48 (*)    All other components within normal limits  CBC - Abnormal; Notable  for the following components:   RBC 5.45 (*)    Hemoglobin 15.3 (*)    HCT 46.6 (*)    All other components within normal limits  URINALYSIS, ROUTINE W REFLEX MICROSCOPIC    PROCEDURES:  Critical Care performed: No  Procedures   MEDICATIONS ORDERED IN ED: Medications  sodium chloride 0.9 % bolus 1,000 mL (has no administration in time range)  promethazine (PHENERGAN) 12.5 mg in sodium chloride 0.9 % 50 mL IVPB (has no administration in time range)     IMPRESSION / MDM / ASSESSMENT AND PLAN / ED COURSE  I reviewed the triage vital signs and the nursing notes.                              62 y.o. female with past medical history of hypertension, hyperlipidemia, and cholelithiasis who presents to the ED with 4 days of waxing and waning pain in her epigastrium associated with nausea and vomiting.  Patient's presentation is most consistent with acute presentation with  potential threat to life or bodily function.  Differential diagnosis includes, but is not limited to, cholecystitis, biliary colic, choledocholithiasis, hepatitis, pancreatitis, gastritis, GERD.  Patient well-appearing and in no acute distress, vital signs remarkable for tachycardia but otherwise reassuring.  Her abdomen is soft with some tenderness in her epigastrium but no rebound or guarding.  Labs without significant anemia or leukocytosis, patient with mild AKI likely due to dehydration, no acute electrolyte abnormality noted.  She does have significant transaminitis and elevation in bilirubin and alkaline phosphatase, consistent with choledocholithiasis.  Ultrasound obtained prior to arrival at urgent care and is concerning for stone lodged at the CBD with associated dilation of the CBD.  Findings consistent with choledocholithiasis, low suspicion for cholangitis at this time.  Findings reviewed with Dr. Servando Snare of GI, who recommends admission to the hospitalist service and will tentatively plan on ERCP tomorrow.  Patient declines pain medication, will treat symptomatically with IV fluids and IV Phenergan.  Case discussed with hospitalist for admission.      FINAL CLINICAL IMPRESSION(S) / ED DIAGNOSES   Final diagnoses:  Choledocholithiasis     Rx / DC Orders   ED Discharge Orders     None        Note:  This document was prepared using Dragon voice recognition software and may include unintentional dictation errors.   Chesley Noon, MD 12/17/22 [phone]

## 2022-12-17 NOTE — ED Provider Notes (Signed)
[facility]    CSN: [id number] Arrival date & time: 12/17/22  4098      History   Chief Complaint Chief Complaint  Patient presents with   Abdominal Pain   Emesis   Nausea    HPI Kathryn Torres is a 62 y.o. female.   HPI  62 year old female with a past medical history significant for hypertension, malignant melanoma, vitamin D deficiency, known gallstones, and transaminitis presenting for evaluation of epigastric and left upper quadrant pain that started 4 days ago.  She reports that it began after eating and her abdominal pain in the epigastric area was 10/10 whereas her left upper quadrant pain was 8/10.  The pain has largely subsided but she is still having nausea and vomiting.  She has been able to take small sips of fluids and had to saltine crackers yesterday.  When asked about fevers she states she did not measure fever but she did feel like she had a fever that was associated with chills.  She also reports that her daughter says she has a yellowish tent to her skin and she has been having some itching.  She denies any diarrhea or constipation.  Past Medical History:  Diagnosis Date   Hypertension    Lower respiratory tract infection due to COVID-19 virus 03/05/2019   melanoma 2006   lower back   Previous pregnancy with hemolysis, elevated liver enzymes, and low platelet (HELLP) syndrome, antepartum 2002   s/p c section     Patient Active Problem List   Diagnosis Date Noted   Hyperlipidemia, mixed 01/15/2022   Folic acid deficiency 01/15/2022   Vitamin B12 deficiency 12/03/2020   Renal cyst, acquired, left 10/23/2019   Hospital discharge follow-up 03/17/2019   Allergic rhinitis 03/30/2017   Vitamin D deficiency 05/25/2013   Visit for preventive health examination 05/18/2012   Symptomatic cholelithiasis 05/18/2012   Obesity (BMI 30-39.9) 11/18/2011   Previous pregnancy with hemolysis, elevated liver enzymes, and low platelet (HELLP) syndrome,  antepartum    Hypertension    History of melanoma excision     Past Surgical History:  Procedure Laterality Date   BREAST BIOPSY Right 2005   benign   BREAST EXCISIONAL BIOPSY Right 1998   CATARACT EXTRACTION BILATERAL W/ ANTERIOR VITRECTOMY Bilateral    Illene Bolus,  2017   CESAREAN SECTION  2002    OB History   No obstetric history on file.      Home Medications    Prior to Admission medications   Medication Sig Start Date End Date Taking? Authorizing Provider  cholecalciferol (VITAMIN D) 1000 units tablet Take 1,000 Units by mouth daily.    [provider]  folic acid (FOLVITE) 1 MG tablet Take 1 tablet (1 mg total) by mouth daily. 01/15/22   Sherlene Shams, MD  losartan (COZAAR) 50 MG tablet Take 1 tablet (50 mg total) by mouth daily. 10/21/22 10/21/23  Sherlene Shams, MD  metoprolol succinate (TOPROL-XL) 25 MG 24 hr tablet Take 1 tablet (25 mg total) by mouth daily. 05/22/22 05/22/23  Sherlene Shams, MD  ondansetron (ZOFRAN-ODT) 4 MG disintegrating tablet Take 1 tablet (4 mg total) by mouth every 8 (eight) hours as needed for nausea or vomiting. 03/25/22   Margaretann Loveless, PA-C  triamcinolone cream (KENALOG) 0.1 % Apply 1 Application to affected area topically 2 (two) times daily. 09/26/22     vitamin B-12 (CYANOCOBALAMIN) 1000 MCG tablet Take 1 tablet (1,000 mcg total) by mouth daily. 01/15/22  Sherlene Shams, MD  Zoster Vaccine Adjuvanted [facility]) injection Inject into the muscle. 01/14/22       Family History Family History  Problem Relation Age of Onset   Cancer Mother        Breast and Lung Ca, social smoker    Hypertension Mother    Breast cancer Mother 58   Cancer Father        Lung Ca, smoker   Stroke Paternal Grandfather 34       CVA ,     Deep vein thrombosis Maternal Grandmother     Social History Social History   Tobacco Use   Smoking status: Never   Smokeless tobacco: Never  Vaping Use   Vaping Use: Never used  Substance Use  Topics   Alcohol use: No   Drug use: No     Allergies   Contrast media [iodinated contrast media]   Review of Systems Review of Systems  Constitutional:  Positive for chills and fever.  Gastrointestinal:  Positive for abdominal pain, nausea and vomiting. Negative for constipation and diarrhea.  Skin:  Positive for color change.       Yellowing of skin and eyes.  Neurological:        Itching     Physical Exam Triage Vital Signs ED Triage Vitals  Enc Vitals Group     BP      Pulse      Resp      Temp      Temp src      SpO2      Weight      Height      Head Circumference      Peak Flow      Pain Score      Pain Loc      Pain Edu?      Excl. in GC?    No data found.  Updated Vital Signs BP 123/81 (BP Location: Left Arm)   Pulse 93   Resp 16   LMP 06/14/2016 (Exact Date)   SpO2 98%   Visual Acuity Right Eye Distance:   Left Eye Distance:   Bilateral Distance:    Right Eye Near:   Left Eye Near:    Bilateral Near:     Physical Exam Vitals and nursing note reviewed.  Constitutional:      Appearance: Normal appearance. She is not ill-appearing.  HENT:     Head: Normocephalic and atraumatic.  Eyes:     General: Scleral icterus present.     Extraocular Movements: Extraocular movements intact.     Pupils: Pupils are equal, round, and reactive to light.  Cardiovascular:     Rate and Rhythm: Normal rate and regular rhythm.     Pulses: Normal pulses.     Heart sounds: Normal heart sounds. No murmur heard.    No friction rub. No gallop.  Pulmonary:     Effort: Pulmonary effort is normal.     Breath sounds: Normal breath sounds. No wheezing, rhonchi or rales.  Abdominal:     Palpations: Abdomen is soft.     Tenderness: There is abdominal tenderness. There is no guarding or rebound.  Skin:    General: Skin is warm and dry.     Capillary Refill: Capillary refill takes less than 2 seconds.     Coloration: Skin is jaundiced.  Neurological:     General:  No focal deficit present.     Mental Status: She is alert and oriented  to person, place, and time.      UC Treatments / Results  Labs (all labs ordered are listed, but only abnormal results are displayed) Labs Reviewed  COMPREHENSIVE METABOLIC PANEL - Abnormal; Notable for the following components:      Result Value   Sodium 133 (*)    BUN 25 (*)    Creatinine, Ser 1.30 (*)    AST 172 (*)    ALT 335 (*)    Alkaline Phosphatase 322 (*)    Total Bilirubin 2.9 (*)    GFR, Estimated 47 (*)    All other components within normal limits  LIPASE, BLOOD - Abnormal; Notable for the following components:   Lipase 65 (*)    All other components within normal limits  CBC WITH DIFFERENTIAL/PLATELET    EKG   Radiology US Abdomen Complete  Result Date: 12/17/2022 CLINICAL DATA:  Abdominal pain, known gallstones EXAM: ABDOMEN ULTRASOUND COMPLETE COMPARISON:  03/05/2019 CT abdomen pelvis FINDINGS: Gallbladder: Thickening of the gallbladder wall, which measures 4 mm, just above the upper limit of normal. No pericholecystic fluid. No sonographic Murphy sign noted by sonographer. Multiple calculi, the largest of which measures up to 13 mm. Common bile duct: Diameter: 9 mm within the liver, above the upper limit of normal. Intrahepatic biliary ductal dilatation. Possible stone in the common bile duct near the pancreatic head, at which point the duct measures up to 21 mm. Liver: No focal lesion identified. Within normal limits in parenchymal echogenicity. Portal vein is patent on color Doppler imaging with normal direction of blood flow towards the liver. IVC: No abnormality visualized. Pancreas: Visualized portion unremarkable. Spleen: Size and appearance within normal limits. Right Kidney: Length: 10.4 cm. Echogenicity within normal limits. No mass or hydronephrosis visualized. Left Kidney: Length: 11.9 cm. Echogenicity within normal limits. No mass or hydronephrosis visualized. Abdominal aorta: No  aneurysm visualized. Other findings: None. IMPRESSION: 1. Likely obstructing stone in the common bile duct near the pancreatic head, with intrahepatic and extrahepatic biliary ductal dilatation. A CT abdomen pelvis with contrast is recommended for further evaluation. 2. Cholelithiasis with gallbladder wall thickening, possibly related to the aforementioned obstruction. No pericholecystic fluid or sonographic Murphy sign. These results will be called to the ordering clinician or representative by the Radiologist Assistant, and communication documented in the PACS or [employer]. Electronically Signed   By: Wiliam Ke M.D.   On: 12/17/2022 13:15    Procedures Procedures (including critical care time)  Medications Ordered in UC Medications - No data to display  Initial Impression / Assessment and Plan / UC Course  I have reviewed the triage vital signs and the nursing notes.  Pertinent labs & imaging results that were available during my care of the patient were reviewed by me and considered in my medical decision making (see chart for details).   Patient is a pleasant 62 year old female presenting for evaluation of epigastric and left upper quadrant abdominal pain that started 4 days ago after eating.  Patient does have a history of known gallstones dating back to 2013.  She still has her gallbladder as she states she never took the time to follow-up with the surgeon and have it out.  Main reason she came in today is because her nausea and vomiting has persisted and her daughter told her that she had a yellowish tent to her skin.  On exam patient does have mild scleral icterus bilaterally as well as a pale yellow tent to her skin.  She has  been experiencing pruritus.  Abdomen is protuberant but soft she does have epigastric and left upper quadrant tenderness but no right upper quadrant or lower abdominal tenderness.  No guarding or rebound.  She does have a history of transaminitis in addition to  her known gallstones.  I will order an ultrasound of her abdomen complete to evaluate both her pancreas and her gallbladder.  Additionally I will order CBC, CMP, and lipase.  With her history of transaminitis and also malignant melanoma there is concern for pancreatitis or possible pancreatic mass causing the patient's symptoms.  Patient is currently NPO.  CMP shows hyponatremia the sodium 133.  Potassium and chloride are normal at 4.3 noted on respectively.  BUN is elevated 25 with a creatinine of 1.30.  AST, ALT, alk phos, T. bili all elevated at 172, 335, 322, and 2.9 respectively.  Lipase elevated 65.  Radiology impression of abdominal ultrasound states likely obstructing stone in the common bile duct near the pancreatic head with intrahepatic and extrahepatic biliary ductal dilation.  Recommend CT abdomen and pelvis with contrast.  Cholelithiasis with gallbladder wall thickening possibility to the aforementioned obstruction.  No pericholecystic fluid or sonographic Murphy sign.  I discussed the findings with the patient and advised her that she needs to go to the emergency department.  She has elected to go to [facility] via POV.  I will discharge with diagnosis of acute cholecystitis and AKI.   Final Clinical Impressions(s) / UC Diagnoses   Final diagnoses:  Calculus of gallbladder with acute cholecystitis and obstruction  AKI (acute kidney injury) [facility])     Discharge Instructions      Please go to the emergency department at [facility] to be evaluated by surgery for your gallbladder disease.  Nothing to eat or drink until after you have been evaluated by surgery in the ER.     ED Prescriptions   None    PDMP not reviewed this encounter.   Becky Augusta, NP 12/17/22 1327

## 2022-12-17 NOTE — Discharge Instructions (Addendum)
Please go to the emergency department at [facility] to be evaluated by surgery for your gallbladder disease.  Nothing to eat or drink until after you have been evaluated by surgery in the ER.

## 2022-12-18 ENCOUNTER — Inpatient Hospital Stay: Payer: BC Managed Care – PPO | Admitting: Certified Registered"

## 2022-12-18 ENCOUNTER — Encounter
Admission: EM | Disposition: A | Discharge status: Home / Self Care | Payer: Self-pay | Source: Home / Self Care | Attending: Internal Medicine

## 2022-12-18 ENCOUNTER — Inpatient Hospital Stay: Payer: BC Managed Care – PPO

## 2022-12-18 ENCOUNTER — Encounter: Payer: Self-pay | Admitting: Obstetrics and Gynecology

## 2022-12-18 DIAGNOSIS — N179 Acute kidney failure, unspecified: Secondary | ICD-10-CM | POA: Diagnosis present

## 2022-12-18 DIAGNOSIS — K8050 Calculus of bile duct without cholangitis or cholecystitis without obstruction: Secondary | ICD-10-CM | POA: Diagnosis not present

## 2022-12-18 HISTORY — PX: SPHINCTEROTOMY: SHX5279

## 2022-12-18 HISTORY — PX: REMOVAL OF STONES: SHX5545

## 2022-12-18 HISTORY — PX: BALLOON DILATION: SHX5330

## 2022-12-18 HISTORY — PX: ENDOSCOPIC RETROGRADE CHOLANGIOPANCREATOGRAPHY (ERCP) WITH PROPOFOL: SHX5810

## 2022-12-18 HISTORY — DX: Acute kidney failure, unspecified: N17.9

## 2022-12-18 LAB — TYPE AND SCREEN
ABO/RH(D): A NEG
Antibody Screen: NEGATIVE

## 2022-12-18 LAB — COMPREHENSIVE METABOLIC PANEL
ALT: 272 U/L — ABNORMAL HIGH (ref 0–44)
AST: 151 U/L — ABNORMAL HIGH (ref 15–41)
Albumin: 3.2 g/dL — ABNORMAL LOW (ref 3.5–5.0)
Alkaline Phosphatase: 229 U/L — ABNORMAL HIGH (ref 38–126)
Anion gap: 7 (ref 5–15)
BUN: 24 mg/dL — ABNORMAL HIGH (ref 6–20)
CO2: 25 mmol/L (ref 22–32)
Calcium: 9.1 mg/dL (ref 8.9–10.3)
Chloride: 106 mmol/L (ref 98–111)
Creatinine, Ser: 1.07 mg/dL — ABNORMAL HIGH (ref 0.44–1.00)
GFR, Estimated: 60 mL/min — ABNORMAL LOW (ref >60)
Glucose, Bld: 117 mg/dL — ABNORMAL HIGH (ref 70–99)
Potassium: 3.8 mmol/L (ref 3.5–5.1)
Sodium: 138 mmol/L (ref 135–145)
Total Bilirubin: 2.2 mg/dL — ABNORMAL HIGH (ref 0.3–1.2)
Total Protein: 6.4 g/dL — ABNORMAL LOW (ref 6.5–8.1)

## 2022-12-18 LAB — CBC
HCT: 36.9 % (ref 36.0–46.0)
Hemoglobin: 12.3 g/dL (ref 12.0–15.0)
MCH: 28.5 pg (ref 26.0–34.0)
MCHC: 33.3 g/dL (ref 30.0–36.0)
MCV: 85.6 fL (ref 80.0–100.0)
Platelets: 220 10*3/uL (ref 150–400)
RBC: 4.31 MIL/uL (ref 3.87–5.11)
RDW: 13 % (ref 11.5–15.5)
WBC: 5.5 10*3/uL (ref 4.0–10.5)
nRBC: 0 % (ref 0.0–0.2)

## 2022-12-18 LAB — HIV ANTIBODY (ROUTINE TESTING W REFLEX): HIV Screen 4th Generation wRfx: NONREACTIVE

## 2022-12-18 SURGERY — ENDOSCOPIC RETROGRADE CHOLANGIOPANCREATOGRAPHY (ERCP) WITH PROPOFOL
Anesthesia: General

## 2022-12-18 MED ORDER — LACTATED RINGERS IV SOLN: INTRAVENOUS | Status: DC

## 2022-12-18 MED ORDER — DICLOFENAC SUPPOSITORY 100 MG
100.0000 mg | RECTAL | Status: AC
  Administered 2022-12-18: 100 mg via RECTAL

## 2022-12-18 MED ORDER — PROPOFOL 10 MG/ML IV BOLUS
INTRAVENOUS | Status: AC
  Filled 2022-12-18: qty 20

## 2022-12-18 MED ORDER — DIPHENHYDRAMINE HCL 50 MG/ML IJ SOLN
INTRAMUSCULAR | Status: AC
  Filled 2022-12-18: qty 1

## 2022-12-18 MED ORDER — PROPOFOL 10 MG/ML IV BOLUS
INTRAVENOUS | Status: DC | PRN
  Administered 2022-12-18: 20 mg via INTRAVENOUS
  Administered 2022-12-18: 50 mg via INTRAVENOUS

## 2022-12-18 MED ORDER — DICLOFENAC SUPPOSITORY 100 MG
RECTAL | Status: AC
  Filled 2022-12-18: qty 1

## 2022-12-18 MED ORDER — GLYCOPYRROLATE 0.2 MG/ML IJ SOLN
INTRAMUSCULAR | Status: DC | PRN
  Administered 2022-12-18: .2 mg via INTRAVENOUS

## 2022-12-18 MED ORDER — PROPOFOL 500 MG/50ML IV EMUL
INTRAVENOUS | Status: DC | PRN
  Administered 2022-12-18: 120 ug/kg/min via INTRAVENOUS

## 2022-12-18 MED ORDER — DIPHENHYDRAMINE HCL 50 MG/ML IJ SOLN
50.0000 mg | Freq: Once | INTRAMUSCULAR | Status: AC
  Administered 2022-12-18: 50 mg via INTRAVENOUS

## 2022-12-18 MED ORDER — LIDOCAINE HCL (CARDIAC) PF 100 MG/5ML IV SOSY
PREFILLED_SYRINGE | INTRAVENOUS | Status: DC | PRN
  Administered 2022-12-18: 30 mg via INTRAVENOUS

## 2022-12-18 NOTE — Anesthesia Preprocedure Evaluation (Addendum)
Anesthesia Evaluation  Patient identified by MRN, date of birth, ID band Patient awake    Reviewed: Allergy & Precautions, H&P , NPO status , Patient's Chart, lab work & pertinent test results  Airway Mallampati: II  TM Distance: >3 FB Neck ROM: full    Dental no notable dental hx.    Pulmonary neg pulmonary ROS   Pulmonary exam normal        Cardiovascular hypertension, Normal cardiovascular exam     Neuro/Psych negative neurological ROS  negative psych ROS   GI/Hepatic Neg liver ROS,,,choledocholithiasis    Endo/Other  negative endocrine ROS    Renal/GU Renal diseaseAKI  negative genitourinary   Musculoskeletal   Abdominal   Peds  Hematology negative hematology ROS (+)   Anesthesia Other Findings Past Medical History: No date: Hypertension 03/05/2019: Lower respiratory tract infection due to COVID-19 virus 2006: melanoma     Comment:  lower back 2002: Previous pregnancy with hemolysis, elevated liver enzymes, and  low platelet (HELLP) syndrome, antepartum     Comment:  s/p c section   Past Surgical History: 2005: BREAST BIOPSY; Right     Comment:  benign 1998: BREAST EXCISIONAL BIOPSY; Right No date: CATARACT EXTRACTION BILATERAL W/ ANTERIOR VITRECTOMY;  Bilateral     Comment:  Illene Bolus,  2017 2002: CESAREAN SECTION  BMI    Body Mass Index: 35.07 kg/m      Reproductive/Obstetrics negative OB ROS                             Anesthesia Physical Anesthesia Plan  ASA: 2  Anesthesia Plan: General   Post-op Pain Management: Minimal or no pain anticipated   Induction: Intravenous  PONV Risk Score and Plan: Propofol infusion and TIVA  Airway Management Planned: Natural Airway  Additional Equipment:   Intra-op Plan:   Post-operative Plan:   Informed Consent: I have reviewed the patients History and Physical, chart, labs and discussed the procedure including the  risks, benefits and alternatives for the proposed anesthesia with the patient or authorized representative who has indicated his/her understanding and acceptance.     Dental Advisory Given  Plan Discussed with: CRNA and Surgeon  Anesthesia Plan Comments:         Anesthesia Quick Evaluation

## 2022-12-18 NOTE — Consult Note (Signed)
Midge Minium, MD [facility]  [address] [place], [place] [address] Phone: [phone] Fax : [phone]  Consultation  Referring Provider:     Dr. Denton Lank Primary Care Physician:  Sherlene Shams, MD Primary Gastroenterologist:     [facility]      Reason for Consultation:     Choledocholithiasis  Date of Admission:  12/17/2022 Date of Consultation:  12/18/2022         HPI:   Kathryn Torres is a 62 y.o. female who was admitted to the hospital yesterday with a history of hypertension hyperlipidemia and gallstones with report of abdominal pain that was waxing waning for the last 4 days.  The patient had also been having multiple episodes of vomiting with nausea and unable to keep much food down.  The patient then had imaging that showed:  IMPRESSION: 1. Likely obstructing stone in the common bile duct near the pancreatic head, with intrahepatic and extrahepatic biliary ductal dilatation. A CT abdomen pelvis with contrast is recommended for further evaluation. 2. Cholelithiasis with gallbladder wall thickening, possibly related to the aforementioned obstruction. No pericholecystic fluid or sonographic Murphy sign.  Patient's labs also showed:  Component     Latest Ref Rng 01/14/2022 12/17/2022 12/18/2022  Total Bilirubin     0.3 - 1.2 mg/dL 0.6  3.2 (H)  2.2 (H)   Total Bilirubin       2.9 (H)    Alkaline Phosphatase     38 - 126 U/L 106  349 (H)  229 (H)   Alkaline Phosphatase       322 (H)    AST     15 - 41 U/L 21  183 (H)  151 (H)   AST       172 (H)    ALT     0 - 44 U/L 22  350 (H)  272 (H)   ALT       335 (H)     Above findings I was called for an ERCP.  Past Medical History:  Diagnosis Date   Hypertension    Lower respiratory tract infection due to COVID-19 virus 03/05/2019   melanoma 2006   lower back   Previous pregnancy with hemolysis, elevated liver enzymes, and low platelet (HELLP) syndrome, antepartum 2002   s/p c section     Past  Surgical History:  Procedure Laterality Date   BREAST BIOPSY Right 2005   benign   BREAST EXCISIONAL BIOPSY Right 1998   CATARACT EXTRACTION BILATERAL W/ ANTERIOR VITRECTOMY Bilateral    Illene Bolus,  2017   CESAREAN SECTION  2002    Prior to Admission medications   Medication Sig Start Date End Date Taking? Authorizing Provider  losartan (COZAAR) 50 MG tablet Take 1 tablet (50 mg total) by mouth daily. 10/21/22 10/21/23 Yes Sherlene Shams, MD  metoprolol succinate (TOPROL-XL) 25 MG 24 hr tablet Take 1 tablet (25 mg total) by mouth daily. 05/22/22 05/22/23 Yes Sherlene Shams, MD  cholecalciferol (VITAMIN D) 1000 units tablet Take 1,000 Units by mouth daily.    [provider]  folic acid (FOLVITE) 1 MG tablet Take 1 tablet (1 mg total) by mouth daily. Patient not taking: Reported on 12/17/2022 01/15/22   Sherlene Shams, MD  ondansetron (ZOFRAN-ODT) 4 MG disintegrating tablet Take 1 tablet (4 mg total) by mouth every 8 (eight) hours as needed for nausea or vomiting. Patient not taking: Reported on 12/17/2022 03/25/22  Joycelyn Man M, PA-C  triamcinolone cream (KENALOG) 0.1 % Apply 1 Application to affected area topically 2 (two) times daily. Patient not taking: Reported on 12/17/2022 09/26/22     vitamin B-12 (CYANOCOBALAMIN) 1000 MCG tablet Take 1 tablet (1,000 mcg total) by mouth daily. 01/15/22   Sherlene Shams, MD  Zoster Vaccine Adjuvanted [facility]) injection Inject into the muscle. Patient not taking: Reported on 12/17/2022 01/14/22       Family History  Problem Relation Age of Onset   Cancer Mother        Breast and Lung Ca, social smoker    Hypertension Mother    Breast cancer Mother 41   Cancer Father        Lung Ca, smoker   Stroke Paternal Grandfather 6       CVA ,     Deep vein thrombosis Maternal Grandmother      Social History   Tobacco Use   Smoking status: Never   Smokeless tobacco: Never  Vaping Use   Vaping Use: Never used  Substance Use Topics    Alcohol use: No   Drug use: No    Allergies as of 12/17/2022 - Review Complete 12/17/2022  Allergen Reaction Noted   Contrast media [iodinated contrast media]  11/14/2011    Review of Systems:    All systems reviewed and negative except where noted in HPI.   Physical Exam:  Vital signs in last 24 hours: Temp:  [96.3 F (35.7 C)-98.3 F (36.8 C)] 96.3 F (35.7 C) (06/26 1322) Pulse Rate:  [76-111] 78 (06/26 1322) Resp:  [15-18] 15 (06/26 1322) BP: (112-146)/(51-82) 146/79 (06/26 1322) SpO2:  [97 %-100 %] 100 % (06/26 1322) Weight:  [89.8 kg] 89.8 kg (06/26 1322) Last BM Date : 12/18/22 General:   Pleasant, cooperative in NAD Head:  Normocephalic and atraumatic. Eyes:   No icterus.   Conjunctiva pink. PERRLA. Ears:  Normal auditory acuity. Neck:  Supple; no masses or thyroidomegaly Neurologic:  Alert and oriented x3;  grossly normal neurologically. Skin:  Intact without significant lesions or rashes. Cervical Nodes:  No significant cervical adenopathy. Psych:  Alert and cooperative. Normal affect.  LAB RESULTS: Recent Labs    12/17/22 1406 12/18/22 0633  WBC 9.9  10.1 5.5  HGB 15.1*  15.3* 12.3  HCT 46.9*  46.6* 36.9  PLT 371  365 220   BMET Recent Labs    12/17/22 1102 12/17/22 1406 12/18/22 0633  NA 133* 134* 138  K 4.3 3.8 3.8  CL 99 99 106  CO2 25 20* 25  GLUCOSE 93 101* 117*  BUN 25* 26* 24*  CREATININE 1.30* 1.29* 1.07*  CALCIUM 9.6 10.2 9.1   LFT Recent Labs    12/18/22 0633  PROT 6.4*  ALBUMIN 3.2*  AST 151*  ALT 272*  ALKPHOS 229*  BILITOT 2.2*   PT/INR No results for input(s): "LABPROT", "INR" in the last 72 hours.  STUDIES: US Abdomen Complete  Result Date: 12/17/2022 CLINICAL DATA:  Abdominal pain, known gallstones EXAM: ABDOMEN ULTRASOUND COMPLETE COMPARISON:  03/05/2019 CT abdomen pelvis FINDINGS: Gallbladder: Thickening of the gallbladder wall, which measures 4 mm, just above the upper limit of normal. No pericholecystic  fluid. No sonographic Murphy sign noted by sonographer. Multiple calculi, the largest of which measures up to 13 mm. Common bile duct: Diameter: 9 mm within the liver, above the upper limit of normal. Intrahepatic biliary ductal dilatation. Possible stone in the common bile duct near the pancreatic head, at  which point the duct measures up to 21 mm. Liver: No focal lesion identified. Within normal limits in parenchymal echogenicity. Portal vein is patent on color Doppler imaging with normal direction of blood flow towards the liver. IVC: No abnormality visualized. Pancreas: Visualized portion unremarkable. Spleen: Size and appearance within normal limits. Right Kidney: Length: 10.4 cm. Echogenicity within normal limits. No mass or hydronephrosis visualized. Left Kidney: Length: 11.9 cm. Echogenicity within normal limits. No mass or hydronephrosis visualized. Abdominal aorta: No aneurysm visualized. Other findings: None. IMPRESSION: 1. Likely obstructing stone in the common bile duct near the pancreatic head, with intrahepatic and extrahepatic biliary ductal dilatation. A CT abdomen pelvis with contrast is recommended for further evaluation. 2. Cholelithiasis with gallbladder wall thickening, possibly related to the aforementioned obstruction. No pericholecystic fluid or sonographic Murphy sign. These results will be called to the ordering clinician or representative by the Radiologist Assistant, and communication documented in the PACS or [employer]. Electronically Signed   By: Wiliam Ke M.D.   On: 12/17/2022 13:15      Impression / Plan:   Assessment: Principal Problem:   Choledocholithiasis Active Problems:   Hypertension   Obesity (BMI 30-39.9)   AKI (acute kidney injury) (HCC)   Kathryn Torres is a 62 y.o. y/o female with elevated liver enzymes and a dilated common bile duct consistent with the patient having a bile duct stone.  The patient has been made n.p.o. after midnight and her  allergy to contrast dye was reviewed with the patient which she stated was a local reaction in her arm that happened 30 years ago and it was not confirmed whether it was a issue with the IV or whether it was a true allergy.   Plan:  The patient has a week history of a rash on her arm when received steroids 30 years ago.  The patient will be premedicated with some Benadryl and will be set up for an ERCP for today.  The patient has been explained the risks of the procedure including pancreatitis failed procedure infection bleeding and death.  The patient and her husband agree to proceeding with the procedure.  Thank you for involving me in the care of this patient.      LOS: 1 day   Midge Minium, MD, [facility] 12/18/2022, 2:01 PM,  Pager [phone] 7am-5pm  Check AMION for 5pm -7am coverage and on weekends   Note: This dictation was prepared with Dragon dictation along with smaller phrase technology. Any transcriptional errors that result from this process are unintentional.

## 2022-12-18 NOTE — Assessment & Plan Note (Addendum)
Continue home metoprolol Hold losartan at d/c given soft diastolic BP's. --Monitor BP's at home.  --Pt given BP parameters to resume when safe.   --Close PCP follow up

## 2022-12-18 NOTE — Hospital Course (Signed)
HPI on admission 12/17/22 by Dr. Ashok Pall: "Kathryn Torres is a 62 y.o. female with medical history significant for htn, obesity, and gallstones, presents with the above.   Hx biliary colic, gets the occasional attack but it is mild and has avoided surgery thus far. Several days ago developed severe epigastric abdominal pain that has come and gone, currently just a nagging pain. Also with nausea and nbnb emesis. Is passing stool and flatus, no diarrhea. No fever or chills. Went to an urgent care today where labs were abnormal and u/s consistent w/ choledocholithiasis, referred to our ER. "  Consults: General surgery, GI

## 2022-12-18 NOTE — Assessment & Plan Note (Signed)
Body mass index is 35.07 kg/m. Complicates overall care and prognosis.  Recommend lifestyle modifications including physical activity and diet for weight loss and overall long-term health.  

## 2022-12-18 NOTE — Assessment & Plan Note (Addendum)
Mild, Cr 1.29 on admission likely due to dehydration and poor PO Intake. Treated with IV fluids & resolved. --Monitor BMP at follow up

## 2022-12-18 NOTE — Op Note (Signed)
[facility] Gastroenterology Patient Name: Kathryn Torres Procedure Date: 12/18/2022 2:00 PM MRN: [id number] Account #: [id number] Date of Birth: 1960/08/31 Admit Type: Inpatient Age: 62 Room: [facility] ENDO ROOM 4 Gender: Female Note Status: Finalized Instrument Name: [id number] Procedure:             ERCP Indications:           Common bile duct stone(s), Elevated liver enzymes Providers:             Midge Minium MD, MD Referring MD:          Duncan Dull, MD (Referring MD) Medicines:             Propofol per Anesthesia Complications:         No immediate complications. Procedure:             Pre-Anesthesia Assessment:                        - Prior to the procedure, a History and Physical was                         performed, and patient medications and allergies were                         reviewed. The patient's tolerance of previous                         anesthesia was also reviewed. The risks and benefits                         of the procedure and the sedation options and risks                         were discussed with the patient. All questions were                         answered, and informed consent was obtained. Prior                         Anticoagulants: The patient has taken no anticoagulant                         or antiplatelet agents. ASA Grade Assessment: II - A                         patient with mild systemic disease. After reviewing                         the risks and benefits, the patient was deemed in                         satisfactory condition to undergo the procedure.                        After obtaining informed consent, the scope was passed                         under direct vision. Throughout the procedure, the  patient's blood pressure, pulse, and oxygen                         saturations were monitored continuously. The                         Duodenoscope was introduced through the mouth,  and                         used to inject contrast into and used to cannulate the                         bile duct. The ERCP was accomplished without                         difficulty. The patient tolerated the procedure well. Findings:      The scout film was normal. The esophagus was successfully intubated       under direct vision. The scope was advanced to a normal major papilla in       the descending duodenum without detailed examination of the pharynx,       larynx and associated structures, and upper GI tract. The upper GI tract       was grossly normal. The bile duct was deeply cannulated with the       short-nosed traction sphincterotome. Contrast was injected. I personally       interpreted the bile duct images. There was brisk flow of contrast       through the ducts. Image quality was excellent. Contrast extended to the       entire biliary tree. The main bile duct contained four stones, the       largest of which was 15 mm in diameter. A wire was passed into the       biliary tree. A 10 mm biliary sphincterotomy was made with a traction       (standard) sphincterotome using ERBE electrocautery. There was no       post-sphincterotomy bleeding. Dilation of the common bile duct with a       12-13.5-15 mm balloon (to a maximum balloon size of 15 mm) dilator was       successful. The biliary tree was swept with a 15 mm balloon starting at       the bifurcation. All stones were removed. Nothing was found. Impression:            - Choledocholithiasis was found. Complete removal was                         accomplished by biliary sphincterotomy and balloon                         extraction.                        - A biliary sphincterotomy was performed.                        - Common bile duct was successfully dilated.                        - The biliary tree was swept and nothing was found.  Recommendation:        - Return patient to hospital ward for ongoing care.                         - Clear liquid diet.                        - Watch for pancreatitis, bleeding, perforation, and                         cholangitis. Procedure Code(s):     --- Professional ---                        [phone], 59, Endoscopic retrograde                         cholangiopancreatography (ERCP); with trans-endoscopic                         balloon dilation of biliary/pancreatic duct(s) or of                         ampulla (sphincteroplasty), including sphincterotomy,                         when performed, each duct                        43264, Endoscopic retrograde cholangiopancreatography                         (ERCP); with removal of calculi/debris from                         biliary/pancreatic duct(s)                        25956, Endoscopic catheterization of the biliary                         ductal system, radiological supervision and                         interpretation Diagnosis Code(s):     --- Professional ---                        K80.50, Calculus of bile duct without cholangitis or                         cholecystitis without obstruction                        R74.8, Abnormal levels of other serum enzymes CPT copyright 2022 American [employer] Association. All rights reserved. The codes documented in this report are preliminary and upon coder review may  be revised to meet current compliance requirements. Midge Minium MD, MD 12/18/2022 3:06:52 PM This report has been signed electronically. Number of Addenda: 0 Note Initiated On: 12/18/2022 2:00 PM Estimated Blood Loss:  Estimated blood loss: none.      [facility]

## 2022-12-18 NOTE — Transfer of Care (Signed)
Immediate Anesthesia Transfer of Care Note  Patient: Kathryn Torres  Procedure(s) Performed: ENDOSCOPIC RETROGRADE CHOLANGIOPANCREATOGRAPHY (ERCP) WITH PROPOFOL  Patient Location: PACU and Endoscopy Unit  Anesthesia Type:General  Level of Consciousness: awake and drowsy  Airway & Oxygen Therapy: Patient Spontanous Breathing and Patient connected to nasal cannula oxygen  Post-op Assessment: Report given to RN and Post -op Vital signs reviewed and stable  Post vital signs: Reviewed and stable  Last Vitals:  Vitals Value Taken Time  BP 122/85 12/18/22 1508  Temp    Pulse 85 12/18/22 1510  Resp 26 12/18/22 1510  SpO2 93 % 12/18/22 1510  Vitals shown include unvalidated device data.  Last Pain:  Vitals:   12/18/22 1322  TempSrc: Temporal  PainSc: 0-No pain         Complications: No notable events documented.

## 2022-12-18 NOTE — Assessment & Plan Note (Addendum)
Elevated LFT's GI and Gen Surgery following ERCP done 6/26 Robotic-assisted lap chole 6/27 Pt doing well POD -1 today. --Stop IV fluids --Diet advanced and pt is tolerating well so far --Discharge on oral Omnicef & Flagyl to complete 7 day course, per surgery given degree of inflammation seen in surgery --Repeat CMP at follow up --Follow up in surgery clinic --Drain remains in place until follow up, monitor output at home.  RN did drain teaching/educations.

## 2022-12-18 NOTE — Progress Notes (Signed)
[facility] SURGICAL PROGRESS NOTE (cpt [id number])  Hospital Day(s): 1.   Interval History: Patient seen and examined, no acute events or new complaints overnight. Patient reports she is dong better this morning. Abdominal pain resolved, nausea also resolved. No fever, chills, cough, CP, SOB. She remains without leukocytosis; 5.5K. Hgb to 12.3. Renal function improving; sCr - 1.07. AST/ALT with mild improvements. Alkaline phosphatase improved to 229 (from 349). Hyperbilirubinemia improving as well; now 2.2. Plan for ERCP today. Cholecystectomy tentatively planned for tomorrow (06/27).   Review of Systems:  Constitutional: denies fever, chills  HEENT: denies cough or congestion  Respiratory: denies any shortness of breath  Cardiovascular: denies chest pain or palpitations  Gastrointestinal: denies abdominal pain, N/V Genitourinary: denies burning with urination or urinary frequency Musculoskeletal: denies pain, decreased motor or sensation  Vital signs in last 24 hours: [min-max] current  Temp:  [98 F (36.7 C)-98.3 F (36.8 C)] 98 F (36.7 C) (06/26 0742) Pulse Rate:  [80-111] 87 (06/26 0742) Resp:  [16-18] 17 (06/26 0742) BP: (112-134)/(51-82) 112/54 (06/26 0742) SpO2:  [97 %-100 %] 100 % (06/26 0742) Weight:  [89.8 kg] 89.8 kg (06/25 1404)     Height: 5\' 3"  (160 cm) Weight: 89.8 kg BMI (Calculated): 35.08   Intake/Output last 2 shifts:  06/25 0701 - 06/26 0700 In: 1050 [IV Piggyback:1050] Out: -    Physical Exam:  Constitutional: alert, cooperative and no distress  HENT: normocephalic without obvious abnormality  Eyes: PERRL, EOM's grossly intact and symmetric  Respiratory: breathing non-labored at rest  Cardiovascular: regular rate and sinus rhythm  Gastrointestinal: soft, non-tender, and non-distended. No rebound/guarding  Musculoskeletal: no edema or wounds, motor and sensation grossly intact, NT    Labs:     Latest Ref Rng & Units 12/18/2022    6:33 AM  12/17/2022    2:06 PM 01/14/2022    3:37 PM  CBC  WBC 4.0 - 10.5 K/uL 5.5  10.1    9.9  9.1   Hemoglobin 12.0 - 15.0 g/dL 19.1  47.8    29.5  62.1   Hematocrit 36.0 - 46.0 % 36.9  46.6    46.9  41.3   Platelets 150 - 400 K/uL 220  365    371  325.0       Latest Ref Rng & Units 12/18/2022    6:33 AM 12/17/2022    2:06 PM 12/17/2022   11:02 AM  CMP  Glucose 70 - 99 mg/dL 308  657  93   BUN 6 - 20 mg/dL 24  26  25    Creatinine 0.44 - 1.00 mg/dL 8.46  9.62  9.52   Sodium 135 - 145 mmol/L 138  134  133   Potassium 3.5 - 5.1 mmol/L 3.8  3.8  4.3   Chloride 98 - 111 mmol/L 106  99  99   CO2 22 - 32 mmol/L 25  20  25    Calcium 8.9 - 10.3 mg/dL 9.1  84.1  9.6   Total Protein 6.5 - 8.1 g/dL 6.4  8.6  8.1   Total Bilirubin 0.3 - 1.2 mg/dL 2.2  3.2  2.9   Alkaline Phos 38 - 126 U/L 229  349  322   AST 15 - 41 U/L 151  183  172   ALT 0 - 44 U/L 272  350  335      Imaging studies: No new pertinent imaging studies   Assessment/Plan: (ICD-10's: K80.50) 62 y.o. female with choledocholithiasis .   -  NPO for planned GI procedure; Okay for CLD following completion, will need to be NPO at midnight 06/27 - Appreciate GI assistance; ERCP today  - Tentatively plan for robotic assisted laparoscopic cholecystectomy tomorrow (06/27) with Dr Everlene Farrier  - Monitor abdominal examination; on-going bowel function  - Pain control prn; antiemetics prn  - Mobilize as tolerated   - Further management per primary service; we will follow    All of the above findings and recommendations were discussed with the patient, and the medical team, and all of patient's questions were answered to her expressed satisfaction.  -- Lynden Oxford, PA-C [facility] 12/18/2022, 8:00 AM M-F: 7am - 4pm

## 2022-12-18 NOTE — ED Notes (Signed)
Lab called to draw pt's labs

## 2022-12-18 NOTE — Progress Notes (Signed)
  Progress Note   Patient: Kathryn Torres ZOX:[id number] DOB: 05/09/1961 DOA: 12/17/2022     1 DOS: the patient was seen and examined on 12/18/2022   Brief hospital course: HPI on admission 12/17/22 by Dr. Ashok Pall: "Kathryn Torres is a 62 y.o. female with medical history significant for htn, obesity, and gallstones, presents with the above.   Hx biliary colic, gets the occasional attack but it is mild and has avoided surgery thus far. Several days ago developed severe epigastric abdominal pain that has come and gone, currently just a nagging pain. Also with nausea and nbnb emesis. Is passing stool and flatus, no diarrhea. No fever or chills. Went to an urgent care today where labs were abnormal and u/s consistent w/ choledocholithiasis, referred to our ER. "  Consults: General surgery, GI   Assessment and Plan: * Choledocholithiasis Elevated LFT's GI and Gen Surgery following ERCP planned today Possible cholecystectomy tomorrow Continue IV fluids Holding antibiotics for now, defer to GI and surgery but pt afebrile, no leukocytosis. NPO for now, diet per GI, surgery post ERCP NPO after midnight Follow CMP / LFT's  AKI (acute kidney injury) (HCC) Mild, Cr 1.29 on admission likely due to dehydration and poor PO Intake. On IV fluids Monitor BMP  Obesity (BMI 30-39.9) Body mass index is 35.07 kg/m. Complicates overall care and prognosis.  Recommend lifestyle modifications including physical activity and diet for weight loss and overall long-term health.  Hypertension Continue home metoprolol Hold other meds & resume when BP requires them.  BP's are controlled.        Subjective: Pt seen with husband at bedside this AM, before ERCP.  She reports intermittent nausea, phenergan helps.  Currently not having pain.  Denies any fever/chills.  Physical Exam: Vitals:   12/18/22 0430 12/18/22 0742 12/18/22 0846 12/18/22 1322  BP: (!) 119/51 (!) 112/54 127/65 (!) 146/79  Pulse: 80 87 76  78  Resp: 18 17  15   Temp:  98 F (36.7 C) 98.3 F (36.8 C) (!) 96.3 F (35.7 C)  TempSrc:   Oral Temporal  SpO2: 98% 100% 100% 100%  Weight:    89.8 kg  Height:    5\' 3"  (1.6 m)   General exam: awake, alert, no acute distress, obese HEENT: moist mucus membranes, hearing grossly normal  Respiratory system: CTAB, no wheezes, rales or rhonchi, normal respiratory effort. Cardiovascular system: normal S1/S2, RRR, no pedal edema.   Gastrointestinal system: soft, NT, ND, no HSM felt, +bowel sounds. Central nervous system: A&O x4. no gross focal neurologic deficits, normal speech Extremities: moves all, no edema, normal tone Skin: dry, intact, normal temperature, normal color, No rashes, lesions or ulcers Psychiatry: normal mood, congruent affect, judgement and insight appear normal   Data Reviewed:  Notable labs --- Cr improved 1.07, glucose 117, alk phos 229, albumin 3.2, AST improved 151, ALT improved 272, T bili 2.2 improved.  Normal CBC     Family Communication: husband at bedside  Disposition: Status is: Inpatient Remains inpatient appropriate because: ongoing evaluation / procedures   Planned Discharge Destination: Home    Time spent: 42 minutes  Author: Pennie Banter, DO 12/18/2022 1:28 PM  For on call review [email or web address].

## 2022-12-18 NOTE — ED Notes (Signed)
Pt ambulatory to bathroom without difficulty.  

## 2022-12-19 ENCOUNTER — Inpatient Hospital Stay: Payer: BC Managed Care – PPO | Admitting: Certified Registered"

## 2022-12-19 ENCOUNTER — Encounter
Admission: EM | Disposition: A | Discharge status: Home / Self Care | Payer: Self-pay | Source: Home / Self Care | Attending: Internal Medicine

## 2022-12-19 ENCOUNTER — Encounter: Payer: Self-pay | Admitting: Obstetrics and Gynecology

## 2022-12-19 DIAGNOSIS — K8046 Calculus of bile duct with acute and chronic cholecystitis without obstruction: Secondary | ICD-10-CM | POA: Diagnosis not present

## 2022-12-19 DIAGNOSIS — K8050 Calculus of bile duct without cholangitis or cholecystitis without obstruction: Secondary | ICD-10-CM | POA: Diagnosis not present

## 2022-12-19 DIAGNOSIS — N390 Urinary tract infection, site not specified: Secondary | ICD-10-CM | POA: Clinically undetermined

## 2022-12-19 LAB — CBC
HCT: 35.7 % — ABNORMAL LOW (ref 36.0–46.0)
Hemoglobin: 11.8 g/dL — ABNORMAL LOW (ref 12.0–15.0)
MCH: 28.1 pg (ref 26.0–34.0)
MCHC: 33.1 g/dL (ref 30.0–36.0)
MCV: 85 fL (ref 80.0–100.0)
Platelets: 234 10*3/uL (ref 150–400)
RBC: 4.2 MIL/uL (ref 3.87–5.11)
RDW: 12.9 % (ref 11.5–15.5)
WBC: 6.7 10*3/uL (ref 4.0–10.5)
nRBC: 0 % (ref 0.0–0.2)

## 2022-12-19 LAB — COMPREHENSIVE METABOLIC PANEL
ALT: 224 U/L — ABNORMAL HIGH (ref 0–44)
AST: 100 U/L — ABNORMAL HIGH (ref 15–41)
Albumin: 3.1 g/dL — ABNORMAL LOW (ref 3.5–5.0)
Alkaline Phosphatase: 206 U/L — ABNORMAL HIGH (ref 38–126)
Anion gap: 8 (ref 5–15)
BUN: 16 mg/dL (ref 6–20)
CO2: 23 mmol/L (ref 22–32)
Calcium: 8.6 mg/dL — ABNORMAL LOW (ref 8.9–10.3)
Chloride: 105 mmol/L (ref 98–111)
Creatinine, Ser: 0.9 mg/dL (ref 0.44–1.00)
GFR, Estimated: >60 mL/min (ref >60)
Glucose, Bld: 109 mg/dL — ABNORMAL HIGH (ref 70–99)
Potassium: 3.6 mmol/L (ref 3.5–5.1)
Sodium: 136 mmol/L (ref 135–145)
Total Bilirubin: 1.8 mg/dL — ABNORMAL HIGH (ref 0.3–1.2)
Total Protein: 6.2 g/dL — ABNORMAL LOW (ref 6.5–8.1)

## 2022-12-19 LAB — URINALYSIS, ROUTINE W REFLEX MICROSCOPIC
Bilirubin Urine: NEGATIVE
Glucose, UA: NEGATIVE mg/dL
Hgb urine dipstick: NEGATIVE
Ketones, ur: NEGATIVE mg/dL
Nitrite: POSITIVE — AB
Protein, ur: NEGATIVE mg/dL
Specific Gravity, Urine: 1.009 (ref 1.005–1.030)
WBC, UA: >50 WBC/hpf (ref 0–5)
pH: 5 (ref 5.0–8.0)

## 2022-12-19 LAB — LIPASE, BLOOD: Lipase: 69 U/L — ABNORMAL HIGH (ref 11–51)

## 2022-12-19 SURGERY — CHOLECYSTECTOMY, ROBOT-ASSISTED, LAPAROSCOPIC
Anesthesia: General

## 2022-12-19 MED ORDER — CEFAZOLIN SODIUM-DEXTROSE 2-3 GM-%(50ML) IV SOLR
INTRAVENOUS | Status: DC | PRN
  Administered 2022-12-19: 2 g via INTRAVENOUS

## 2022-12-19 MED ORDER — BUPIVACAINE LIPOSOME 1.3 % IJ SUSP
INTRAMUSCULAR | Status: AC
  Filled 2022-12-19: qty 20

## 2022-12-19 MED ORDER — MIDAZOLAM HCL 2 MG/2ML IJ SOLN
INTRAMUSCULAR | Status: AC
  Filled 2022-12-19: qty 2

## 2022-12-19 MED ORDER — CHLORHEXIDINE GLUCONATE CLOTH 2 % EX PADS: 6.0000 | MEDICATED_PAD | Freq: Every day | CUTANEOUS | Status: DC

## 2022-12-19 MED ORDER — OXYCODONE HCL 5 MG PO TABS: 5.0000 mg | ORAL_TABLET | Freq: Once | ORAL | Status: DC | PRN

## 2022-12-19 MED ORDER — PROPOFOL 10 MG/ML IV BOLUS
INTRAVENOUS | Status: DC | PRN
  Administered 2022-12-19: 150 mg via INTRAVENOUS
  Administered 2022-12-19: 10 mg via INTRAVENOUS

## 2022-12-19 MED ORDER — FENTANYL CITRATE (PF) 100 MCG/2ML IJ SOLN: 25.0000 ug | INTRAMUSCULAR | Status: DC | PRN

## 2022-12-19 MED ORDER — HYDROMORPHONE HCL 1 MG/ML IJ SOLN
INTRAMUSCULAR | Status: AC
  Filled 2022-12-19: qty 1

## 2022-12-19 MED ORDER — KETOROLAC TROMETHAMINE 30 MG/ML IJ SOLN
30.0000 mg | Freq: Four times a day (QID) | INTRAMUSCULAR | Status: DC
  Filled 2022-12-19: qty 1

## 2022-12-19 MED ORDER — ROCURONIUM BROMIDE 100 MG/10ML IV SOLN
INTRAVENOUS | Status: DC | PRN
  Administered 2022-12-19: 50 mg via INTRAVENOUS
  Administered 2022-12-19: 5 mg via INTRAVENOUS

## 2022-12-19 MED ORDER — LIDOCAINE HCL (CARDIAC) PF 100 MG/5ML IV SOSY
PREFILLED_SYRINGE | INTRAVENOUS | Status: DC | PRN
  Administered 2022-12-19: 80 mg via INTRAVENOUS

## 2022-12-19 MED ORDER — BUPIVACAINE LIPOSOME 1.3 % IJ SUSP
INTRAMUSCULAR | Status: DC | PRN
  Administered 2022-12-19: 20 mL

## 2022-12-19 MED ORDER — ACETAMINOPHEN 10 MG/ML IV SOLN
INTRAVENOUS | Status: AC
  Filled 2022-12-19: qty 100

## 2022-12-19 MED ORDER — FENTANYL CITRATE (PF) 100 MCG/2ML IJ SOLN
INTRAMUSCULAR | Status: AC
  Filled 2022-12-19: qty 2

## 2022-12-19 MED ORDER — EPINEPHRINE PF 1 MG/ML IJ SOLN
INTRAMUSCULAR | Status: AC
  Filled 2022-12-19: qty 1

## 2022-12-19 MED ORDER — METRONIDAZOLE 500 MG/100ML IV SOLN
500.0000 mg | Freq: Two times a day (BID) | INTRAVENOUS | Status: DC
  Administered 2022-12-19 – 2022-12-21 (×4): 500 mg via INTRAVENOUS
  Filled 2022-12-19 (×5): qty 100

## 2022-12-19 MED ORDER — PHENYLEPHRINE HCL (PRESSORS) 10 MG/ML IV SOLN
INTRAVENOUS | Status: DC | PRN
  Administered 2022-12-19 (×2): 160 ug via INTRAVENOUS

## 2022-12-19 MED ORDER — OXYCODONE HCL 5 MG/5ML PO SOLN: 5.0000 mg | Freq: Once | ORAL | Status: DC | PRN

## 2022-12-19 MED ORDER — ONDANSETRON HCL 4 MG/2ML IJ SOLN
INTRAMUSCULAR | Status: DC | PRN
  Administered 2022-12-19: 4 mg via INTRAVENOUS

## 2022-12-19 MED ORDER — SUGAMMADEX SODIUM 200 MG/2ML IV SOLN
INTRAVENOUS | Status: DC | PRN
  Administered 2022-12-19: 200 mg via INTRAVENOUS

## 2022-12-19 MED ORDER — BUPIVACAINE HCL (PF) 0.25 % IJ SOLN
INTRAMUSCULAR | Status: AC
  Filled 2022-12-19: qty 30

## 2022-12-19 MED ORDER — FENTANYL CITRATE (PF) 100 MCG/2ML IJ SOLN
INTRAMUSCULAR | Status: DC | PRN
  Administered 2022-12-19: 100 ug via INTRAVENOUS

## 2022-12-19 MED ORDER — ACETAMINOPHEN 10 MG/ML IV SOLN: 1000.0000 mg | Freq: Once | INTRAVENOUS | Status: DC | PRN

## 2022-12-19 MED ORDER — HYDROMORPHONE HCL 1 MG/ML IJ SOLN
INTRAMUSCULAR | Status: DC | PRN
  Administered 2022-12-19 (×2): .5 mg via INTRAVENOUS

## 2022-12-19 MED ORDER — ACETAMINOPHEN 500 MG PO TABS
1000.0000 mg | ORAL_TABLET | Freq: Four times a day (QID) | ORAL | Status: DC
  Administered 2022-12-19 – 2022-12-21 (×5): 1000 mg via ORAL
  Filled 2022-12-19 (×6): qty 2

## 2022-12-19 MED ORDER — ACETAMINOPHEN 10 MG/ML IV SOLN
INTRAVENOUS | Status: DC | PRN
  Administered 2022-12-19: 1000 mg via INTRAVENOUS

## 2022-12-19 MED ORDER — DEXAMETHASONE SODIUM PHOSPHATE 10 MG/ML IJ SOLN
INTRAMUSCULAR | Status: DC | PRN
  Administered 2022-12-19: 5 mg via INTRAVENOUS

## 2022-12-19 MED ORDER — PROMETHAZINE HCL 25 MG/ML IJ SOLN
INTRAMUSCULAR | Status: AC
  Filled 2022-12-19: qty 1

## 2022-12-19 MED ORDER — SODIUM CHLORIDE 0.9 % IV SOLN
1.0000 g | INTRAVENOUS | Status: DC
  Administered 2022-12-19 – 2022-12-20 (×2): 1 g via INTRAVENOUS
  Filled 2022-12-19 (×3): qty 10

## 2022-12-19 MED ORDER — OXYCODONE HCL 5 MG PO TABS: 5.0000 mg | ORAL_TABLET | ORAL | Status: DC | PRN

## 2022-12-19 MED ORDER — INDOCYANINE GREEN 25 MG IV SOLR
INTRAVENOUS | Status: AC
  Filled 2022-12-19: qty 10

## 2022-12-19 MED ORDER — BUPIVACAINE-EPINEPHRINE (PF) 0.25% -1:200000 IJ SOLN
INTRAMUSCULAR | Status: DC | PRN
  Administered 2022-12-19: 30 mL

## 2022-12-19 MED ORDER — DEXMEDETOMIDINE HCL IN NACL 200 MCG/50ML IV SOLN
INTRAVENOUS | Status: DC | PRN
  Administered 2022-12-19: 8 ug via INTRAVENOUS
  Administered 2022-12-19: 4 ug via INTRAVENOUS

## 2022-12-19 MED ORDER — INDOCYANINE GREEN 25 MG IV SOLR
2.5000 mg | Freq: Once | INTRAVENOUS | Status: AC
  Administered 2022-12-19: 2.5 mg via INTRAVENOUS

## 2022-12-19 MED ORDER — LACTATED RINGERS IV SOLN: INTRAVENOUS | Status: DC | PRN

## 2022-12-19 MED ORDER — KETOROLAC TROMETHAMINE 15 MG/ML IJ SOLN
INTRAMUSCULAR | Status: DC | PRN
  Administered 2022-12-19: 15 mg via INTRAVENOUS

## 2022-12-19 MED ORDER — ONDANSETRON HCL 4 MG/2ML IJ SOLN: 4.0000 mg | Freq: Once | INTRAMUSCULAR | Status: DC | PRN

## 2022-12-19 MED ORDER — MIDAZOLAM HCL 2 MG/2ML IJ SOLN
INTRAMUSCULAR | Status: DC | PRN
  Administered 2022-12-19: 2 mg via INTRAVENOUS

## 2022-12-19 MED ORDER — PROMETHAZINE HCL 25 MG/ML IJ SOLN
6.2500 mg | Freq: Once | INTRAMUSCULAR | Status: AC
  Administered 2022-12-19: 6.25 mg via INTRAVENOUS

## 2022-12-19 SURGICAL SUPPLY — 52 items
ADH SKN CLS APL DERMABOND .7 (GAUZE/BANDAGES/DRESSINGS) ×1
BULB RESERV EVAC DRAIN JP 100C (MISCELLANEOUS) IMPLANT
CANNULA REDUCER 12-8 DVNC XI (CANNULA) ×1 IMPLANT
CATH REDDICK CHOLANGI 4FR 50CM (CATHETERS) IMPLANT
CAUTERY HOOK MNPLR 1.6 DVNC XI (INSTRUMENTS) ×1 IMPLANT
CLIP LIGATING HEM O LOK PURPLE (MISCELLANEOUS) IMPLANT
CLIP LIGATING HEMO O LOK GREEN (MISCELLANEOUS) ×1 IMPLANT
DERMABOND ADVANCED .7 DNX12 (GAUZE/BANDAGES/DRESSINGS) ×1 IMPLANT
DRAIN CHANNEL JP 19F (MISCELLANEOUS) IMPLANT
DRAPE ARM DVNC X/XI (DISPOSABLE) ×4 IMPLANT
DRAPE COLUMN DVNC XI (DISPOSABLE) ×1 IMPLANT
ELECT CAUTERY BLADE 6.4 (BLADE) ×1 IMPLANT
ELECT REM PT RETURN 9FT ADLT (ELECTROSURGICAL) ×1
ELECTRODE REM PT RTRN 9FT ADLT (ELECTROSURGICAL) ×1 IMPLANT
FORCEPS BPLR R/ABLATION 8 DVNC (INSTRUMENTS) ×1 IMPLANT
FORCEPS PROGRASP DVNC XI (FORCEP) ×1 IMPLANT
GLOVE BIO SURGEON STRL SZ7 (GLOVE) ×2 IMPLANT
GOWN STRL REUS W/ TWL LRG LVL3 (GOWN DISPOSABLE) ×4 IMPLANT
GOWN STRL REUS W/TWL LRG LVL3 (GOWN DISPOSABLE) ×4
IRRIGATION STRYKERFLOW (MISCELLANEOUS) IMPLANT
IRRIGATOR STRYKERFLOW (MISCELLANEOUS) ×1
IV CATH ANGIO 12GX3 LT BLUE (NEEDLE) IMPLANT
KIT PINK PAD W/HEAD ARE REST (MISCELLANEOUS) ×1
KIT PINK PAD W/HEAD ARM REST (MISCELLANEOUS) ×1 IMPLANT
LABEL OR SOLS (LABEL) ×1 IMPLANT
MANIFOLD NEPTUNE II (INSTRUMENTS) ×1 IMPLANT
NDL HYPO 22X1.5 SAFETY MO (MISCELLANEOUS) ×1 IMPLANT
NEEDLE HYPO 22X1.5 SAFETY MO (MISCELLANEOUS) ×1 IMPLANT
NS IRRIG 500ML POUR BTL (IV SOLUTION) ×1 IMPLANT
OBTURATOR OPTICAL STND 8 DVNC (TROCAR) ×1
OBTURATOR OPTICALSTD 8 DVNC (TROCAR) ×1 IMPLANT
PACK LAP CHOLECYSTECTOMY (MISCELLANEOUS) ×1 IMPLANT
PENCIL SMOKE EVACUATOR (MISCELLANEOUS) ×1 IMPLANT
SEAL UNIV 5-12 XI (MISCELLANEOUS) ×4 IMPLANT
SET TUBE SMOKE EVAC HIGH FLOW (TUBING) ×1 IMPLANT
SOL ELECTROSURG ANTI STICK (MISCELLANEOUS) ×1
SOLUTION ELECTROSURG ANTI STCK (MISCELLANEOUS) ×1 IMPLANT
SPIKE FLUID TRANSFER (MISCELLANEOUS) ×1 IMPLANT
SPONGE DRAIN TRACH 4X4 STRL 2S (GAUZE/BANDAGES/DRESSINGS) IMPLANT
SPONGE T-LAP 18X18 ~~LOC~~+RFID (SPONGE) ×1 IMPLANT
SPONGE T-LAP 4X18 ~~LOC~~+RFID (SPONGE) IMPLANT
STOPCOCK 3WAY MALE LL (IV SETS) IMPLANT
SUT ETHILON 3-0 FS-10 30 BLK (SUTURE) ×1
SUT MNCRL AB 4-0 PS2 18 (SUTURE) ×1 IMPLANT
SUT VICRYL 0 UR6 27IN ABS (SUTURE) ×2 IMPLANT
SUTURE EHLN 3-0 FS-10 30 BLK (SUTURE) IMPLANT
SYR 20ML LL LF (SYRINGE) IMPLANT
SYS BAG RETRIEVAL 10MM (BASKET) ×1
SYSTEM BAG RETRIEVAL 10MM (BASKET) ×1 IMPLANT
TRAP FLUID SMOKE EVACUATOR (MISCELLANEOUS) ×1 IMPLANT
WATER STERILE IRR 3000ML UROMA (IV SOLUTION) IMPLANT
WATER STERILE IRR 500ML POUR (IV SOLUTION) ×1 IMPLANT

## 2022-12-19 NOTE — Transfer of Care (Signed)
Immediate Anesthesia Transfer of Care Note  Patient: Kathryn Torres  Procedure(s) Performed: XI ROBOTIC ASSISTED LAPAROSCOPIC CHOLECYSTECTOMY INDOCYANINE GREEN FLUORESCENCE IMAGING (ICG)  Patient Location: PACU  Anesthesia Type:General  Level of Consciousness: drowsy  Airway & Oxygen Therapy: Patient Spontanous Breathing and Patient connected to face mask oxygen  Post-op Assessment: Report given to RN  Post vital signs: Reviewed  Last Vitals:  Vitals Value Taken Time  BP 109/54 12/19/22 1250  Temp 35.9 C 12/19/22 1250  Pulse 72 12/19/22 1254  Resp 9 12/19/22 1254  SpO2 100 % 12/19/22 1254  Vitals shown include unvalidated device data.  Last Pain:  Vitals:   12/19/22 1250  TempSrc:   PainSc: Asleep         Complications: No notable events documented.

## 2022-12-19 NOTE — Progress Notes (Addendum)
Progress Note   Patient: Kathryn Torres AVW:[id number] DOB: 03/05/1961 DOA: 12/17/2022     2 DOS: the patient was seen and examined on 12/19/2022   Brief hospital course: HPI on admission 12/17/22 by Dr. Ashok Pall: "Kathryn Torres is a 62 y.o. female with medical history significant for htn, obesity, and gallstones, presents with the above.   Hx biliary colic, gets the occasional attack but it is mild and has avoided surgery thus far. Several days ago developed severe epigastric abdominal pain that has come and gone, currently just a nagging pain. Also with nausea and nbnb emesis. Is passing stool and flatus, no diarrhea. No fever or chills. Went to an urgent care today where labs were abnormal and u/s consistent w/ choledocholithiasis, referred to our ER. "  Consults: General surgery, GI   Assessment and Plan: * Choledocholithiasis Elevated LFT's GI and Gen Surgery following ERCP done 6/26 Robotic-assisted lap chole this AM Continue IV fluids Resume clear liquid diet post op Have been holding antibiotics for now, defer to GI and surgery but pt afebrile, no leukocytosis.   Started on Rocephin today for UTI Will add Flagyl for intra-abdominal coverage, given degree of inflammation seen during surgery. Follow CMP's / LFT's  UTI (urinary tract infection) Pre-op UA 6/27 concerning for infection. Pt notes dysuria. --Start Rocephin --Follow urine culture results  AKI (acute kidney injury) (HCC) Mild, Cr 1.29 on admission likely due to dehydration and poor PO Intake. On IV fluids Monitor BMP  Obesity (BMI 30-39.9) Body mass index is 35.07 kg/m. Complicates overall care and prognosis.  Recommend lifestyle modifications including physical activity and diet for weight loss and overall long-term health.  Hypertension Continue home metoprolol Hold other meds & resume when BP requires them.  BP's are controlled.        Subjective: Pt seen after surgery today with husband at bedside.   Pt reports nausea, given medication recently.  Otherwise feeling relatively okay, just returned to her room from PACU.    Physical Exam: Vitals:   12/19/22 1315 12/19/22 1330 12/19/22 1411 12/19/22 1441  BP: (!) 125/49 (!) 112/51 (!) 116/57 (!) 124/54  Pulse: 74 69 66 67  Resp: 18 (!) 25 15 16   Temp:  (!) 96.9 F (36.1 C) 97.8 F (36.6 C) 97.8 F (36.6 C)  TempSrc:   Oral   SpO2: 100% 98% 98% 98%  Weight:      Height:       General exam: awake, alert, no acute distress, obese HEENT: moist mucus membranes, hearing grossly normal  Respiratory system: CTAB, no wheezes, rales or rhonchi, normal respiratory effort. Cardiovascular system: normal S1/S2, RRR, no pedal edema.   Gastrointestinal system: soft, post-op tenderness, drain present Central nervous system: A&O x4. no gross focal neurologic deficits, normal speech Extremities: moves all, no edema, normal tone Skin: dry, intact, normal temperature, normal color, No rashes, lesions or ulcers Psychiatry: normal mood, congruent affect, judgement and insight appear normal   Data Reviewed:  Notable labs --- glucose 109, Ca 8.6, alk phos 206 improving, AST 100 improving, ALT 224 improving, Tbili 1.8 improving, Hbg 11.8 stable     UA - positive nitrite, large leukocytes, rare bacteria, > 50 WBC'x  Micro - urine culture pending   Family Communication: husband at bedside  Disposition: Status is: Inpatient Remains inpatient appropriate because: ongoing evaluation / procedures. On IV antibiotics pending urine culture results.   Planned Discharge Destination: Home    Time spent: 38 minutes  Author: Alphonsus Sias  Denton Lank, DO 12/19/2022 2:57 PM  For on call review [email or web address].

## 2022-12-19 NOTE — Anesthesia Preprocedure Evaluation (Signed)
Anesthesia Evaluation  Patient identified by MRN, date of birth, ID band Patient awake    Reviewed: Allergy & Precautions, NPO status , Patient's Chart, lab work & pertinent test results  History of Anesthesia Complications Negative for: history of anesthetic complications  Airway Mallampati: II  TM Distance: >3 FB Neck ROM: Full    Dental  (+) Teeth Intact   Pulmonary neg pulmonary ROS, neg sleep apnea, neg COPD, Patient abstained from smoking.Not current smoker   Pulmonary exam normal breath sounds clear to auscultation       Cardiovascular Exercise Tolerance: Good METShypertension, Pt. on medications (-) CAD and (-) Past MI negative cardio ROS (-) dysrhythmias  Rhythm:Regular Rate:Normal - Systolic murmurs    Neuro/Psych negative neurological ROS  negative psych ROS   GI/Hepatic ,neg GERD  ,,(+)     (-) substance abuse    Endo/Other  neg diabetes    Renal/GU negative Renal ROS     Musculoskeletal   Abdominal  (+) + obese  Peds  Hematology   Anesthesia Other Findings Past Medical History: No date: Hypertension 03/05/2019: Lower respiratory tract infection due to COVID-19 virus 2006: melanoma     Comment:  lower back 2002: Previous pregnancy with hemolysis, elevated liver enzymes, and  low platelet (HELLP) syndrome, antepartum     Comment:  s/p c section   Reproductive/Obstetrics                             Anesthesia Physical Anesthesia Plan  ASA: 2  Anesthesia Plan: General   Post-op Pain Management: Ofirmev IV (intra-op)* and Toradol IV (intra-op)*   Induction: Intravenous  PONV Risk Score and Plan: 4 or greater and Ondansetron, Dexamethasone and Midazolam  Airway Management Planned: Oral ETT and Video Laryngoscope Planned  Additional Equipment: None  Intra-op Plan:   Post-operative Plan: Extubation in OR  Informed Consent: I have reviewed the patients History  and Physical, chart, labs and discussed the procedure including the risks, benefits and alternatives for the proposed anesthesia with the patient or authorized representative who has indicated his/her understanding and acceptance.     Dental advisory given  Plan Discussed with: CRNA and Surgeon  Anesthesia Plan Comments: (Discussed risks of anesthesia with patient, including PONV, sore throat, lip/dental/eye damage. Rare risks discussed as well, such as cardiorespiratory and neurological sequelae, and allergic reactions. Discussed the role of CRNA in patient's perioperative care. Patient understands.)       Anesthesia Quick Evaluation

## 2022-12-19 NOTE — Anesthesia Postprocedure Evaluation (Signed)
Anesthesia Post Note  Patient: Kathryn Torres  Procedure(s) Performed: ENDOSCOPIC RETROGRADE CHOLANGIOPANCREATOGRAPHY (ERCP) WITH PROPOFOL BALLOON DILATION SPHINCTEROTOMY REMOVAL OF STONES  Patient location during evaluation: Endoscopy Anesthesia Type: General Level of consciousness: awake and alert Pain management: pain level controlled Vital Signs Assessment: post-procedure vital signs reviewed and stable Respiratory status: spontaneous breathing, nonlabored ventilation and respiratory function stable Cardiovascular status: blood pressure returned to baseline and stable Postop Assessment: no apparent nausea or vomiting Anesthetic complications: no   No notable events documented.   Last Vitals:  Vitals:   12/19/22 0730 12/19/22 0838  BP: (!) 150/73 (!) 143/65  Pulse: 85 63  Resp: 17 16  Temp: 36.9 C   SpO2: 100% 98%    Last Pain:  Vitals:   12/19/22 0838  TempSrc: Oral  PainSc: 0-No pain                 Foye Deer

## 2022-12-19 NOTE — Assessment & Plan Note (Addendum)
Pre-op UA 6/27 concerning for infection. Pt notes dysuria. --Started Rocephin >> dicsharge on Omnicef for UTI and abdominal coverage per surgery --Follow urine culture results to final

## 2022-12-19 NOTE — Progress Notes (Signed)
[facility] SURGICAL PROGRESS NOTE  Hospital Day(s): 2.   Interval History: Patient seen and examined, no acute events or new complaints overnight. Patient reports she is feeling much better; had a good night. No abdominal pain. No fever, chills, cough, CP, SOB. She remains without leukocytosis; 6.7K. Hgb to 11.8. Renal function improving; sCr - 0.90. LFTs improving. Hyperbilirubinemia improving as well; now 1.8. ERCP done 06/26; notes reviewed. Plan for cholecystectomy this afternoon.   Vital signs in last 24 hours: [min-max] current  Temp:  [96.3 F (35.7 C)-98.5 F (36.9 C)] 97.8 F (36.6 C) (06/26 1854) Pulse Rate:  [63-87] 64 (06/26 1854) Resp:  [15-17] 16 (06/26 1854) BP: (112-146)/(54-79) 126/64 (06/26 1854) SpO2:  [99 %-100 %] 100 % (06/26 1854) Weight:  [89.8 kg] 89.8 kg (06/26 1322)     Height: 5\' 3"  (160 cm) Weight: 89.8 kg BMI (Calculated): 35.08   Intake/Output last 2 shifts:  06/26 0701 - 06/27 0700 In: 640 [P.O.:240; I.V.:400] Out: -    Physical Exam:  Constitutional: alert, cooperative and no distress  HENT: normocephalic without obvious abnormality  Eyes: PERRL, EOM's grossly intact and symmetric  Respiratory: breathing non-labored at rest  Cardiovascular: regular rate and sinus rhythm  Gastrointestinal: soft, non-tender, and non-distended. No rebound/guarding  Musculoskeletal: no edema or wounds, motor and sensation grossly intact, NT    Labs:     Latest Ref Rng & Units 12/19/2022    4:11 AM 12/18/2022    6:33 AM 12/17/2022    2:06 PM  CBC  WBC 4.0 - 10.5 K/uL 6.7  5.5  10.1    9.9   Hemoglobin 12.0 - 15.0 g/dL 51.8  84.1  66.0    63.0   Hematocrit 36.0 - 46.0 % 35.7  36.9  46.6    46.9   Platelets 150 - 400 K/uL 234  220  365    371       Latest Ref Rng & Units 12/19/2022    4:11 AM 12/18/2022    6:33 AM 12/17/2022    2:06 PM  CMP  Glucose 70 - 99 mg/dL 160  109  323   BUN 6 - 20 mg/dL 16  24  26    Creatinine 0.44 - 1.00 mg/dL  5.57  3.22  0.25   Sodium 135 - 145 mmol/L 136  138  134   Potassium 3.5 - 5.1 mmol/L 3.6  3.8  3.8   Chloride 98 - 111 mmol/L 105  106  99   CO2 22 - 32 mmol/L 23  25  20    Calcium 8.9 - 10.3 mg/dL 8.6  9.1  42.7   Total Protein 6.5 - 8.1 g/dL 6.2  6.4  8.6   Total Bilirubin 0.3 - 1.2 mg/dL 1.8  2.2  3.2   Alkaline Phos 38 - 126 U/L 206  229  349   AST 15 - 41 U/L 100  151  183   ALT 0 - 44 U/L 224  272  350      Imaging studies: No new pertinent imaging studies   Assessment/Plan: (ICD-10's: K80.50) 62 y.o. female with choledocholithiasis .   - NPO for surgery - Appreciate GI assistance; ERCP reviewed  - Plan for robotic assisted laparoscopic cholecystectomy this afternoon with Dr Everlene Farrier  - All risks, benefits, and alternatives to above procedure(s) were discussed with the patient, all of her questions were answered to her expressed satisfaction, patient expresses she wishes to proceed, and informed consent was obtained.   -  ICG on call to OR  - Monitor abdominal examination; on-going bowel function  - Pain control prn; antiemetics prn  - Mobilize as tolerated   - Further management per primary service; we will follow    All of the above findings and recommendations were discussed with the patient, and the medical team, and all of patient's questions were answered to her expressed satisfaction.  -- Lynden Oxford, PA-C [facility] 12/19/2022, 7:24 AM M-F: 7am - 4pm

## 2022-12-19 NOTE — Discharge Instructions (Addendum)
In addition to included general post-operative instructions,  Diet: Resume home diet. Recommend avoiding or limiting fatty/greasy foods over the next few days/week. If you do eat these, you may (or may not) notice diarrhea. This is expected while your body adjusts to not having a gallbladder, and it typically resolves with time.    Activity: No heavy lifting >20 pounds (children, pets, laundry, garbage) or strenuous activity for 4 weeks, but light activity and walking are encouraged. Do not drive or drink alcohol if taking narcotic pain medications or having pain that might distract from driving.  Drain: Monitor and record output from drain daily; Hand out given for this. Please bring this to your appointment.   Wound care: 2 days after surgery (06/29), you may shower/get incision wet with soapy water and pat dry (do not rub incisions), but no baths or submerging incision underwater until follow-up.   Medications: Resume all home medications. For mild to moderate pain: acetaminophen (Tylenol) or ibuprofen/naproxen (if no kidney disease). Combining Tylenol with alcohol can substantially increase your risk of causing liver disease. Narcotic pain medications, if prescribed, can be used for severe pain, though may cause nausea, constipation, and drowsiness. Do not combine Tylenol and Percocet (or similar) within a 6 hour period as Percocet (and similar) contain(s) Tylenol. If you do not need the narcotic pain medication, you do not need to fill the prescription.  Call office [phone] / [phone]) at any time if any questions, worsening pain, fevers/chills, bleeding, drainage from incision site, or other concerns.

## 2022-12-19 NOTE — Anesthesia Procedure Notes (Signed)
Procedure Name: Intubation Date/Time: 12/19/2022 11:13 AM  Performed by: Merlene Pulling, CRNAPre-anesthesia Checklist: Patient identified, Patient being monitored, Timeout performed, Emergency Drugs available and Suction available Patient Re-evaluated:Patient Re-evaluated prior to induction Oxygen Delivery Method: Circle system utilized Preoxygenation: Pre-oxygenation with 100% oxygen Induction Type: IV induction Ventilation: Mask ventilation without difficulty Laryngoscope Size: 3 and McGraph Grade View: Grade I Tube type: Oral Tube size: 6.5 mm Number of attempts: 1 Airway Equipment and Method: Stylet Placement Confirmation: ETT inserted through vocal cords under direct vision, positive ETCO2 and breath sounds checked- equal and bilateral Secured at: 21 cm Tube secured with: Tape Dental Injury: Teeth and Oropharynx as per pre-operative assessment

## 2022-12-19 NOTE — Anesthesia Postprocedure Evaluation (Signed)
Anesthesia Post Note  Patient: Kathryn Torres  Procedure(s) Performed: XI ROBOTIC ASSISTED LAPAROSCOPIC CHOLECYSTECTOMY INDOCYANINE GREEN FLUORESCENCE IMAGING (ICG)  Patient location during evaluation: PACU Anesthesia Type: General Level of consciousness: awake and alert Pain management: pain level controlled Vital Signs Assessment: post-procedure vital signs reviewed and stable Respiratory status: spontaneous breathing, nonlabored ventilation, respiratory function stable and patient connected to nasal cannula oxygen Cardiovascular status: blood pressure returned to baseline and stable Postop Assessment: no apparent nausea or vomiting Anesthetic complications: no   No notable events documented.   Last Vitals:  Vitals:   12/19/22 1315 12/19/22 1330  BP: (!) 125/49 (!) 112/51  Pulse: 74 69  Resp: 18 (!) 25  Temp:  (!) 36.1 C  SpO2: 100% 98%    Last Pain:  Vitals:   12/19/22 1315  TempSrc:   PainSc: Asleep                 Corinda Gubler

## 2022-12-19 NOTE — Op Note (Signed)
Robotic assisted laparoscopic Cholecystectomy  Pre-operative Diagnosis: choledocholithiasis, chronic cholecystitis  Post-operative Diagnosis: same  Procedure:  Robotic assisted laparoscopic Cholecystectomy  Surgeon: Sterling Big, MD FACS  Anesthesia: Gen. with endotracheal tube  Findings: Acute on chronic severe Cholecystitis Liver steatosis w very large liver Difficult case due to inflammation and body habitus   Estimated Blood Loss: 25cc       Specimens: Gallbladder           Complications: none  Procedure Details  The patient was seen again in the Holding Room. The benefits, complications, treatment options, and expected outcomes were discussed with the patient. The risks of bleeding, infection, recurrence of symptoms, failure to resolve symptoms, bile duct damage, bile duct leak, retained common bile duct stone, bowel injury, any of which could require further surgery and/or ERCP, stent, or papillotomy were reviewed with the patient. The likelihood of improving the patient's symptoms with return to their baseline status is good.  The patient and/or family concurred with the proposed plan, giving informed consent.  The patient was taken to Operating Room, identified  and the procedure verified as Laparoscopic Cholecystectomy.  A Time Out was held and the above information confirmed.  Prior to the induction of general anesthesia, antibiotic prophylaxis was administered. VTE prophylaxis was in place. General endotracheal anesthesia was then administered and tolerated well. After the induction, the abdomen was prepped with Chloraprep and draped in the sterile fashion. The patient was positioned in the supine position.  Cut down technique was used to enter the abdominal cavity and a Hasson trochar was placed after two vicryl stitches were anchored to the fascia. Pneumoperitoneum was then created with CO2 and tolerated well without any adverse changes in the patient's vital signs.  Three  8-mm ports were placed under direct vision. All skin incisions  were infiltrated with a local anesthetic agent before making the incision and placing the trocars.   The patient was positioned  in reverse Trendelenburg, robot was brought to the surgical field and docked in the standard fashion.  We made sure all the instrumentation was kept indirect view at all times and that there were no collision between the arms. I scrubbed out and went to the console.  The gallbladder was identified, It was chronically inflamed, very large liver due to fatty infiltration. The fundus grasped and retracted cephalad. Adhesions were lysed bluntly. The infundibulum was grasped and retracted laterally, exposing the peritoneum overlying the triangle of Calot. This was then divided and exposed in a blunt fashion. An extended critical view of the cystic duct and cystic artery was obtained.  The cystic duct was clearly identified and bluntly dissected.   Artery and duct were double clipped and divided. Please note that there was significant inflammation around the neck of the GB, the cystic duct was also inflamed and very large. I had to use the extra large clip due to the size of the cystic duct. Using ICG cholangiography we visualized the cystic duct and CBD w/o evidence of bile injuries. The gallbladder was taken from the gallbladder fossa in a retrograde fashion with the electrocautery.  Hemostasis was achieved with the electrocautery. nspection of the right upper quadrant was performed. No bleeding, bile duct injury or leak, or bowel injury was noted. Robotic instruments and robotic arms were undocked in the standard fashion.  I scrubbed back in.  The gallbladder was removed and placed in an Endocatch bag.  Given the inflammation I placed 19 FR blake drain in the GB fossa.  Pneumoperitoneum was released.  The periumbilical port site was closed with interrumpted 0 Vicryl sutures. 4-0 subcuticular Monocryl was used to close  the skin. Dermabond was  applied.  The patient was then extubated and brought to the recovery room in stable condition. Sponge, lap, and needle counts were correct at closure and at the conclusion of the case.               Sterling Big, MD, FACS

## 2022-12-19 NOTE — Progress Notes (Signed)
The patient underwent an ERCP yesterday with 4 large stones being removed after sphincterotomy and balloon dilation of the ampulla.  The patient's liver enzymes are improving today and there is no sign of elevated lipase.  The patient was planned to have a laparoscopic cholecystectomy today.  Nothing further to do from a GI point of view at this time.  I will sign off.  Please call if any further GI concerns or questions.  We would like to thank you for the opportunity to participate in the care of Kathryn Torres.

## 2022-12-20 ENCOUNTER — Other Ambulatory Visit: Payer: Self-pay

## 2022-12-20 ENCOUNTER — Encounter: Payer: Self-pay | Admitting: Obstetrics and Gynecology

## 2022-12-20 DIAGNOSIS — K8050 Calculus of bile duct without cholangitis or cholecystitis without obstruction: Secondary | ICD-10-CM | POA: Diagnosis not present

## 2022-12-20 LAB — CBC
HCT: 36 % (ref 36.0–46.0)
Hemoglobin: 11.8 g/dL — ABNORMAL LOW (ref 12.0–15.0)
MCH: 28.2 pg (ref 26.0–34.0)
MCHC: 32.8 g/dL (ref 30.0–36.0)
MCV: 86.1 fL (ref 80.0–100.0)
Platelets: 211 10*3/uL (ref 150–400)
RBC: 4.18 MIL/uL (ref 3.87–5.11)
RDW: 12.9 % (ref 11.5–15.5)
WBC: 10 10*3/uL (ref 4.0–10.5)
nRBC: 0 % (ref 0.0–0.2)

## 2022-12-20 LAB — COMPREHENSIVE METABOLIC PANEL
ALT: 180 U/L — ABNORMAL HIGH (ref 0–44)
AST: 73 U/L — ABNORMAL HIGH (ref 15–41)
Albumin: 3 g/dL — ABNORMAL LOW (ref 3.5–5.0)
Alkaline Phosphatase: 189 U/L — ABNORMAL HIGH (ref 38–126)
Anion gap: 8 (ref 5–15)
BUN: 10 mg/dL (ref 6–20)
CO2: 27 mmol/L (ref 22–32)
Calcium: 9.1 mg/dL (ref 8.9–10.3)
Chloride: 106 mmol/L (ref 98–111)
Creatinine, Ser: 0.79 mg/dL (ref 0.44–1.00)
GFR, Estimated: >60 mL/min (ref >60)
Glucose, Bld: 103 mg/dL — ABNORMAL HIGH (ref 70–99)
Potassium: 4.4 mmol/L (ref 3.5–5.1)
Sodium: 141 mmol/L (ref 135–145)
Total Bilirubin: 1.5 mg/dL — ABNORMAL HIGH (ref 0.3–1.2)
Total Protein: 6.4 g/dL — ABNORMAL LOW (ref 6.5–8.1)

## 2022-12-20 LAB — URINE CULTURE

## 2022-12-20 MED ORDER — CEFDINIR 300 MG PO CAPS
300.0000 mg | ORAL_CAPSULE | Freq: Two times a day (BID) | ORAL | 0 refills | Status: AC
  Filled 2022-12-20: qty 12, 6d supply, fill #0

## 2022-12-20 MED ORDER — IBUPROFEN 400 MG PO TABS: 400.0000 mg | ORAL_TABLET | Freq: Four times a day (QID) | ORAL | 0 refills | Status: DC | PRN

## 2022-12-20 MED ORDER — METRONIDAZOLE 500 MG PO TABS
500.0000 mg | ORAL_TABLET | Freq: Three times a day (TID) | ORAL | 0 refills | Status: AC
  Filled 2022-12-20: qty 18, 6d supply, fill #0

## 2022-12-20 MED ORDER — ONDANSETRON 8 MG PO TBDP
8.0000 mg | ORAL_TABLET | Freq: Three times a day (TID) | ORAL | 0 refills | Status: DC | PRN
  Filled 2022-12-20: qty 18, 21d supply, fill #0

## 2022-12-20 MED ORDER — ACETAMINOPHEN 500 MG PO TABS: 1000.0000 mg | ORAL_TABLET | Freq: Four times a day (QID) | ORAL | 0 refills | Status: DC

## 2022-12-20 NOTE — Progress Notes (Signed)
Temp rechecked, 99.9 orally. Per Dr. Denton Lank patient discharged cancelled. Will keep patient overnight and reevaluate in the morning. Patient agrees with plan.   Madie Reno, RN

## 2022-12-20 NOTE — Plan of Care (Signed)
  Problem: Education: Goal: Knowledge of General Education information will improve Description: Including pain rating scale, medication(s)/side effects and non-pharmacologic comfort measures Outcome: Progressing   Problem: Health Behavior/Discharge Planning: Goal: Ability to manage health-related needs will improve Outcome: Progressing   Problem: Activity: Goal: Risk for activity intolerance will decrease Outcome: Progressing   Problem: Nutrition: Goal: Adequate nutrition will be maintained Outcome: Progressing   Problem: Pain Managment: Goal: General experience of comfort will improve Outcome: Progressing   Problem: Skin Integrity: Goal: Risk for impaired skin integrity will decrease Outcome: Progressing   

## 2022-12-20 NOTE — Progress Notes (Signed)
Patient temp 99.7. Dr. Denton Lank aware. Patient receiving IV antibiotic prior to discharge. Will recheck temp later and reevaluate. Patient agrees with plan.    Madie Reno, RN

## 2022-12-20 NOTE — Discharge Summary (Signed)
Physician Discharge Summary   Patient: Kathryn Torres MRN: [id number] DOB: 05-05-1961  Admit date:     12/17/2022  Discharge date: 12/21/2022  Discharge Physician: Pennie Banter   PCP: Sherlene Shams, MD   Recommendations at discharge:    Follow up with General Surgery as scheduled Monitor & record drain output and bring this info to surgery follow up appt Follow up with Primary Care in 1-2 weeks Repeat CBC, CMP in 1-2 weeks Follow up on BP.  Losartan held at d/c as BP's were controlled with it held in the hospital.  Resume losartan when BP will tolerate it. Follow pending urine culture results. Pt clinically improving on Rocephin / Omnicef.  Discharge Diagnoses: Principal Problem:   Choledocholithiasis Active Problems:   Hypertension   Obesity (BMI 30-39.9)   UTI (urinary tract infection)  Resolved Problems:   AKI (acute kidney injury) [facility])  Hospital Course: HPI on admission 12/17/22 by Dr. Ashok Pall: "Kathryn Torres is a 62 y.o. female with medical history significant for htn, obesity, and gallstones, presents with the above.   Hx biliary colic, gets the occasional attack but it is mild and has avoided surgery thus far. Several days ago developed severe epigastric abdominal pain that has come and gone, currently just a nagging pain. Also with nausea and nbnb emesis. Is passing stool and flatus, no diarrhea. No fever or chills. Went to an urgent care today where labs were abnormal and u/s consistent w/ choledocholithiasis, referred to our ER. "  Consults: General surgery, GI  6/28: pt was to be discharged, but late afternoon, developed low grade fevers and was not feeling very well. Discharge was delayed.  6/29: Pt feeling better.  Temps normalized.  Medically stable for d/c today.   Assessment and Plan: * Choledocholithiasis Elevated LFT's GI and Gen Surgery following ERCP done 6/26 Robotic-assisted lap chole 6/27 Pt doing well POD -1 today. --Stop IV fluids --Diet  advanced and pt is tolerating well so far --Discharge on oral Omnicef & Flagyl to complete 7 day course, per surgery given degree of inflammation seen in surgery --Repeat CMP at follow up --Follow up in surgery clinic --Drain remains in place until follow up, monitor output at home.  RN did drain teaching/educations.   UTI (urinary tract infection) Pre-op UA 6/27 concerning for infection. Pt notes dysuria. --Started Rocephin >> dicsharge on Omnicef for UTI and abdominal coverage per surgery --Follow urine culture results to final  Obesity (BMI 30-39.9) Body mass index is 35.07 kg/m. Complicates overall care and prognosis.  Recommend lifestyle modifications including physical activity and diet for weight loss and overall long-term health.  Hypertension Continue home metoprolol Hold losartan at d/c given soft diastolic BP's. --Monitor BP's at home.  --Pt given BP parameters to resume when safe.   --Close PCP follow up   AKI (acute kidney injury) (HCC)-resolved as of 12/20/2022 Mild, Cr 1.29 on admission likely due to dehydration and poor PO Intake. Treated with IV fluids & resolved. --Monitor BMP at follow up         Consultants: GI, Surgery Procedures performed: ERCP, lap chole   Disposition: Home  Diet recommendation:  Discharge Diet Orders (From admission, onward)     Start     Ordered   12/20/22 0000  Diet - low sodium heart healthy        12/20/22 1453            DISCHARGE MEDICATION: Allergies as of 12/20/2022  Reactions   Contrast Media [iodinated Contrast Media]         Medication List     STOP taking these medications    folic acid 1 MG tablet Commonly known as: FOLVITE   losartan 50 MG tablet Commonly known as: COZAAR   Shingrix injection Generic drug: Zoster Vaccine Adjuvanted   triamcinolone cream 0.1 % Commonly known as: KENALOG       TAKE these medications    acetaminophen 500 MG tablet Commonly known as:  TYLENOL Take 2 tablets (1,000 mg total) by mouth every 6 (six) hours.   cefdinir 300 MG capsule Commonly known as: OMNICEF Take 1 capsule (300 mg total) by mouth 2 (two) times daily for 6 days.   cholecalciferol 1000 units tablet Commonly known as: VITAMIN D Take 1,000 Units by mouth daily.   cyanocobalamin 1000 MCG tablet Commonly known as: VITAMIN B12 Take 1 tablet (1,000 mcg total) by mouth daily.   ibuprofen 400 MG tablet Commonly known as: ADVIL Take 1-1.5 tablets (400-600 mg total) by mouth every 6 (six) hours as needed for moderate pain.   metoprolol succinate 25 MG 24 hr tablet Commonly known as: TOPROL-XL Take 1 tablet (25 mg total) by mouth daily.   metroNIDAZOLE 500 MG tablet Commonly known as: Flagyl Take 1 tablet (500 mg total) by mouth 3 (three) times daily for 6 days.   ondansetron 8 MG disintegrating tablet Commonly known as: ZOFRAN-ODT Take 1 tablet (8 mg total) by mouth every 8 (eight) hours as needed for nausea or vomiting. What changed:  medication strength how much to take        Follow-up Information     Donovan Kail, PA-C. Go on 12/24/2022.   Specialty: Physician Assistant Why: Go to appointment on 07/02 at 1:15 PM Contact information: [address] [place] [address] [phone]                Discharge Exam: Ceasar Mons Weights   12/17/22 1404 12/18/22 1322  Weight: 89.8 kg 89.8 kg   General exam: awake, alert, no acute distress HEENT: atraumatic, clear conjunctiva, anicteric sclera, moist mucus membranes, hearing grossly normal  Respiratory system: CTAB, no wheezes, rales or rhonchi, normal respiratory effort. Cardiovascular system: normal S1/S2, RRR, no pedal edema.   Gastrointestinal system: soft, expected post op tenderness, ND,  drain in right side abdomen Central nervous system: A&O x 4. no gross focal neurologic deficits, normal speech Extremities: moves all , no edema, normal tone Skin: dry, intact, normal  temperature, normal color, No rashes, lesions or ulcers Psychiatry: normal mood, congruent affect, judgement and insight appear normal   Condition at discharge: stable  The results of significant diagnostics from this hospitalization (including imaging, microbiology, ancillary and laboratory) are listed below for reference.   Imaging Studies: DG C-Arm 1-60 Min-No Report  Result Date: 12/18/2022 Fluoroscopy was utilized by the requesting physician.  No radiographic interpretation.   US Abdomen Complete  Result Date: 12/17/2022 CLINICAL DATA:  Abdominal pain, known gallstones EXAM: ABDOMEN ULTRASOUND COMPLETE COMPARISON:  03/05/2019 CT abdomen pelvis FINDINGS: Gallbladder: Thickening of the gallbladder wall, which measures 4 mm, just above the upper limit of normal. No pericholecystic fluid. No sonographic Murphy sign noted by sonographer. Multiple calculi, the largest of which measures up to 13 mm. Common bile duct: Diameter: 9 mm within the liver, above the upper limit of normal. Intrahepatic biliary ductal dilatation. Possible stone in the common bile duct near the pancreatic head, at which point the duct  measures up to 21 mm. Liver: No focal lesion identified. Within normal limits in parenchymal echogenicity. Portal vein is patent on color Doppler imaging with normal direction of blood flow towards the liver. IVC: No abnormality visualized. Pancreas: Visualized portion unremarkable. Spleen: Size and appearance within normal limits. Right Kidney: Length: 10.4 cm. Echogenicity within normal limits. No mass or hydronephrosis visualized. Left Kidney: Length: 11.9 cm. Echogenicity within normal limits. No mass or hydronephrosis visualized. Abdominal aorta: No aneurysm visualized. Other findings: None. IMPRESSION: 1. Likely obstructing stone in the common bile duct near the pancreatic head, with intrahepatic and extrahepatic biliary ductal dilatation. A CT abdomen pelvis with contrast is recommended for  further evaluation. 2. Cholelithiasis with gallbladder wall thickening, possibly related to the aforementioned obstruction. No pericholecystic fluid or sonographic Murphy sign. These results will be called to the ordering clinician or representative by the Radiologist Assistant, and communication documented in the PACS or [employer]. Electronically Signed   By: Wiliam Ke M.D.   On: 12/17/2022 13:15    Microbiology: Results for orders placed or performed during the hospital encounter of 12/17/22  Urine Culture     Status: Abnormal (Preliminary result)   Collection Time: 12/19/22  6:45 AM   Specimen: Urine, Random  Result Value Ref Range Status   Specimen Description   Final    URINE, RANDOM Performed at [facility], [address] [place], [place] [address]    Special Requests   Final    NONE Performed at [facility], [address], [place], [place] [address]    Culture (A)  Final    >=100,000 COLONIES/mL ESCHERICHIA COLI SUSCEPTIBILITIES TO FOLLOW Performed at [facility], [address] [place], [place] [address]    Report Status PENDING  Incomplete    Labs: CBC: Recent Labs  Lab 12/17/22 1406 12/18/22 0633 12/19/22 0411 12/20/22 0838  WBC 9.9  10.1 5.5 6.7 10.0  HGB 15.1*  15.3* 12.3 11.8* 11.8*  HCT 46.9*  46.6* 36.9 35.7* 36.0  MCV 85.9  85.5 85.6 85.0 86.1  PLT 371  365 220 234 211   Basic Metabolic Panel: Recent Labs  Lab 12/17/22 1102 12/17/22 1406 12/18/22 0633 12/19/22 0411 12/20/22 0838  NA 133* 134* 138 136 141  K 4.3 3.8 3.8 3.6 4.4  CL 99 99 106 105 106  CO2 25 20* 25 23 27   GLUCOSE 93 101* 117* 109* 103*  BUN 25* 26* 24* 16 10  CREATININE 1.30* 1.29* 1.07* 0.90 0.79  CALCIUM 9.6 10.2 9.1 8.6* 9.1   Liver Function Tests: Recent Labs  Lab 12/17/22 1102 12/17/22 1406 12/18/22 0633 12/19/22 0411 12/20/22 0838  AST 172* 183* 151* 100* 73*  ALT 335* 350* 272* 224* 180*  ALKPHOS 322* 349* 229* 206*  189*  BILITOT 2.9* 3.2* 2.2* 1.8* 1.5*  PROT 8.1 8.6* 6.4* 6.2* 6.4*  ALBUMIN 3.8 4.3 3.2* 3.1* 3.0*   CBG: No results for input(s): "GLUCAP" in the last 168 hours.  Discharge time spent: greater than 30 minutes.  Signed: Pennie Banter, DO [facility] 12/20/2022

## 2022-12-20 NOTE — Progress Notes (Signed)
[facility] SURGICAL PROGRESS NOTE  Hospital Day(s): 3.   Post op day(s): 1 Day Post-Op.   Interval History:  Patient seen and examined No acute events or new complaints overnight.  Patient reports she is doing well this morning Expected soreness No fever, chills, nausea, emesis No new labs this morning  Drain output unmeasured; has about 30-40 ccs of serosanguinous currently Diet advancing; tolerating   Vital signs in last 24 hours: [min-max] current  Temp:  [96.7 F (35.9 C)-98.5 F (36.9 C)] 98.4 F (36.9 C) (06/28 0454) Pulse Rate:  [63-74] 73 (06/28 0454) Resp:  [12-25] 18 (06/28 0454) BP: (109-146)/(49-65) 116/50 (06/28 0454) SpO2:  [97 %-100 %] 98 % (06/28 0454)     Height: 5\' 3"  (160 cm) Weight: 89.8 kg BMI (Calculated): 35.08   Intake/Output last 2 shifts:  06/27 0701 - 06/28 0700 In: 5271.8 [P.O.:720; I.V.:4241.7; IV Piggyback:310.1] Out: -    Physical Exam:  Constitutional: alert, cooperative and no distress  Respiratory: breathing non-labored at rest  Cardiovascular: regular rate and sinus rhythm  Gastrointestinal: Soft, expected incisional soreness, non-distended, no rebound/guarding. Surgical drain in right lateral port site; output serosanguinous  Integumentary: Laparoscopic incisions are CDI with dermabond, no erythema, early ecchymosis   Labs:     Latest Ref Rng & Units 12/19/2022    4:11 AM 12/18/2022    6:33 AM 12/17/2022    2:06 PM  CBC  WBC 4.0 - 10.5 K/uL 6.7  5.5  10.1    9.9   Hemoglobin 12.0 - 15.0 g/dL 19.1  47.8  29.5    62.1   Hematocrit 36.0 - 46.0 % 35.7  36.9  46.6    46.9   Platelets 150 - 400 K/uL 234  220  365    371       Latest Ref Rng & Units 12/19/2022    4:11 AM 12/18/2022    6:33 AM 12/17/2022    2:06 PM  CMP  Glucose 70 - 99 mg/dL 308  657  846   BUN 6 - 20 mg/dL 16  24  26    Creatinine 0.44 - 1.00 mg/dL 9.62  9.52  8.41   Sodium 135 - 145 mmol/L 136  138  134   Potassium 3.5 - 5.1 mmol/L 3.6  3.8   3.8   Chloride 98 - 111 mmol/L 105  106  99   CO2 22 - 32 mmol/L 23  25  20    Calcium 8.9 - 10.3 mg/dL 8.6  9.1  32.4   Total Protein 6.5 - 8.1 g/dL 6.2  6.4  8.6   Total Bilirubin 0.3 - 1.2 mg/dL 1.8  2.2  3.2   Alkaline Phos 38 - 126 U/L 206  229  349   AST 15 - 41 U/L 100  151  183   ALT 0 - 44 U/L 224  272  350      Imaging studies: No new pertinent imaging studies   Assessment/Plan:  62 y.o. female 1 Day Post-Op s/p robotic assisted laparoscopic cholecystectomy for choledocholithiasis   - Okay to advance diet as tolerated; reviewed dietary recommendations s/p cholecystectomy - Continue IV Abx (Cipro/Flagyl); would recommend PO for home x7 days  - Monitor abdominal examination; on-going bowel function - Continue surgical drain; monitor and record output; she will DC home with this   - Pain control prn; antiemetics prn - Mobilize - Further management per primary service     - Discharge Planning: Okay for home  from surgical perspective. Abx as above. She will go home with drain. Will update follow up and discharge instructions.   All of the above findings and recommendations were discussed with the patient, and the medical team, and all of patient's questions were answered to her expressed satisfaction.  -- Lynden Oxford, PA-C [facility] 12/20/2022, 7:36 AM M-F: 7am - 4pm

## 2022-12-20 NOTE — TOC Transition Note (Signed)
Transition of Care [facility]) - CM/SW Discharge Note   Patient Details  Name: Kathryn Torres MRN: [id number] Date of Birth: 1961-01-10  Transition of Care [facility]) CM/SW Contact:  Kreg Shropshire, RN Phone Number: 12/20/2022, 3:13 PM   Clinical Narrative:     Transition of Care [facility]) - Inpatient Brief Assessment   Patient Details  Name: Kathryn Torres MRN: [id number] Date of Birth: [birth year]  Transition of Care [facility]) CM/SW Contact:    Kreg Shropshire, RN Phone Number: 12/20/2022, 3:13 PM   Clinical Narrative:  No toc needs at this time.  Transition of Care Asessment: Insurance and Status: Insurance coverage has been reviewed Patient has primary care physician: Yes     Prior/Current Home Services: No current home services Social Determinants of Health Reivew: SDOH reviewed no interventions necessary Readmission risk has been reviewed: Yes Transition of care needs: no transition of care needs at this time         Patient Goals and CMS Choice      Discharge Placement                         Discharge Plan and Services Additional resources added to the After Visit Summary for                                       Social Determinants of Health (SDOH) Interventions SDOH Screenings   Food Insecurity: No Food Insecurity (12/18/2022)  Housing: Low Risk  (12/18/2022)  Transportation Needs: No Transportation Needs (12/18/2022)  Utilities: Not At Risk (12/18/2022)  Depression (PHQ2-9): Low Risk  (01/14/2022)  Tobacco Use: Low Risk  (12/19/2022)     Readmission Risk Interventions     No data to display

## 2022-12-21 LAB — COMPREHENSIVE METABOLIC PANEL
ALT: 132 U/L — ABNORMAL HIGH (ref 0–44)
AST: 41 U/L (ref 15–41)
Albumin: 3.1 g/dL — ABNORMAL LOW (ref 3.5–5.0)
Alkaline Phosphatase: 175 U/L — ABNORMAL HIGH (ref 38–126)
Anion gap: 7 (ref 5–15)
BUN: 8 mg/dL (ref 6–20)
CO2: 25 mmol/L (ref 22–32)
Calcium: 8.8 mg/dL — ABNORMAL LOW (ref 8.9–10.3)
Chloride: 105 mmol/L (ref 98–111)
Creatinine, Ser: 0.74 mg/dL (ref 0.44–1.00)
GFR, Estimated: >60 mL/min (ref >60)
Glucose, Bld: 108 mg/dL — ABNORMAL HIGH (ref 70–99)
Potassium: 3.8 mmol/L (ref 3.5–5.1)
Sodium: 137 mmol/L (ref 135–145)
Total Bilirubin: 1.2 mg/dL (ref 0.3–1.2)
Total Protein: 6.5 g/dL (ref 6.5–8.1)

## 2022-12-21 LAB — URINE CULTURE: Culture: >=100000 — AB

## 2022-12-21 LAB — CBC
HCT: 36.8 % (ref 36.0–46.0)
Hemoglobin: 12.3 g/dL (ref 12.0–15.0)
MCH: 28.6 pg (ref 26.0–34.0)
MCHC: 33.4 g/dL (ref 30.0–36.0)
MCV: 85.6 fL (ref 80.0–100.0)
Platelets: 248 10*3/uL (ref 150–400)
RBC: 4.3 MIL/uL (ref 3.87–5.11)
RDW: 13.1 % (ref 11.5–15.5)
WBC: 8.7 10*3/uL (ref 4.0–10.5)
nRBC: 0 % (ref 0.0–0.2)

## 2022-12-21 NOTE — Progress Notes (Signed)
Patient discharged home. Patient teaching done with JP drain. Patient was able to demonstrate emptying the JP drain verbalized understanding of dressing changes. No further concerns identified

## 2022-12-21 NOTE — Plan of Care (Signed)
  Problem: Education: Goal: Knowledge of General Education information will improve Description: Including pain rating scale, medication(s)/side effects and non-pharmacologic comfort measures Outcome: Progressing   Problem: Health Behavior/Discharge Planning: Goal: Ability to manage health-related needs will improve Outcome: Progressing   Problem: Clinical Measurements: Goal: Ability to maintain clinical measurements within normal limits will improve Outcome: Progressing Goal: Will remain free from infection Outcome: Progressing Goal: Diagnostic test results will improve Outcome: Progressing Goal: Respiratory complications will improve Outcome: Progressing Goal: Cardiovascular complication will be avoided Outcome: Progressing   Problem: Pain Managment: Goal: General experience of comfort will improve Outcome: Progressing   Problem: Skin Integrity: Goal: Risk for impaired skin integrity will decrease Outcome: Progressing   Problem: Elimination: Goal: Will not experience complications related to bowel motility Outcome: Progressing Goal: Will not experience complications related to urinary retention Outcome: Progressing   Problem: Skin Integrity: Goal: Risk for impaired skin integrity will decrease Outcome: Progressing   Problem: Skin Integrity: Goal: Risk for impaired skin integrity will decrease Outcome: Progressing

## 2022-12-23 ENCOUNTER — Telehealth: Payer: Self-pay

## 2022-12-23 NOTE — Transitions of Care (Post Inpatient/ED Visit) (Signed)
   12/23/2022  Name: Kathryn Torres MRN: [id number] DOB: 1961-03-12  Today's TOC FU Call Status: Today's TOC FU Call Status:: Successful TOC FU Call Competed TOC FU Call Complete Date: 12/23/22  Transition Care Management Follow-up Telephone Call Date of Discharge: 12/21/22 Discharge Facility: [facility]) Type of Discharge: Inpatient Admission Primary Inpatient Discharge Diagnosis:: cholecystitis How have you been since you were released from the hospital?: Better Any questions or concerns?: No  Items Reviewed: Did you receive and understand the discharge instructions provided?: Yes Medications obtained,verified, and reconciled?: Yes (Medications Reviewed) Any new allergies since your discharge?: No Dietary orders reviewed?: Yes Do you have support at home?: Yes People in Home: sibling(s)  Medications Reviewed Today: Medications Reviewed Today     Reviewed by Karena Addison, LPN (Licensed Practical Nurse) on 12/23/22 at 1047  Med List Status: <None>   Medication Order Taking? Sig Documenting Provider Last Dose Status Informant  acetaminophen (TYLENOL) 500 MG tablet [id number]  Take 2 tablets (1,000 mg total) by mouth every 6 (six) hours. Esaw Grandchild A, DO  Active   cefdinir (OMNICEF) 300 MG capsule [id number]  Take 1 capsule (300 mg total) by mouth 2 (two) times daily for 6 days. Esaw Grandchild A, DO  Active   cholecalciferol (VITAMIN D) 1000 units tablet [id number] No Take 1,000 Units by mouth daily. [provider] 12/12/2022 Active Self           Med Note Aundria Rud, Landry Dyke   Tue Dec 17, 2022  4:54 PM)    ibuprofen (ADVIL) 400 MG tablet [id number]  Take 1-1.5 tablets (400-600 mg total) by mouth every 6 (six) hours as needed for moderate pain. Esaw Grandchild A, DO  Active   metoprolol succinate (TOPROL-XL) 25 MG 24 hr tablet [id number] No Take 1 tablet (25 mg total) by mouth daily. Sherlene Shams, MD 12/12/2022 Active Self  metroNIDAZOLE  (FLAGYL) 500 MG tablet [id number]  Take 1 tablet (500 mg total) by mouth 3 (three) times daily for 6 days. Esaw Grandchild A, DO  Active   ondansetron (ZOFRAN-ODT) 8 MG disintegrating tablet [id number]  Take 1 tablet (8 mg total) by mouth every 8 (eight) hours as needed for nausea or vomiting. Pennie Banter, DO  Active   vitamin B-12 (CYANOCOBALAMIN) 1000 MCG tablet [id number] No Take 1 tablet (1,000 mcg total) by mouth daily. Sherlene Shams, MD 12/12/2022 Active Self            Home Care and Equipment/Supplies: Were Home Health Services Ordered?: NA Any new equipment or medical supplies ordered?: NA  Functional Questionnaire: Do you need assistance with bathing/showering or dressing?: No Do you need assistance with meal preparation?: No Do you need assistance with eating?: No Do you have difficulty maintaining continence: No Do you need assistance with getting out of bed/getting out of a chair/moving?: No Do you have difficulty managing or taking your medications?: No  Follow up appointments reviewed: PCP Follow-up appointment confirmed?: No (no appt avail , sent message to staff to schedule) MD Provider Line Number:[phone] Given: No Specialist Hospital Follow-up appointment confirmed?: Yes Date of Specialist follow-up appointment?: 12/24/22 Follow-Up Specialty Provider:: Manus Rudd Do you need transportation to your follow-up appointment?: No Do you understand care options if your condition(s) worsen?: Yes-patient verbalized understanding    SIGNATURE Karena Addison, LPN [facility] Nurse Health Advisor Direct Dial [phone]

## 2022-12-24 ENCOUNTER — Ambulatory Visit (INDEPENDENT_AMBULATORY_CARE_PROVIDER_SITE_OTHER): Payer: BC Managed Care – PPO | Admitting: Physician Assistant

## 2022-12-24 ENCOUNTER — Encounter: Payer: BC Managed Care – PPO | Admitting: Physician Assistant

## 2022-12-24 ENCOUNTER — Encounter: Payer: Self-pay | Admitting: Physician Assistant

## 2022-12-24 VITALS — BP 140/82 | HR 99 | Temp 97.7°F | Ht 63.5 in | Wt 205.6 lb

## 2022-12-24 DIAGNOSIS — K8020 Calculus of gallbladder without cholecystitis without obstruction: Secondary | ICD-10-CM

## 2022-12-24 DIAGNOSIS — Z9889 Other specified postprocedural states: Secondary | ICD-10-CM

## 2022-12-24 DIAGNOSIS — K8050 Calculus of bile duct without cholangitis or cholecystitis without obstruction: Secondary | ICD-10-CM

## 2022-12-24 DIAGNOSIS — Z09 Encounter for follow-up examination after completed treatment for conditions other than malignant neoplasm: Secondary | ICD-10-CM

## 2022-12-24 NOTE — Progress Notes (Signed)
[facility] POST-OP OFFICE VISIT  12/24/2022  HPI: Kathryn Torres is a 62 y.o. female 5 days s/p robotic assisted laparoscopic cholecystectomy for choledocholithiasis   She is overall doing well Abdomen is sore expectedly at this stage No fever, chills, nausea, emesis She is tolerating PO; no diarrhea Drain serous; <20 ccs daily Incisions healing well Ambulating without issues   Vital signs: BP (!) 140/82   Pulse 99   Temp 97.7 F (36.5 C) (Oral)   Ht 5' 3.5" (1.613 m)   Wt 205 lb 9.6 oz (93.3 kg)   LMP 06/14/2016 (Exact Date)   SpO2 98%   BMI 35.85 kg/m    Physical Exam: Constitutional: Well appearing female, NAD Abdomen: Soft, non-tender, non-distended, no rebound/guarding. Surgical drain in right lateral port site; serous (removed) Skin: Laparoscopic incisions are healing well, there is ecchymosis in various healing stages, no erythema or drainage   Assessment/Plan: This is a 62 y.o. female 5 days s/p robotic assisted laparoscopic cholecystectomy for choledocholithiasis    - Drain removed; dressing place   - Pain control prn  - Reviewed wound care recommendation  - Reviewed lifting restrictions; 4 weeks total  - Reviewed surgical pathology; CCC  - She can follow up on as needed basis; She understands to call with questions/concerns  -- Lynden Oxford, PA-C [facility] 12/24/2022, 1:17 PM M-F: 7am - 4pm

## 2022-12-24 NOTE — Patient Instructions (Signed)

## 2023-01-02 ENCOUNTER — Encounter: Payer: BC Managed Care – PPO | Admitting: Physician Assistant

## 2023-01-20 ENCOUNTER — Encounter: Payer: BC Managed Care – PPO | Admitting: Internal Medicine

## 2023-01-22 ENCOUNTER — Encounter: Payer: BC Managed Care – PPO | Admitting: Internal Medicine

## 2023-01-23 ENCOUNTER — Other Ambulatory Visit: Payer: Self-pay

## 2023-01-23 ENCOUNTER — Encounter: Payer: Self-pay | Admitting: Internal Medicine

## 2023-01-23 ENCOUNTER — Ambulatory Visit (INDEPENDENT_AMBULATORY_CARE_PROVIDER_SITE_OTHER): Payer: BC Managed Care – PPO | Admitting: Internal Medicine

## 2023-01-23 VITALS — BP 126/80 | HR 92 | Temp 98.1°F | Ht 63.5 in | Wt 204.2 lb

## 2023-01-23 DIAGNOSIS — Z6835 Body mass index (BMI) 35.0-35.9, adult: Secondary | ICD-10-CM

## 2023-01-23 DIAGNOSIS — Z0000 Encounter for general adult medical examination without abnormal findings: Secondary | ICD-10-CM | POA: Diagnosis not present

## 2023-01-23 DIAGNOSIS — E782 Mixed hyperlipidemia: Secondary | ICD-10-CM | POA: Diagnosis not present

## 2023-01-23 DIAGNOSIS — K8020 Calculus of gallbladder without cholecystitis without obstruction: Secondary | ICD-10-CM

## 2023-01-23 DIAGNOSIS — E669 Obesity, unspecified: Secondary | ICD-10-CM

## 2023-01-23 DIAGNOSIS — N3000 Acute cystitis without hematuria: Secondary | ICD-10-CM

## 2023-01-23 DIAGNOSIS — R748 Abnormal levels of other serum enzymes: Secondary | ICD-10-CM | POA: Diagnosis not present

## 2023-01-23 DIAGNOSIS — Z09 Encounter for follow-up examination after completed treatment for conditions other than malignant neoplasm: Secondary | ICD-10-CM

## 2023-01-23 DIAGNOSIS — I10 Essential (primary) hypertension: Secondary | ICD-10-CM | POA: Diagnosis not present

## 2023-01-23 DIAGNOSIS — K8050 Calculus of bile duct without cholangitis or cholecystitis without obstruction: Secondary | ICD-10-CM | POA: Diagnosis not present

## 2023-01-23 DIAGNOSIS — Z0001 Encounter for general adult medical examination with abnormal findings: Secondary | ICD-10-CM

## 2023-01-23 LAB — CBC WITH DIFFERENTIAL/PLATELET
Basophils Absolute: 0.1 10*3/uL (ref 0.0–0.1)
Basophils Relative: 0.7 % (ref 0.0–3.0)
Eosinophils Absolute: 0.2 10*3/uL (ref 0.0–0.7)
Eosinophils Relative: 2 % (ref 0.0–5.0)
HCT: 40.3 % (ref 36.0–46.0)
Hemoglobin: 13.5 g/dL (ref 12.0–15.0)
Lymphocytes Relative: 24.1 % (ref 12.0–46.0)
Lymphs Abs: 1.9 10*3/uL (ref 0.7–4.0)
MCHC: 33.4 g/dL (ref 30.0–36.0)
MCV: 85 fl (ref 78.0–100.0)
Monocytes Absolute: 0.5 10*3/uL (ref 0.1–1.0)
Monocytes Relative: 6.3 % (ref 3.0–12.0)
Neutro Abs: 5.2 10*3/uL (ref 1.4–7.7)
Neutrophils Relative %: 66.9 % (ref 43.0–77.0)
Platelets: 287 10*3/uL (ref 150.0–400.0)
RBC: 4.74 Mil/uL (ref 3.87–5.11)
RDW: 13.9 % (ref 11.5–15.5)
WBC: 7.8 10*3/uL (ref 4.0–10.5)

## 2023-01-23 LAB — TSH: TSH: 1.04 u[IU]/mL (ref 0.35–5.50)

## 2023-01-23 LAB — LIPID PANEL
Cholesterol: 207 mg/dL — ABNORMAL HIGH (ref 0–200)
HDL: 43.1 mg/dL (ref >39.00)
LDL Cholesterol: 134 mg/dL — ABNORMAL HIGH (ref 0–99)
NonHDL: 163.92
Total CHOL/HDL Ratio: 5
Triglycerides: 148 mg/dL (ref 0.0–149.0)
VLDL: 29.6 mg/dL (ref 0.0–40.0)

## 2023-01-23 LAB — COMPREHENSIVE METABOLIC PANEL
ALT: 85 U/L — ABNORMAL HIGH (ref 0–35)
AST: 42 U/L — ABNORMAL HIGH (ref 0–37)
Albumin: 4.3 g/dL (ref 3.5–5.2)
Alkaline Phosphatase: 161 U/L — ABNORMAL HIGH (ref 39–117)
BUN: 17 mg/dL (ref 6–23)
CO2: 27 mEq/L (ref 19–32)
Calcium: 10.2 mg/dL (ref 8.4–10.5)
Chloride: 101 mEq/L (ref 96–112)
Creatinine, Ser: 0.77 mg/dL (ref 0.40–1.20)
GFR: 84.04 mL/min (ref >60.00)
Glucose, Bld: 101 mg/dL — ABNORMAL HIGH (ref 70–99)
Potassium: 4.6 mEq/L (ref 3.5–5.1)
Sodium: 139 mEq/L (ref 135–145)
Total Bilirubin: 0.8 mg/dL (ref 0.2–1.2)
Total Protein: 7.3 g/dL (ref 6.0–8.3)

## 2023-01-23 LAB — MICROALBUMIN / CREATININE URINE RATIO
Creatinine,U: 139.3 mg/dL
Microalb Creat Ratio: 0.5 mg/g (ref 0.0–30.0)
Microalb, Ur: <0.7 mg/dL (ref 0.0–1.9)

## 2023-01-23 LAB — HEMOGLOBIN A1C: Hgb A1c MFr Bld: 5.4 % (ref 4.6–6.5)

## 2023-01-23 LAB — LIPASE: Lipase: 22 U/L (ref 11.0–59.0)

## 2023-01-23 LAB — LDL CHOLESTEROL, DIRECT: Direct LDL: 135 mg/dL

## 2023-01-23 MED ORDER — METOPROLOL SUCCINATE ER 25 MG PO TB24
25.0000 mg | ORAL_TABLET | Freq: Every day | ORAL | 2 refills | Status: DC
  Filled 2023-01-23: qty 90, 90d supply, fill #0
  Filled 2023-04-18: qty 90, 90d supply, fill #1
  Filled 2023-07-17: qty 90, 90d supply, fill #2

## 2023-01-23 NOTE — Progress Notes (Signed)
Patient ID: Kathryn Torres, female    DOB: 05-31-1961  Age: 62 y.o. MRN: [id number]  The patient is here for annual preventive examination and follow up on recent hospitalization (June 25-29) .   The risk factors are reflected in the social history.   The roster of all physicians providing medical care to patient - is listed in the Snapshot section of the chart.   Activities of daily living:  The patient is 100% independent in all ADLs: dressing, toileting, feeding as well as independent mobility   Home safety : The patient has smoke detectors in the home. They wear seatbelts.  There are no unsecured firearms at home. There is no violence in the home.    There is no risks for hepatitis, STDs or HIV. There is no   history of blood transfusion. They have no travel history to infectious disease endemic areas of the world.   The patient has seen their dentist in the last six month. They have seen their eye doctor in the last year. The patinet  denies slight hearing difficulty with regard to whispered voices and some television programs.  They have deferred audiologic testing in the last year.  They do not  have excessive sun exposure. Discussed the need for sun protection: hats, long sleeves and use of sunscreen if there is significant sun exposure.    Diet: the importance of a healthy diet is discussed. They do have a healthy diet.   The benefits of regular aerobic exercise were discussed. The patient  is walking for exercise 3 days per week  for  60 minutes.    Depression screen: there are no signs or vegative symptoms of depression- irritability, change in appetite, anhedonia, sadness/tearfullness.   The following portions of the patient's history were reviewed and updated as appropriate: allergies, current medications, past family history, past medical history,  past surgical history, past social history  and problem list.   Visual acuity was not assessed per patient preference since the  patient has regular follow up with an  ophthalmologist. Hearing and body mass index were assessed and reviewed.    During the course of the visit the patient was educated and counseled about appropriate screening and preventive services including : fall prevention , diabetes screening, nutrition counseling, colorectal cancer screening, and recommended immunizations.    Chief Complaint:  Kathryn Torres is a 62 yr old female  with a history of obesity and a remote history of HELLP i who who was hospitalized from 6/25 to 6/29 at [facility] with cholecystitis secondary to cholodocholithiasis.  She underwent successful and uncompllicated  ERCP,  followed by robotoic assisted lap chole and was discharged on 6/29 with a JP drain which was removed during Surgery follow up on July 2 ,.  She was alsot reated for an E Coli UTI.  She completed a course of antibiotics (cefdinir and metronidazole) without diarrhea and has had no fevers.  However  , ne week ago had an episode of  moerately severe epigastric pain that lasted 1.5 hours  and was followed by 12 hours of nausea and recurrent emesis.  She denied fevers and  diarrhea,  urine became dark but not tea colored.  Did not seek medical attention .  Symptoms resolved spontaneously   Today she feels generally well and denies post prandial abdominal pain.  She has lost 10 lbs since hospitalization; has given up sodas ,  eating smaller meals.   Social:  no longer working for [employer]. 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   Review of Symptoms  Patient denies headache, fevers, malaise,  skin rash, eye pain, sinus congestion and sinus pain, sore throat, dysphagia,  hemoptysis , cough, dyspnea, wheezing, chest pain, palpitations, orthopnea, edema,  melena, diarrhea, constipation, flank pain, dysuria, hematuria, urinary  Frequency, nocturia, numbness, tingling, seizures,  Focal weakness, Loss of consciousness,  Tremor, insomnia, depression, anxiety, and  suicidal ideation.    Physical Exam:  BP 126/80   Pulse 92   Temp 98.1 F (36.7 C)   Ht 5' 3.5" (1.613 m)   Wt 204 lb 3.2 oz (92.6 kg)   LMP 06/14/2016 (Exact Date)   SpO2 96%   BMI 35.61 kg/m    Physical Exam Vitals reviewed.  Constitutional:      General: She is not in acute distress.    Appearance: Normal appearance. She is well-developed and normal weight. She is not ill-appearing, toxic-appearing or diaphoretic.  HENT:     Head: Normocephalic.     Right Ear: Tympanic membrane, ear canal and external ear normal. There is no impacted cerumen.     Left Ear: Tympanic membrane, ear canal and external ear normal. There is no impacted cerumen.     Nose: Nose normal.     Mouth/Throat:     Mouth: Mucous membranes are moist.     Pharynx: Oropharynx is clear.  Eyes:     General: No scleral icterus.       Right eye: No discharge.        Left eye: No discharge.     Conjunctiva/sclera: Conjunctivae normal.     Pupils: Pupils are equal, round, and reactive to light.  Neck:     Thyroid: No thyromegaly.     Vascular: No carotid bruit or JVD.  Cardiovascular:     Rate and Rhythm: Normal rate and regular rhythm.     Heart sounds: Normal heart sounds.  Pulmonary:     Effort: Pulmonary effort is normal. No respiratory distress.     Breath sounds: Normal breath sounds.  Chest:  Breasts:    Breasts are symmetrical.     Right: Normal. No swelling, inverted nipple, mass, nipple discharge, skin change or tenderness.     Left: Normal. No swelling, inverted nipple, mass, nipple discharge, skin change or tenderness.  Abdominal:     General: Bowel sounds are normal.     Palpations: Abdomen is soft. There is no mass.     Tenderness: There is no abdominal tenderness. There is no guarding or rebound.       Comments: Healing laparoscopy incisional scars   Musculoskeletal:        General: Normal range of motion.     Cervical back: Normal range of motion and neck supple.  Lymphadenopathy:      Cervical: No cervical adenopathy.     Upper Body:     Right upper body: No supraclavicular, axillary or pectoral adenopathy.     Left upper body: No supraclavicular, axillary or pectoral adenopathy.  Skin:    General: Skin is warm and dry.  Neurological:     General: No focal deficit present.     Mental Status: She is alert and oriented to person, place, and time. Mental status is at baseline.  Psychiatric:        Mood and Affect: Mood normal.        Behavior: Behavior normal.        Thought Content: Thought content  normal.        Judgment: Judgment normal.   Assessment and Plan: Primary hypertension -     Comprehensive metabolic panel -     Microalbumin / creatinine urine ratio  Hyperlipidemia, mixed -     Lipid panel -     LDL cholesterol, direct  Obesity (BMI 30-39.9) -     Hemoglobin A1c -     TSH -     CBC with Differential/Platelet  Encounter for preventive health examination  Symptomatic cholelithiasis  Elevated lipase -     Lipase  Other orders -     Metoprolol Succinate ER; Take 1 tablet (25 mg total) by mouth daily.  Dispense: 90 tablet; Refill: 2    No follow-ups on file.  Sherlene Shams, MD

## 2023-01-23 NOTE — Assessment & Plan Note (Addendum)
S/p ERCP June 26  by M[facility] Minium with 4 large stones being removed after sphincterotomy and balloon dilation of the ampulla. prior to lap chole.  Recent post operative episode of abd pain /n/v/  lasting 12 hour suspicious for having passed another stone (occurred around July 24)  if enzymes are elevated today will discuss with [facility] getting MRCP.  Recent episode suggest she may have more retained stones.  If liver enzymes are elevated witll order MRCP and discuss with [facility]

## 2023-01-23 NOTE — Assessment & Plan Note (Addendum)
Recent hospitalization for cholecystitis/choledochocholithiasis  reviewed since hospital follow up was not done .  Recent epsiode of recurrent epigastirc pain/n/v/  worrisome for more retained stones

## 2023-01-23 NOTE — Patient Instructions (Addendum)
I will discuss with Dr Servando Snare after I see your enzymes from today

## 2023-01-23 NOTE — Assessment & Plan Note (Addendum)
Patient is stable post discharge  on June 28 from [facility]  and is currently feeling fine but had an episode of epigastric pan/emesis lasting 12 hours one week ago that is suspicious for passing a stone. Repeat liver enzymes today  remain abnormal one month post operatively.  Question whether she has continued to pass stones.  MRCP/ vs ERCP?  Will discuss with GI All labs , imaging studies and progress notes from admission were reviewed with patient today     Lab Results  Component Value Date   ALT 85 (H) 01/23/2023   AST 42 (H) 01/23/2023   ALKPHOS 161 (H) 01/23/2023   BILITOT 0.8 01/23/2023

## 2023-01-24 ENCOUNTER — Telehealth: Payer: Self-pay | Admitting: Internal Medicine

## 2023-01-24 ENCOUNTER — Encounter: Payer: Self-pay | Admitting: Internal Medicine

## 2023-01-24 NOTE — Assessment & Plan Note (Signed)
I have congratulated her in  her change in eating habits and encouraged  Continued weight loss with goal of 10% of body weigh over the next 6 months using a low glycemic index diet and regular exercise a minimum of 5 days per week.

## 2023-01-24 NOTE — Telephone Encounter (Signed)
Lft pt vm to call ofc to sch MRI. thanks 

## 2023-01-24 NOTE — Assessment & Plan Note (Addendum)
She was treated during hospitalization for a pan sensitive E Coli UTI with cefdinir.

## 2023-01-24 NOTE — Addendum Note (Signed)
Addended by: Sherlene Shams on: 01/24/2023 10:03 AM   Modules accepted: Orders

## 2023-01-24 NOTE — Assessment & Plan Note (Signed)
Symptoms began in 2013 and surgery was continually deferred until June 2024.

## 2023-01-25 NOTE — Addendum Note (Signed)
Addended by: Sherlene Shams on: 01/25/2023 09:49 AM   Modules accepted: Orders

## 2023-01-31 ENCOUNTER — Ambulatory Visit
Admission: RE | Admit: 2023-01-31 | Discharge: 2023-01-31 | Disposition: A | Discharge status: Home / Self Care | Payer: BC Managed Care – PPO | Source: Ambulatory Visit | Attending: Internal Medicine | Admitting: Internal Medicine

## 2023-01-31 ENCOUNTER — Other Ambulatory Visit: Payer: Self-pay | Admitting: Internal Medicine

## 2023-01-31 DIAGNOSIS — K8050 Calculus of bile duct without cholangitis or cholecystitis without obstruction: Secondary | ICD-10-CM | POA: Diagnosis present

## 2023-01-31 DIAGNOSIS — K8020 Calculus of gallbladder without cholecystitis without obstruction: Secondary | ICD-10-CM

## 2023-01-31 MED ORDER — GADOBUTROL 1 MMOL/ML IV SOLN
9.0000 mL | Freq: Once | INTRAVENOUS | Status: AC | PRN
  Administered 2023-01-31: 9 mL via INTRAVENOUS

## 2023-02-02 ENCOUNTER — Telehealth: Payer: Self-pay | Admitting: Family Medicine

## 2023-02-02 DIAGNOSIS — K9186 Retained cholelithiasis following cholecystectomy: Secondary | ICD-10-CM

## 2023-02-02 NOTE — Telephone Encounter (Signed)
Contacted this morning by after hours service regarding imaging results from 01/31/23 with a stone CBD s/p ERCP and lap chole.  Upon chart review pt with a 1.6 cm stone in CBD, severe intra an extra hepatic BD dilation with postprocedural pneumobillia, and hepatic steatosis.  Last labs from 01/23/23. ERCP done 12/18/22 and lap chole 12/19/22.  Pt called regarding results.  Pt states she has been pain free x 1 wk.    Given findings advised to repeat labs on Monday and close f/u with GI next wk.  For any fever, chills, n/v, jaundice, worsened abd pain, etc. proceed to nearest ED.  Pt expressed understanding.  Abbe Amsterdam, MD

## 2023-02-03 ENCOUNTER — Other Ambulatory Visit: Payer: Self-pay

## 2023-02-03 ENCOUNTER — Telehealth: Payer: Self-pay

## 2023-02-03 ENCOUNTER — Telehealth: Payer: Self-pay | Admitting: Internal Medicine

## 2023-02-03 DIAGNOSIS — K8050 Calculus of bile duct without cholangitis or cholecystitis without obstruction: Secondary | ICD-10-CM

## 2023-02-03 MED FILL — Losartan Potassium Tab 50 MG: ORAL | 90 days supply | Qty: 90 | Fill #1 | Status: AC

## 2023-02-03 NOTE — Telephone Encounter (Signed)
Pt called in stating that he would like to ask Dr. Darrick Huntsman nurse a question concerning a procedure she's having on Thursday and some blood work orders Dr. Salomon Fick put it for her.

## 2023-02-03 NOTE — Telephone Encounter (Signed)
Spoke with pt and she stated that she is scheduled to have the procedure with Dr. Servando Snare done on Thursday. Pt stated that over the weekend Dr. Salomon Fick called and wanted her to have additional blood work done today but pt isn't sure if she needs to have that done on since procedure is scheduled for Thursday. I advised pt that Dr. Darrick Huntsman is out of the office today. Pt gave a verbal understanding and stated that it can wait until tomorrow.

## 2023-02-03 NOTE — Telephone Encounter (Signed)
Last filled by a historical provider Last OV: 01/23/2023 Next OV: 01/26/2024

## 2023-02-03 NOTE — Telephone Encounter (Signed)
I spoke to pt to schedule ERCP and has been scheduled for 8/15... instructions released to Mychart

## 2023-02-04 ENCOUNTER — Other Ambulatory Visit: Payer: Self-pay

## 2023-02-04 MED ORDER — LOSARTAN POTASSIUM 50 MG PO TABS
50.0000 mg | ORAL_TABLET | Freq: Every day | ORAL | 1 refills | Status: DC
  Filled 2023-02-04 – 2023-04-29 (×3): qty 90, 90d supply, fill #0
  Filled 2023-07-28: qty 90, 90d supply, fill #1

## 2023-02-04 NOTE — Telephone Encounter (Signed)
Pt is aware and gave a verbal understanding.  

## 2023-02-06 ENCOUNTER — Ambulatory Visit: Payer: BC Managed Care – PPO

## 2023-02-06 ENCOUNTER — Encounter
Admission: RE | Disposition: A | Discharge status: Home / Self Care | Payer: Self-pay | Source: Home / Self Care | Attending: Gastroenterology

## 2023-02-06 ENCOUNTER — Encounter: Payer: Self-pay | Admitting: Gastroenterology

## 2023-02-06 ENCOUNTER — Ambulatory Visit
Admission: RE | Admit: 2023-02-06 | Discharge: 2023-02-06 | Disposition: A | Discharge status: Home / Self Care | Payer: BC Managed Care – PPO | Attending: Gastroenterology | Admitting: Gastroenterology

## 2023-02-06 ENCOUNTER — Ambulatory Visit: Payer: BC Managed Care – PPO | Admitting: Registered Nurse

## 2023-02-06 DIAGNOSIS — K8050 Calculus of bile duct without cholangitis or cholecystitis without obstruction: Secondary | ICD-10-CM | POA: Diagnosis not present

## 2023-02-06 DIAGNOSIS — K838 Other specified diseases of biliary tract: Secondary | ICD-10-CM | POA: Diagnosis not present

## 2023-02-06 HISTORY — PX: ENDOSCOPIC RETROGRADE CHOLANGIOPANCREATOGRAPHY (ERCP) WITH PROPOFOL: SHX5810

## 2023-02-06 HISTORY — PX: REMOVAL OF STONES: SHX5545

## 2023-02-06 SURGERY — ENDOSCOPIC RETROGRADE CHOLANGIOPANCREATOGRAPHY (ERCP) WITH PROPOFOL
Anesthesia: General

## 2023-02-06 MED ORDER — LACTATED RINGERS IV SOLN
INTRAVENOUS | Status: DC
  Administered 2023-02-06: 1000 mL via INTRAVENOUS

## 2023-02-06 MED ORDER — DEXMEDETOMIDINE HCL IN NACL 80 MCG/20ML IV SOLN
INTRAVENOUS | Status: DC | PRN
  Administered 2023-02-06: 12 ug via INTRAVENOUS

## 2023-02-06 MED ORDER — PROPOFOL 10 MG/ML IV BOLUS
INTRAVENOUS | Status: DC | PRN
  Administered 2023-02-06: 70 mg via INTRAVENOUS

## 2023-02-06 MED ORDER — SODIUM CHLORIDE 0.9 % IV SOLN: INTRAVENOUS | Status: DC

## 2023-02-06 MED ORDER — PROPOFOL 500 MG/50ML IV EMUL
INTRAVENOUS | Status: DC | PRN
  Administered 2023-02-06: 140 ug/kg/min via INTRAVENOUS

## 2023-02-06 MED ORDER — DIPHENHYDRAMINE HCL 50 MG/ML IJ SOLN
INTRAMUSCULAR | Status: AC
  Filled 2023-02-06: qty 1

## 2023-02-06 MED ORDER — DIPHENHYDRAMINE HCL 50 MG/ML IJ SOLN
50.0000 mg | Freq: Once | INTRAMUSCULAR | Status: AC
  Administered 2023-02-06: 50 mg via INTRAVENOUS

## 2023-02-06 MED ORDER — LIDOCAINE HCL (CARDIAC) PF 100 MG/5ML IV SOSY
PREFILLED_SYRINGE | INTRAVENOUS | Status: DC | PRN
  Administered 2023-02-06: 100 mg via INTRAVENOUS

## 2023-02-06 NOTE — Op Note (Signed)
[facility] Gastroenterology Patient Name: Kathryn Torres Procedure Date: 02/06/2023 9:17 AM MRN: [id number] Account #: [id number] Date of Birth: Sep 09, 1960 Admit Type: Outpatient Age: 62 Room: [facility] ENDO ROOM 4 Gender: Female Note Status: Finalized Instrument Name: Nolon Stalls [id number] Procedure:             ERCP Indications:           Common bile duct stone(s) Providers:             Midge Minium MD, MD Referring MD:          Duncan Dull, MD (Referring MD) Medicines:             Propofol per Anesthesia Complications:         No immediate complications. Procedure:             Pre-Anesthesia Assessment:                        - Prior to the procedure, a History and Physical was                         performed, and patient medications and allergies were                         reviewed. The patient's tolerance of previous                         anesthesia was also reviewed. The risks and benefits                         of the procedure and the sedation options and risks                         were discussed with the patient. All questions were                         answered, and informed consent was obtained. Prior                         Anticoagulants: The patient has taken no anticoagulant                         or antiplatelet agents except for aspirin. ASA Grade                         Assessment: II - A patient with mild systemic disease.                         After reviewing the risks and benefits, the patient                         was deemed in satisfactory condition to undergo the                         procedure.                        After obtaining informed consent, the scope was passed  under direct vision. Throughout the procedure, the                         patient's blood pressure, pulse, and oxygen                         saturations were monitored continuously. The                         Duodenoscope was introduced  through the mouth, and                         used to inject contrast into and used to inject                         contrast into the bile duct. The ERCP was accomplished                         without difficulty. The patient tolerated the                         procedure well. Findings:      The scout film was normal. A biliary sphincterotomy had been performed.       The sphincterotomy appeared open. The bile duct was deeply cannulated       with the short-nosed traction sphincterotome. Contrast was injected. I       personally interpreted the bile duct images. There was brisk flow of       contrast through the ducts. Image quality was excellent. Contrast       extended to the entire biliary tree. The main bile duct was markedly       dilated and diffusely dilated. The lower third of the main bile duct       contained multiple stones. Dilation of major papilla with a 12-13.5-15       mm balloon (to a maximum balloon size of 13.5 mm) dilator was       successful. Lithotripsy with the stone retrieval basket and the       [place] was unsuccessful because the stone could not be trapped. The       biliary tree was swept with a 15 mm balloon starting at the bifurcation.       Sludge was swept from the duct. All stones were removed. Nothing was       found. Impression:            - Prior biliary sphincterotomy appeared open.                        - The entire main bile duct was markedly dilated.                        - Choledocholithiasis was found. Complete removal was                         accomplished by balloon extraction.                        - Major papilla was successfully dilated.                        -  Lithotripsy was unsuccessful because the stone could                         not be trapped.                        - The biliary tree was swept and nothing was found. Recommendation:        - Discharge patient to home.                        - Resume previous diet.                         - Continue present medications. Procedure Code(s):     --- Professional ---                        [phone], 59, Endoscopic retrograde                         cholangiopancreatography (ERCP); with trans-endoscopic                         balloon dilation of biliary/pancreatic duct(s) or of                         ampulla (sphincteroplasty), including sphincterotomy,                         when performed, each duct                        43264, Endoscopic retrograde cholangiopancreatography                         (ERCP); with removal of calculi/debris from                         biliary/pancreatic duct(s)                        60454, Endoscopic catheterization of the biliary                         ductal system, radiological supervision and                         interpretation Diagnosis Code(s):     --- Professional ---                        K80.50, Calculus of bile duct without cholangitis or                         cholecystitis without obstruction                        K83.8, Other specified diseases of biliary tract CPT copyright 2022 American Medical Association. All rights reserved. The codes documented in this report are preliminary and upon coder review may  be revised to meet current compliance requirements. Midge Minium MD, MD 02/06/2023 10:56:25 AM This report has been signed electronically. Number of Addenda: 0 Note Initiated On: 02/06/2023  9:17 AM Estimated Blood Loss:  Estimated blood loss: none.      [facility]

## 2023-02-06 NOTE — Transfer of Care (Signed)
Immediate Anesthesia Transfer of Care Note  Patient: Kathryn Torres  Procedure(s) Performed: ENDOSCOPIC RETROGRADE CHOLANGIOPANCREATOGRAPHY (ERCP) WITH PROPOFOL  Patient Location: PACU  Anesthesia Type:General  Level of Consciousness: awake, alert , and oriented  Airway & Oxygen Therapy: Patient Spontanous Breathing  Post-op Assessment: Report given to RN and Post -op Vital signs reviewed and stable  Post vital signs: Reviewed and stable  Last Vitals:  Vitals Value Taken Time  BP 109/58 02/06/23 1121  Temp    Pulse 78 02/06/23 1122  Resp 12 02/06/23 1122  SpO2 100 % 02/06/23 1122  Vitals shown include unfiled device data.  Last Pain:  Vitals:   02/06/23 1101  TempSrc:   PainSc: 0-No pain         Complications: No notable events documented.

## 2023-02-06 NOTE — Anesthesia Postprocedure Evaluation (Signed)
Anesthesia Post Note  Patient: Kathryn Torres  Procedure(s) Performed: ENDOSCOPIC RETROGRADE CHOLANGIOPANCREATOGRAPHY (ERCP) WITH PROPOFOL  Patient location during evaluation: Endoscopy Anesthesia Type: General Level of consciousness: awake and alert Pain management: pain level controlled Vital Signs Assessment: post-procedure vital signs reviewed and stable Respiratory status: spontaneous breathing, nonlabored ventilation, respiratory function stable and patient connected to nasal cannula oxygen Cardiovascular status: blood pressure returned to baseline and stable Postop Assessment: no apparent nausea or vomiting Anesthetic complications: no   No notable events documented.   Last Vitals:  Vitals:   02/06/23 1101 02/06/23 1108  BP: 128/74 (!) 109/58  Pulse:    Resp:  16  Temp:  (!) 36.1 C  SpO2:      Last Pain:  Vitals:   02/06/23 1123  TempSrc:   PainSc: 0-No pain                 Louie Boston

## 2023-02-06 NOTE — Anesthesia Preprocedure Evaluation (Signed)
Anesthesia Evaluation  Patient identified by MRN, date of birth, ID band Patient awake    Reviewed: Allergy & Precautions, NPO status , Patient's Chart, lab work & pertinent test results  History of Anesthesia Complications Negative for: history of anesthetic complications  Airway Mallampati: II  TM Distance: >3 FB Neck ROM: full    Dental no notable dental hx.    Pulmonary neg pulmonary ROS   Pulmonary exam normal        Cardiovascular hypertension, On Medications negative cardio ROS Normal cardiovascular exam     Neuro/Psych negative neurological ROS  negative psych ROS   GI/Hepatic negative GI ROS, Neg liver ROS,,,  Endo/Other  negative endocrine ROS    Renal/GU negative Renal ROS  negative genitourinary   Musculoskeletal   Abdominal   Peds  Hematology negative hematology ROS (+)   Anesthesia Other Findings Past Medical History: 12/18/2022: AKI (acute kidney injury) (HCC) No date: Hypertension 03/05/2019: Lower respiratory tract infection due to COVID-19 virus 2006: melanoma     Comment:  lower back 2002: Previous pregnancy with hemolysis, elevated liver enzymes, and  low platelet (HELLP) syndrome, antepartum     Comment:  s/p c section   Past Surgical History: 12/18/2022: BALLOON DILATION     Comment:  Procedure: BALLOON DILATION;  Surgeon: Midge Minium, MD;              Location: [facility] ENDOSCOPY;  Service: Endoscopy;; 2005: BREAST BIOPSY; Right     Comment:  benign 1998: BREAST EXCISIONAL BIOPSY; Right No date: CATARACT EXTRACTION BILATERAL W/ ANTERIOR VITRECTOMY;  Bilateral     Comment:  Illene Bolus,  2017 2002: CESAREAN SECTION No date: CHOLECYSTECTOMY 12/18/2022: ENDOSCOPIC RETROGRADE CHOLANGIOPANCREATOGRAPHY (ERCP)  WITH PROPOFOL; N/A     Comment:  Procedure: ENDOSCOPIC RETROGRADE               CHOLANGIOPANCREATOGRAPHY (ERCP) WITH PROPOFOL;  Surgeon:               Midge Minium, MD;   Location: [facility] ENDOSCOPY;  Service:               Endoscopy;  Laterality: N/A; 12/18/2022: REMOVAL OF STONES     Comment:  Procedure: REMOVAL OF STONES;  Surgeon: Midge Minium,               MD;  Location: [facility] ENDOSCOPY;  Service: Endoscopy;; 12/18/2022: SPHINCTEROTOMY     Comment:  Procedure: SPHINCTEROTOMY;  Surgeon: Midge Minium, MD;                Location: [facility] ENDOSCOPY;  Service: Endoscopy;;  BMI    Body Mass Index: 35.53 kg/m      Reproductive/Obstetrics negative OB ROS                              Anesthesia Physical Anesthesia Plan  ASA: 2  Anesthesia Plan: General   Post-op Pain Management: Minimal or no pain anticipated   Induction: Intravenous  PONV Risk Score and Plan: 2 and Propofol infusion and TIVA  Airway Management Planned: Natural Airway and Nasal Cannula  Additional Equipment:   Intra-op Plan:   Post-operative Plan:   Informed Consent: I have reviewed the patients History and Physical, chart, labs and discussed the procedure including the risks, benefits and alternatives for the proposed anesthesia with the patient or authorized representative who has indicated his/her understanding and acceptance.     Dental Advisory Given  Plan Discussed with:  Anesthesiologist, CRNA and Surgeon  Anesthesia Plan Comments: (Patient consented for risks of anesthesia including but not limited to:  - adverse reactions to medications - risk of airway placement if required - damage to eyes, teeth, lips or other oral mucosa - nerve damage due to positioning  - sore throat or hoarseness - Damage to heart, brain, nerves, lungs, other parts of body or loss of life  Patient voiced understanding.)         Anesthesia Quick Evaluation

## 2023-02-06 NOTE — H&P (Signed)
Midge Minium, MD [facility] [address], [address] [place], [place] [address] Phone:[phone] Fax : [phone]  Primary Care Physician:  Sherlene Shams, MD Primary Gastroenterologist:  Dr. Servando Snare  Pre-Procedure History & Physical: HPI:  Kathryn Torres is a 62 y.o. female is here for an ERCP.   Past Medical History:  Diagnosis Date   AKI (acute kidney injury) (HCC) 12/18/2022   Hypertension    Lower respiratory tract infection due to COVID-19 virus 03/05/2019   melanoma 2006   lower back   Previous pregnancy with hemolysis, elevated liver enzymes, and low platelet (HELLP) syndrome, antepartum 2002   s/p c section     Past Surgical History:  Procedure Laterality Date   BALLOON DILATION  12/18/2022   Procedure: BALLOON DILATION;  Surgeon: Midge Minium, MD;  Location: [facility] ENDOS[facility];  Service: Endoscopy;;   BREAST BIOPSY Right 2005   benign   BREAST EXCISIONAL BIOPSY Right 1998   CATARACT EXTRACTION BILATERAL W/ ANTERIOR VITRECTOMY Bilateral    Illene Bolus,  2017   CESAREAN SECTION  2002   CHOLECYSTECTOMY     ENDOSCOPIC RETROGRADE CHOLANGIOPANCREATOGRAPHY (ERCP) WITH PROPOFOL N/A 12/18/2022   Procedure: ENDOSCOPIC RETROGRADE CHOLANGIOPANCREATOGRAPHY (ERCP) WITH PROPOFOL;  Surgeon: Midge Minium, MD;  Location: [facility];  Service: Endoscopy;  Laterality: N/A;   REMOVAL OF STONES  12/18/2022   Procedure: REMOVAL OF STONES;  Surgeon: Midge Minium, MD;  Location: [facility];  Service: Endoscopy;;   SPHINCTEROTOMY  12/18/2022   Procedure: Dennison Mascot;  Surgeon: Midge Minium, MD;  Location: [facility] ENDOSCOPY;  Service: Endoscopy;;    Prior to Admission medications   Medication Sig Start Date End Date Taking? Authorizing Provider  cholecalciferol (VITAMIN D) 1000 units tablet Take 1,000 Units by mouth daily.   Yes [provider]  folic acid (FOLVITE) 1 MG tablet Take 1 mg by mouth daily.   Yes [provider]  losartan (COZAAR) 50 MG tablet Take 1 tablet  (50 mg total) by mouth daily. 10/21/22 10/21/23 Yes Sherlene Shams, MD  metoprolol succinate (TOPROL-XL) 25 MG 24 hr tablet Take 1 tablet (25 mg total) by mouth daily. 01/23/23 01/23/24 Yes Sherlene Shams, MD  ondansetron (ZOFRAN-ODT) 8 MG disintegrating tablet Take 1 tablet (8 mg total) by mouth every 8 (eight) hours as needed for nausea or vomiting. 12/20/22  Yes Esaw Grandchild A, DO  vitamin B-12 (CYANOCOBALAMIN) 1000 MCG tablet Take 1 tablet (1,000 mcg total) by mouth daily. 01/15/22  Yes Sherlene Shams, MD  acetaminophen (TYLENOL) 500 MG tablet Take 2 tablets (1,000 mg total) by mouth every 6 (six) hours. Patient not taking: Reported on 01/23/2023 12/20/22   Esaw Grandchild A, DO  ibuprofen (ADVIL) 400 MG tablet Take 1-1.5 tablets (400-600 mg total) by mouth every 6 (six) hours as needed for moderate pain. Patient not taking: Reported on 01/23/2023 12/20/22   Esaw Grandchild A, DO  losartan (COZAAR) 50 MG tablet Take 1 tablet (50 mg total) by mouth daily. 02/04/23   Sherlene Shams, MD    Allergies as of 02/03/2023 - Review Complete 01/23/2023  Allergen Reaction Noted   Contrast media [iodinated contrast media]  11/14/2011    Family History  Problem Relation Age of Onset   Cancer Mother        Breast and Lung Ca, social smoker    Hypertension Mother    Breast cancer Mother 85   Cancer Father        Lung Ca, smoker   Stroke Paternal Grandfather 20  CVA ,     Deep vein thrombosis Maternal Grandmother     Social History   Socioeconomic History   Marital status: Married    Spouse name: Not on file   Number of children: Not on file   Years of education: Not on file   Highest education level: Not on file  Occupational History   Not on file  Tobacco Use   Smoking status: Never   Smokeless tobacco: Never  Vaping Use   Vaping status: Never Used  Substance and Sexual Activity   Alcohol use: No   Drug use: No   Sexual activity: Yes    Birth control/protection: Surgical  Other  Topics Concern   Not on file  Social History Narrative   Independent at baseline.   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 One 59 year old daughter   Social Determinants of Corporate investment banker Strain: Not on file  Food Insecurity: No Food Insecurity (12/18/2022)   Hunger Vital Sign    Worried About Running Out of Food in the Last Year: Never true    Ran Out of Food in the Last Year: Never true  Transportation Needs: No Transportation Needs (12/18/2022)   PRAPARE - Administrator, Civil Service (Medical): No    Lack of Transportation (Non-Medical): No  Physical Activity: Not on file  Stress: Not on file  Social Connections: Not on file  Intimate Partner Violence: Not At Risk (12/18/2022)   Humiliation, Afraid, Rape, and Kick questionnaire    Fear of Current or Ex-Partner: No    Emotionally Abused: No    Physically Abused: No    Sexually Abused: No    Review of Systems: See HPI, otherwise negative ROS  Physical Exam: BP (!) 160/75   Pulse 82   Temp (!) 96.4 F (35.8 C) (Temporal)   Resp 18   Ht 5\' 4"  (1.626 m)   Wt 93.9 kg   LMP 06/14/2016 (Exact Date)   SpO2 100%   BMI 35.53 kg/m  General:   Alert,  pleasant and cooperative in NAD Head:  Normocephalic and atraumatic. Neck:  Supple; no masses or thyromegaly. Lungs:  Clear throughout to auscultation.    Heart:  Regular rate and rhythm. Abdomen:  Soft, nontender and nondistended. Normal bowel sounds, without guarding, and without rebound.   Neurologic:  Alert and  oriented x4;  grossly normal neurologically.  Impression/Plan: Kathryn Torres is here for an ERCP to be performed for CBD stone  Risks, benefits, limitations, and alternatives regarding  ERCP have been reviewed with the patient.  Questions have been answered.  All parties agreeable.   Midge Minium, MD  02/06/2023, 9:28 AM

## 2023-02-07 ENCOUNTER — Encounter: Payer: Self-pay | Admitting: Gastroenterology

## 2023-04-18 ENCOUNTER — Other Ambulatory Visit: Payer: Self-pay

## 2023-04-29 ENCOUNTER — Other Ambulatory Visit: Payer: Self-pay

## 2023-09-03 ENCOUNTER — Other Ambulatory Visit: Payer: Self-pay | Admitting: Internal Medicine

## 2023-09-03 DIAGNOSIS — Z1231 Encounter for screening mammogram for malignant neoplasm of breast: Secondary | ICD-10-CM

## 2023-10-09 ENCOUNTER — Ambulatory Visit
Admission: RE | Admit: 2023-10-09 | Discharge: 2023-10-09 | Disposition: A | Discharge status: Home / Self Care | Payer: Self-pay | Source: Ambulatory Visit | Attending: Internal Medicine | Admitting: Internal Medicine

## 2023-10-09 DIAGNOSIS — Z1231 Encounter for screening mammogram for malignant neoplasm of breast: Secondary | ICD-10-CM | POA: Insufficient documentation

## 2023-10-19 ENCOUNTER — Other Ambulatory Visit: Payer: Self-pay | Admitting: Internal Medicine

## 2023-10-20 ENCOUNTER — Other Ambulatory Visit: Payer: Self-pay

## 2023-10-20 MED ORDER — METOPROLOL SUCCINATE ER 25 MG PO TB24
25.0000 mg | ORAL_TABLET | Freq: Every day | ORAL | 1 refills | Status: DC
  Filled 2023-10-20: qty 90, 90d supply, fill #0
  Filled 2024-01-15: qty 90, 90d supply, fill #1

## 2023-10-20 MED ORDER — LOSARTAN POTASSIUM 50 MG PO TABS
50.0000 mg | ORAL_TABLET | Freq: Every day | ORAL | 1 refills | Status: DC
  Filled 2023-10-20: qty 90, 90d supply, fill #0
  Filled 2024-01-21: qty 90, 90d supply, fill #1

## 2024-01-26 ENCOUNTER — Ambulatory Visit (INDEPENDENT_AMBULATORY_CARE_PROVIDER_SITE_OTHER): Payer: BC Managed Care – PPO | Admitting: Internal Medicine

## 2024-01-26 ENCOUNTER — Other Ambulatory Visit: Payer: Self-pay

## 2024-01-26 ENCOUNTER — Encounter: Payer: Self-pay | Admitting: Internal Medicine

## 2024-01-26 VITALS — BP 120/82 | HR 94 | Temp 98.0°F | Ht 64.0 in | Wt 220.8 lb

## 2024-01-26 DIAGNOSIS — E782 Mixed hyperlipidemia: Secondary | ICD-10-CM

## 2024-01-26 DIAGNOSIS — E559 Vitamin D deficiency, unspecified: Secondary | ICD-10-CM

## 2024-01-26 DIAGNOSIS — I10 Essential (primary) hypertension: Secondary | ICD-10-CM

## 2024-01-26 DIAGNOSIS — Z0001 Encounter for general adult medical examination with abnormal findings: Secondary | ICD-10-CM

## 2024-01-26 DIAGNOSIS — E538 Deficiency of other specified B group vitamins: Secondary | ICD-10-CM

## 2024-01-26 DIAGNOSIS — Z1211 Encounter for screening for malignant neoplasm of colon: Secondary | ICD-10-CM | POA: Diagnosis not present

## 2024-01-26 DIAGNOSIS — E669 Obesity, unspecified: Secondary | ICD-10-CM | POA: Diagnosis not present

## 2024-01-26 DIAGNOSIS — Z0000 Encounter for general adult medical examination without abnormal findings: Secondary | ICD-10-CM

## 2024-01-26 DIAGNOSIS — R5383 Other fatigue: Secondary | ICD-10-CM | POA: Diagnosis not present

## 2024-01-26 DIAGNOSIS — R635 Abnormal weight gain: Secondary | ICD-10-CM

## 2024-01-26 DIAGNOSIS — K8050 Calculus of bile duct without cholangitis or cholecystitis without obstruction: Secondary | ICD-10-CM

## 2024-01-26 DIAGNOSIS — R7301 Impaired fasting glucose: Secondary | ICD-10-CM

## 2024-01-26 DIAGNOSIS — Z9049 Acquired absence of other specified parts of digestive tract: Secondary | ICD-10-CM

## 2024-01-26 LAB — CBC WITH DIFFERENTIAL/PLATELET
Basophils Absolute: 0 K/uL (ref 0.0–0.1)
Basophils Relative: 0.5 % (ref 0.0–3.0)
Eosinophils Absolute: 0.1 K/uL (ref 0.0–0.7)
Eosinophils Relative: 1 % (ref 0.0–5.0)
HCT: 42.5 % (ref 36.0–46.0)
Hemoglobin: 14.2 g/dL (ref 12.0–15.0)
Lymphocytes Relative: 18.5 % (ref 12.0–46.0)
Lymphs Abs: 1.2 K/uL (ref 0.7–4.0)
MCHC: 33.3 g/dL (ref 30.0–36.0)
MCV: 82.2 fl (ref 78.0–100.0)
Monocytes Absolute: 0.4 K/uL (ref 0.1–1.0)
Monocytes Relative: 5.5 % (ref 3.0–12.0)
Neutro Abs: 5 K/uL (ref 1.4–7.7)
Neutrophils Relative %: 74.5 % (ref 43.0–77.0)
Platelets: 301 K/uL (ref 150.0–400.0)
RBC: 5.17 Mil/uL — ABNORMAL HIGH (ref 3.87–5.11)
RDW: 13.9 % (ref 11.5–15.5)
WBC: 6.7 K/uL (ref 4.0–10.5)

## 2024-01-26 LAB — COMPREHENSIVE METABOLIC PANEL WITH GFR
ALT: 22 U/L (ref 0–35)
AST: 18 U/L (ref 0–37)
Albumin: 4.5 g/dL (ref 3.5–5.2)
Alkaline Phosphatase: 98 U/L (ref 39–117)
BUN: 15 mg/dL (ref 6–23)
CO2: 29 meq/L (ref 19–32)
Calcium: 9.8 mg/dL (ref 8.4–10.5)
Chloride: 102 meq/L (ref 96–112)
Creatinine, Ser: 0.83 mg/dL (ref 0.40–1.20)
GFR: 76.26 mL/min (ref >60.00)
Glucose, Bld: 100 mg/dL — ABNORMAL HIGH (ref 70–99)
Potassium: 4.9 meq/L (ref 3.5–5.1)
Sodium: 139 meq/L (ref 135–145)
Total Bilirubin: 0.8 mg/dL (ref 0.2–1.2)
Total Protein: 7.3 g/dL (ref 6.0–8.3)

## 2024-01-26 LAB — B12 AND FOLATE PANEL
Folate: 10.7 ng/mL (ref >5.9)
Vitamin B-12: 280 pg/mL (ref 211–911)

## 2024-01-26 LAB — LIPID PANEL
Cholesterol: 197 mg/dL (ref 0–200)
HDL: 59.7 mg/dL (ref >39.00)
LDL Cholesterol: 115 mg/dL — ABNORMAL HIGH (ref 0–99)
NonHDL: 136.87
Total CHOL/HDL Ratio: 3
Triglycerides: 108 mg/dL (ref 0.0–149.0)
VLDL: 21.6 mg/dL (ref 0.0–40.0)

## 2024-01-26 LAB — MICROALBUMIN / CREATININE URINE RATIO
Creatinine,U: 165.6 mg/dL
Microalb Creat Ratio: 5.2 mg/g (ref 0.0–30.0)
Microalb, Ur: 0.9 mg/dL (ref 0.0–1.9)

## 2024-01-26 LAB — TSH: TSH: 1 u[IU]/mL (ref 0.35–5.50)

## 2024-01-26 LAB — HEMOGLOBIN A1C: Hgb A1c MFr Bld: 5.8 % (ref 4.6–6.5)

## 2024-01-26 LAB — LDL CHOLESTEROL, DIRECT: Direct LDL: 117 mg/dL

## 2024-01-26 LAB — VITAMIN D 25 HYDROXY (VIT D DEFICIENCY, FRACTURES): VITD: 23.81 ng/mL — ABNORMAL LOW (ref 30.00–100.00)

## 2024-01-26 MED ORDER — LOSARTAN POTASSIUM 50 MG PO TABS
50.0000 mg | ORAL_TABLET | Freq: Every day | ORAL | 1 refills | Status: DC
  Filled 2024-01-26 – 2024-04-19 (×2): qty 90, 90d supply, fill #0
  Filled 2024-07-24: qty 90, 90d supply, fill #1

## 2024-01-26 MED ORDER — METOPROLOL SUCCINATE ER 25 MG PO TB24
25.0000 mg | ORAL_TABLET | Freq: Every day | ORAL | 1 refills | Status: AC
  Filled 2024-01-26 – 2024-04-19 (×2): qty 90, 90d supply, fill #0
  Filled 2024-07-24: qty 90, 90d supply, fill #1

## 2024-01-26 NOTE — Patient Instructions (Signed)
 Focus on Portion reduction and  getting  30 minutes of aerobic exercise daily   Healthy Choice low carb power bowl  entrees and  Steamer entrees are are great low carb entrees that microwave in 5 minutes     For individuals who have documented receipt of  two doses of MMR  the CDC does not required an antibody titer to be measured for evidence of measles immunity.  Fewer than 1% of immunocompetent individuals  lack laboratory evidence of immunity  after two doses of MMR

## 2024-01-26 NOTE — Progress Notes (Unsigned)
 Patient ID: Kathryn Torres, female    DOB: January 16, 1961  Age: 63 y.o. MRN: [id number]  The patient is here for annual preventive examination and management of other chronic and acute problems.   The risk factors are reflected in the social history.   The roster of all physicians providing medical care to patient - is listed in the Snapshot section of the chart.   Activities of daily living:  The patient is 100% independent in all ADLs: dressing, toileting, feeding as well as independent mobility   Home safety : The patient has smoke detectors in the home. They wear seatbelts.  There are no unsecured firearms at home. There is no violence in the home.    There is no risks for hepatitis, STDs or HIV. There is no   history of blood transfusion. They have no travel history to infectious disease endemic areas of the world.   The patient has seen their dentist in the last six month. They have seen their eye doctor in the last year. The patinet  denies slight hearing difficulty with regard to whispered voices and some television programs.  They have deferred audiologic testing in the last year.  They do not  have excessive sun exposure. Discussed the need for sun protection: hats, long sleeves and use of sunscreen if there is significant sun exposure.    Diet: the importance of a healthy diet is discussed. They do have a healthy diet.   The benefits of regular aerobic exercise were discussed. The patient  exercises  3 to 5 days per week  for  60 minutes.    Depression screen: there are no signs or vegative symptoms of depression- irritability, change in appetite, anhedonia, sadness/tearfullness.   The following portions of the patient's history were reviewed and updated as appropriate: allergies, current medications, past family history, past medical history,  past surgical history, past social history  and problem list.   Visual acuity was not assessed per patient preference since the patient has  regular follow up with an  ophthalmologist. Hearing and body mass index were assessed and reviewed.    During the course of the visit the patient was educated and counseled about appropriate screening and preventive services including : fall prevention , diabetes screening, nutrition counseling, colorectal cancer screening, and recommended immunizations.    Chief Complaint:      Review of Symptoms  Patient denies headache, fevers, malaise, unintentional weight loss, skin rash, eye pain, sinus congestion and sinus pain, sore throat, dysphagia,  hemoptysis , cough, dyspnea, wheezing, chest pain, palpitations, orthopnea, edema, abdominal pain, nausea, melena, diarrhea, constipation, flank pain, dysuria, hematuria, urinary  Frequency, nocturia, numbness, tingling, seizures,  Focal weakness, Loss of consciousness,  Tremor, insomnia, depression, anxiety, and suicidal ideation.    Physical Exam:  BP 120/82 (BP Location: Left Arm, Patient Position: Sitting, Cuff Size: Normal)   Pulse 94   Temp 98 F (36.7 C) (Oral)   Ht 5' 4 (1.626 m)   Wt 220 lb 12.8 oz (100.2 kg)   LMP 06/14/2016 (Exact Date)   SpO2 98%   BMI 37.90 kg/m    Physical Exam  Assessment and Plan: Primary hypertension  Hyperlipidemia, mixed  Encounter for preventive health examination  Impaired fasting glucose  Other fatigue  Colon cancer screening    No follow-ups on file.  [name], MD

## 2024-01-26 NOTE — Assessment & Plan Note (Signed)
 Weight gain noted despite elimination of sodas,  and increased activity.  Targetted counselling given regarding exercise goals and dietary choices.  Cortisol screening ordered

## 2024-01-27 ENCOUNTER — Ambulatory Visit

## 2024-01-27 ENCOUNTER — Other Ambulatory Visit

## 2024-01-27 ENCOUNTER — Encounter: Payer: Self-pay | Admitting: Internal Medicine

## 2024-01-27 ENCOUNTER — Ambulatory Visit: Payer: Self-pay | Admitting: Internal Medicine

## 2024-01-27 ENCOUNTER — Ambulatory Visit (INDEPENDENT_AMBULATORY_CARE_PROVIDER_SITE_OTHER)

## 2024-01-27 DIAGNOSIS — R635 Abnormal weight gain: Secondary | ICD-10-CM

## 2024-01-27 LAB — CORTISOL: Cortisol, Plasma: 8.3 ug/dL

## 2024-01-27 MED ORDER — VITAMIN B-12 1000 MCG PO TABS
1000.0000 ug | ORAL_TABLET | Freq: Every day | ORAL | 3 refills | Status: AC
  Filled 2024-01-27: qty 90, 90d supply, fill #0
  Filled 2024-04-24: qty 90, 90d supply, fill #1
  Filled 2024-07-24: qty 90, 90d supply, fill #2

## 2024-01-27 MED ORDER — ERGOCALCIFEROL 1.25 MG (50000 UT) PO CAPS
50000.0000 [IU] | ORAL_CAPSULE | ORAL | 0 refills | Status: DC
  Filled 2024-01-27: qty 12, 84d supply, fill #0

## 2024-01-27 NOTE — Addendum Note (Signed)
 Addended by: MARYLYNN VERNEITA CROME on: 01/27/2024 09:54 PM   Modules accepted: Orders

## 2024-01-27 NOTE — Telephone Encounter (Signed)
 When should pt have cortisol checked

## 2024-01-27 NOTE — Assessment & Plan Note (Signed)
 Recurrent, secondary to limited sun exposure given history of melanoma .SABRA  Continue supplementation and periodic assessment.

## 2024-01-27 NOTE — Assessment & Plan Note (Signed)
 10 yr risk is 4% using AHA risk calculator. No treatment advised except exercise and weight loss.   Lab Results  Component Value Date   CHOL 197 01/26/2024   HDL 59.70 01/26/2024   LDLCALC 115 (H) 01/26/2024   LDLDIRECT 117.0 01/26/2024   TRIG 108.0 01/26/2024   CHOLHDL 3 01/26/2024

## 2024-01-27 NOTE — Assessment & Plan Note (Signed)
 June 2024.   Retained stones requiring ERCP August 2024.

## 2024-01-27 NOTE — Assessment & Plan Note (Signed)
 Well controlled on current regimen of metoprolol  and losartan  . Renal function stable, no changes today.   Lab Results  Component Value Date   CREATININE 0.83 01/26/2024   Lab Results  Component Value Date   NA 139 01/26/2024   K 4.9 01/26/2024   CL 102 01/26/2024   CO2 29 01/26/2024

## 2024-01-27 NOTE — Assessment & Plan Note (Signed)

## 2024-01-27 NOTE — Assessment & Plan Note (Addendum)
 Found  by ERCP in August 2024 following a lap chole in June 2024. Liver enzymes have normalized.  NO history of pancreatitis   Lab Results  Component Value Date   ALT 22 01/26/2024   AST 18 01/26/2024   ALKPHOS 98 01/26/2024   BILITOT 0.8 01/26/2024

## 2024-01-28 ENCOUNTER — Other Ambulatory Visit: Payer: Self-pay

## 2024-03-10 ENCOUNTER — Other Ambulatory Visit: Payer: Self-pay

## 2024-03-10 MED ORDER — TERBINAFINE HCL 250 MG PO TABS
250.0000 mg | ORAL_TABLET | Freq: Every day | ORAL | 1 refills | Status: DC
  Filled 2024-03-10: qty 30, 30d supply, fill #0
  Filled 2024-04-05: qty 30, 30d supply, fill #1

## 2024-03-10 MED ORDER — MOMETASONE FUROATE 0.1 % EX CREA
TOPICAL_CREAM | CUTANEOUS | 1 refills | Status: AC
  Filled 2024-03-10: qty 45, 30d supply, fill #0
  Filled 2024-07-24: qty 45, 30d supply, fill #1

## 2024-04-19 ENCOUNTER — Other Ambulatory Visit: Payer: Self-pay

## 2024-04-20 ENCOUNTER — Other Ambulatory Visit: Payer: Self-pay

## 2024-04-20 MED ORDER — NA SULFATE-K SULFATE-MG SULF 17.5-3.13-1.6 GM/177ML PO SOLN
ORAL | 0 refills | Status: DC
  Filled 2024-04-20: qty 354, 1d supply, fill #0

## 2024-04-22 ENCOUNTER — Other Ambulatory Visit: Payer: Self-pay

## 2024-04-25 ENCOUNTER — Other Ambulatory Visit: Payer: Self-pay

## 2024-07-06 ENCOUNTER — Ambulatory Visit: Payer: Self-pay

## 2024-07-06 DIAGNOSIS — Z1211 Encounter for screening for malignant neoplasm of colon: Secondary | ICD-10-CM | POA: Diagnosis present

## 2024-07-06 DIAGNOSIS — D120 Benign neoplasm of cecum: Secondary | ICD-10-CM | POA: Diagnosis not present

## 2024-07-06 DIAGNOSIS — K640 First degree hemorrhoids: Secondary | ICD-10-CM | POA: Diagnosis not present

## 2024-07-06 DIAGNOSIS — D175 Benign lipomatous neoplasm of intra-abdominal organs: Secondary | ICD-10-CM | POA: Diagnosis not present

## 2024-07-26 ENCOUNTER — Other Ambulatory Visit: Payer: Self-pay

## 2024-07-28 ENCOUNTER — Encounter: Payer: Self-pay | Admitting: Internal Medicine

## 2024-07-28 ENCOUNTER — Ambulatory Visit: Admitting: Internal Medicine

## 2024-07-28 ENCOUNTER — Other Ambulatory Visit: Payer: Self-pay

## 2024-07-28 VITALS — BP 124/76 | HR 73 | Temp 98.4°F | Ht 63.5 in | Wt 221.6 lb

## 2024-07-28 DIAGNOSIS — E559 Vitamin D deficiency, unspecified: Secondary | ICD-10-CM

## 2024-07-28 DIAGNOSIS — I10 Essential (primary) hypertension: Secondary | ICD-10-CM

## 2024-07-28 DIAGNOSIS — Z23 Encounter for immunization: Secondary | ICD-10-CM

## 2024-07-28 DIAGNOSIS — E669 Obesity, unspecified: Secondary | ICD-10-CM

## 2024-07-28 MED ORDER — METOPROLOL SUCCINATE ER 25 MG PO TB24: 25.0000 mg | ORAL_TABLET | Freq: Every day | ORAL | 1 refills | Status: AC

## 2024-07-28 MED ORDER — LOSARTAN POTASSIUM 50 MG PO TABS: 50.0000 mg | ORAL_TABLET | Freq: Every day | ORAL | 1 refills | Status: AC

## 2024-07-28 NOTE — Assessment & Plan Note (Addendum)
 Well controlled on current regimen of metoprolol  and losartan  . Renal function is due , no changes today.   Lab Results  Component Value Date   CREATININE 0.83 01/26/2024   Lab Results  Component Value Date   NA 139 01/26/2024   K 4.9 01/26/2024   CL 102 01/26/2024   CO2 29 01/26/2024

## 2024-07-28 NOTE — Patient Instructions (Signed)
 Good to see you !  We did not make any med changes today bc your BP is fine  Please schedule a non fasting lab appointment sometime this month    See you in  August for your CPE

## 2024-07-28 NOTE — Assessment & Plan Note (Signed)
 Weight gain noted despite elimination of sodas,  and increased activity.  Targetted counselling given regarding exercise goals and dietary choices.

## 2024-07-28 NOTE — Progress Notes (Signed)
 "  Subjective:  Patient ID: Kathryn Torres, female    DOB: 10/24/1960  Age: 64 y.o. MRN: [id number]  CC: The primary encounter diagnosis was Vitamin D  deficiency. Diagnoses of Primary hypertension, Need for pneumococcal 20-valent conjugate vaccination, and Obesity (BMI 30-39.9) were also pertinent to this visit.   HPI [name] presents for  Chief Complaint  Patient presents with   Medical Management of Chronic Issues    Discuss hypertension   1) Hypertension: patient checks blood pressure twice weekly at home.  Readings have been for the most part <130/80 at rest . Patient is following a reduced salt diet most days and is taking medications as prescribed    2) OBESITY; she has been walking daily for exercise. Weight is stable     Outpatient Medications Prior to Visit  Medication Sig Dispense Refill   cyanocobalamin  (VITAMIN B12) 1000 MCG tablet Take 1 tablet (1,000 mcg total) by mouth daily. 90 tablet 3   acetaminophen  (TYLENOL ) 500 MG tablet Take 2 tablets (1,000 mg total) by mouth every 6 (six) hours. (Patient not taking: Reported on 01/23/2023) 30 tablet 0   ergocalciferol  (DRISDOL ) 1.25 MG (50000 UT) capsule Take 1 capsule (50,000 Units total) by mouth once a week. (Patient not taking: Reported on 07/28/2024) 12 capsule 0   folic acid  (FOLVITE ) 1 MG tablet Take 1 mg by mouth daily. (Patient not taking: Reported on 07/28/2024)     ibuprofen  (ADVIL ) 400 MG tablet Take 1-1.5 tablets (400-600 mg total) by mouth every 6 (six) hours as needed for moderate pain. (Patient not taking: Reported on 01/23/2023) 30 tablet 0   losartan  (COZAAR ) 50 MG tablet Take 1 tablet (50 mg total) by mouth daily. 90 tablet 1   metoprolol  succinate (TOPROL -XL) 25 MG 24 hr tablet Take 1 tablet (25 mg total) by mouth daily. 90 tablet 1   mometasone  (ELOCON ) 0.1 % cream Apply to the affected area around the nail every evening (Patient not taking: Reported on 07/28/2024) 45 g 1   Na Sulfate-K Sulfate-Mg Sulfate  concentrate (SUPREP) 17.5-3.13-1.6 GM/177ML SOLN Take both bottles at the times instructed by your provider. (Patient not taking: Reported on 07/28/2024) 354 mL 0   ondansetron  (ZOFRAN -ODT) 8 MG disintegrating tablet Take 1 tablet (8 mg total) by mouth every 8 (eight) hours as needed for nausea or vomiting. 20 tablet 0   terbinafine  (LAMISIL ) 250 MG tablet Take 1 tablet (250 mg total) by mouth daily. (Patient not taking: Reported on 07/28/2024) 30 tablet 1   No facility-administered medications prior to visit.    Review of Systems;  Patient denies headache, fevers, malaise, unintentional weight loss, skin rash, eye pain, sinus congestion and sinus pain, sore throat, dysphagia,  hemoptysis , cough, dyspnea, wheezing, chest pain, palpitations, orthopnea, edema, abdominal pain, nausea, melena, diarrhea, constipation, flank pain, dysuria, hematuria, urinary  Frequency, nocturia, numbness, tingling, seizures,  Focal weakness, Loss of consciousness,  Tremor, insomnia, depression, anxiety, and suicidal ideation.      Objective:  BP 124/76 (Cuff Size: Large)   Pulse 73   Temp 98.4 F (36.9 C)   Ht 5' 3.5 (1.613 m)   Wt 221 lb 9.6 oz (100.5 kg)   LMP 06/14/2016   SpO2 98%   BMI 38.64 kg/m   BP Readings from Last 3 Encounters:  07/28/24 124/76  01/26/24 120/82  02/06/23 110/70    Wt Readings from Last 3 Encounters:  07/28/24 221 lb 9.6 oz (100.5 kg)  01/26/24 220 lb 12.8 oz (100.2  kg)  02/06/23 207 lb (93.9 kg)    Physical Exam Vitals reviewed.  Constitutional:      General: She is not in acute distress.    Appearance: Normal appearance. She is normal weight. She is not ill-appearing, toxic-appearing or diaphoretic.  HENT:     Head: Normocephalic.  Eyes:     General: No scleral icterus.       Right eye: No discharge.        Left eye: No discharge.     Conjunctiva/sclera: Conjunctivae normal.  Cardiovascular:     Rate and Rhythm: Normal rate and regular rhythm.     Heart sounds:  Normal heart sounds.  Pulmonary:     Effort: Pulmonary effort is normal. No respiratory distress.     Breath sounds: Normal breath sounds.  Musculoskeletal:        General: Normal range of motion.  Skin:    General: Skin is warm and dry.  Neurological:     General: No focal deficit present.     Mental Status: She is alert and oriented to person, place, and time. Mental status is at baseline.  Psychiatric:        Mood and Affect: Mood normal.        Behavior: Behavior normal.        Thought Content: Thought content normal.        Judgment: Judgment normal.     Lab Results  Component Value Date   HGBA1C 5.8 01/26/2024   HGBA1C 5.4 01/23/2023   HGBA1C 5.6 01/14/2022    Lab Results  Component Value Date   CREATININE 0.83 01/26/2024   CREATININE 0.77 01/23/2023   CREATININE 0.74 12/21/2022    Lab Results  Component Value Date   WBC 6.7 01/26/2024   HGB 14.2 01/26/2024   HCT 42.5 01/26/2024   PLT 301.0 01/26/2024   GLUCOSE 100 (H) 01/26/2024   CHOL 197 01/26/2024   TRIG 108.0 01/26/2024   HDL 59.70 01/26/2024   LDLDIRECT 117.0 01/26/2024   LDLCALC 115 (H) 01/26/2024   ALT 22 01/26/2024   AST 18 01/26/2024   NA 139 01/26/2024   K 4.9 01/26/2024   CL 102 01/26/2024   CREATININE 0.83 01/26/2024   BUN 15 01/26/2024   CO2 29 01/26/2024   TSH 1.00 01/26/2024   HGBA1C 5.8 01/26/2024   MICROALBUR 0.9 01/26/2024    MM 3D SCREENING MAMMOGRAM BILATERAL BREAST Result Date: 10/13/2023 CLINICAL DATA:  Screening. EXAM: DIGITAL SCREENING BILATERAL MAMMOGRAM WITH TOMOSYNTHESIS AND CAD TECHNIQUE: Bilateral screening digital craniocaudal and mediolateral oblique mammograms were obtained. Bilateral screening digital breast tomosynthesis was performed. The images were evaluated with computer-aided detection. COMPARISON:  Previous exam(s). ACR Breast Density Category b: There are scattered areas of fibroglandular density. FINDINGS: There are no findings suspicious for malignancy.  IMPRESSION: No mammographic evidence of malignancy. A result letter of this screening mammogram will be mailed directly to the patient. RECOMMENDATION: Screening mammogram in one year. (Code:SM-B-01Y) BI-RADS CATEGORY  1: Negative. Electronically Signed   By: Leita Mattocks M.D.   On: 10/13/2023 16:01    Assessment & Plan:   Problem List Items Addressed This Visit     Hypertension   Well controlled on current regimen of metoprolol  and losartan  . Renal function is due , no changes today.   Lab Results  Component Value Date   CREATININE 0.83 01/26/2024   Lab Results  Component Value Date   NA 139 01/26/2024   K 4.9 01/26/2024   CL  102 01/26/2024   CO2 29 01/26/2024         Relevant Medications   losartan  (COZAAR ) 50 MG tablet   metoprolol  succinate (TOPROL -XL) 25 MG 24 hr tablet   Other Relevant Orders   Comp Met (CMET)   Obesity (BMI 30-39.9) (Chronic)   Weight gain noted despite elimination of sodas,  and increased activity.  Targetted counselling given regarding exercise goals and dietary choices.        Vitamin D  deficiency - Primary   Relevant Orders   VITAMIN D  25 Hydroxy (Vit-D Deficiency, Fractures)   Other Visit Diagnoses       Need for pneumococcal 20-valent conjugate vaccination       Relevant Orders   Pneumococcal conjugate vaccine 20-valent (Prevnar 20) (Completed)         Follow-up: Return in about 6 months (around 01/25/2025) for physical.   [name], MD "

## 2024-07-29 ENCOUNTER — Other Ambulatory Visit: Payer: Self-pay

## 2024-07-30 ENCOUNTER — Other Ambulatory Visit: Payer: Self-pay

## 2024-08-03 ENCOUNTER — Other Ambulatory Visit

## 2025-01-28 ENCOUNTER — Encounter: Admitting: Internal Medicine
